# Patient Record
Sex: Male | Born: 1935 | Race: White | Hispanic: No | Marital: Married | Smoking: Former smoker
Health system: Southern US, Community
[De-identification: ages and names within clinical notes are randomized; demographics above are authoritative.]

## PROBLEM LIST (undated history)

## (undated) DIAGNOSIS — S72009A Fracture of unspecified part of neck of unspecified femur, initial encounter for closed fracture: Secondary | ICD-10-CM

## (undated) DIAGNOSIS — M1990 Unspecified osteoarthritis, unspecified site: Secondary | ICD-10-CM

## (undated) DIAGNOSIS — F329 Major depressive disorder, single episode, unspecified: Secondary | ICD-10-CM

## (undated) DIAGNOSIS — D649 Anemia, unspecified: Secondary | ICD-10-CM

## (undated) DIAGNOSIS — F32A Depression, unspecified: Secondary | ICD-10-CM

## (undated) DIAGNOSIS — I639 Cerebral infarction, unspecified: Secondary | ICD-10-CM

## (undated) DIAGNOSIS — E785 Hyperlipidemia, unspecified: Secondary | ICD-10-CM

## (undated) HISTORY — PX: HERNIA REPAIR: SHX51

---

## 2013-03-08 ENCOUNTER — Encounter (HOSPITAL_COMMUNITY): Payer: Self-pay | Admitting: Internal Medicine

## 2013-03-08 ENCOUNTER — Inpatient Hospital Stay (HOSPITAL_COMMUNITY)
Admission: AD | Admit: 2013-03-08 | Discharge: 2013-03-12 | DRG: 481 | Disposition: A | Discharge status: Skilled Nursing Facility | Payer: Medicare Other | Source: Other Acute Inpatient Hospital | Attending: Internal Medicine | Admitting: Internal Medicine

## 2013-03-08 DIAGNOSIS — S72142A Displaced intertrochanteric fracture of left femur, initial encounter for closed fracture: Secondary | ICD-10-CM

## 2013-03-08 DIAGNOSIS — S72142D Displaced intertrochanteric fracture of left femur, subsequent encounter for closed fracture with routine healing: Secondary | ICD-10-CM

## 2013-03-08 DIAGNOSIS — Z79899 Other long term (current) drug therapy: Secondary | ICD-10-CM

## 2013-03-08 DIAGNOSIS — W19XXXA Unspecified fall, initial encounter: Secondary | ICD-10-CM | POA: Diagnosis present

## 2013-03-08 DIAGNOSIS — S72142S Displaced intertrochanteric fracture of left femur, sequela: Secondary | ICD-10-CM

## 2013-03-08 DIAGNOSIS — D72829 Elevated white blood cell count, unspecified: Secondary | ICD-10-CM

## 2013-03-08 DIAGNOSIS — S72143A Displaced intertrochanteric fracture of unspecified femur, initial encounter for closed fracture: Principal | ICD-10-CM | POA: Diagnosis present

## 2013-03-08 DIAGNOSIS — D62 Acute posthemorrhagic anemia: Secondary | ICD-10-CM | POA: Diagnosis not present

## 2013-03-08 DIAGNOSIS — R911 Solitary pulmonary nodule: Secondary | ICD-10-CM

## 2013-03-08 DIAGNOSIS — N135 Crossing vessel and stricture of ureter without hydronephrosis: Secondary | ICD-10-CM | POA: Diagnosis present

## 2013-03-08 DIAGNOSIS — E785 Hyperlipidemia, unspecified: Secondary | ICD-10-CM

## 2013-03-08 HISTORY — DX: Unspecified osteoarthritis, unspecified site: M19.90

## 2013-03-08 HISTORY — DX: Hyperlipidemia, unspecified: E78.5

## 2013-03-08 LAB — TYPE AND SCREEN: ABO/RH(D): A POS

## 2013-03-08 LAB — BASIC METABOLIC PANEL
BUN: 13 mg/dL (ref 6–23)
Chloride: 101 mEq/L (ref 96–112)
GFR calc Af Amer: >90 mL/min (ref >90)
GFR calc non Af Amer: 81 mL/min — ABNORMAL LOW (ref >90)
Potassium: 4.2 mEq/L (ref 3.5–5.1)

## 2013-03-08 LAB — CBC
HCT: 43.9 % (ref 39.0–52.0)
MCHC: 35.3 g/dL (ref 30.0–36.0)
Platelets: 249 10*3/uL (ref 150–400)
RDW: 13.6 % (ref 11.5–15.5)
WBC: 20.8 10*3/uL — ABNORMAL HIGH (ref 4.0–10.5)

## 2013-03-08 LAB — PROTIME-INR: Prothrombin Time: 13.7 seconds (ref 11.6–15.2)

## 2013-03-08 MED ORDER — HYDROCODONE-ACETAMINOPHEN 5-325 MG PO TABS
1.0000 | ORAL_TABLET | Freq: Four times a day (QID) | ORAL | Status: DC | PRN
  Administered 2013-03-08: 1 via ORAL
  Administered 2013-03-11: 2 via ORAL
  Filled 2013-03-08: qty 1
  Filled 2013-03-08: qty 2

## 2013-03-08 MED ORDER — MORPHINE SULFATE 2 MG/ML IJ SOLN
1.0000 mg | INTRAMUSCULAR | Status: DC | PRN
  Administered 2013-03-08: 1 mg via INTRAVENOUS
  Filled 2013-03-08: qty 1

## 2013-03-08 MED ORDER — HEPARIN SODIUM (PORCINE) 5000 UNIT/ML IJ SOLN
5000.0000 [IU] | Freq: Three times a day (TID) | INTRAMUSCULAR | Status: DC
  Administered 2013-03-08 – 2013-03-09 (×2): 5000 [IU] via SUBCUTANEOUS
  Filled 2013-03-08 (×5): qty 1

## 2013-03-08 MED ORDER — SIMVASTATIN 40 MG PO TABS
40.0000 mg | ORAL_TABLET | Freq: Every evening | ORAL | Status: DC
  Administered 2013-03-08 – 2013-03-11 (×3): 40 mg via ORAL
  Filled 2013-03-08 (×5): qty 1

## 2013-03-08 MED ORDER — ADULT MULTIVITAMIN W/MINERALS CH
1.0000 | ORAL_TABLET | Freq: Every day | ORAL | Status: DC
  Administered 2013-03-09 – 2013-03-12 (×4): 1 via ORAL
  Filled 2013-03-08 (×4): qty 1

## 2013-03-08 MED ORDER — SENNOSIDES-DOCUSATE SODIUM 8.6-50 MG PO TABS
1.0000 | ORAL_TABLET | Freq: Every evening | ORAL | Status: DC | PRN
  Filled 2013-03-08: qty 1

## 2013-03-08 MED ORDER — SODIUM CHLORIDE 0.9 % IV SOLN
INTRAVENOUS | Status: DC
  Administered 2013-03-08: 23:00:00 via INTRAVENOUS

## 2013-03-08 NOTE — H&P (Signed)
[facility] History and Physical  Howard Rhodes ZOX:[id number] DOB: 08-14-1931 DOA: 03/08/2013  Referring physician: er PCP: No primary provider on file. - from [facility]: Charlann Boxer (ortho)  Chief Complaint: fall with hip pain  HPI: Howard Rhodes is a 79 y.o. male  Who was working in his yard when he fell down on hip butt.  He immediately has hip pain and was unable to walk.  EMS was called.  He was initially transported to [facility] where he was found to have a intertrochanteric fracture of left proximal femur.  Dr Charlann Boxer was consulted by the ER in [facility] and Hospitalist accepted in transfer.    Patient is currently c/o pain in left hip- worse with any movement.  He denies losing consciousness, or hitting head.  He states he is able to get around with a cane.  No CP or SOB with exertion.  He denies any medical hx more than HLD for which he takes simvastatin occasionally.    Chest x ray done at OSH showed a ? Nodular density in RLL.  With recommendations of 2 view when able.  Lab data is also positive for a WBC count of 23 and a Cr of 1.4   Review of Systems: all systems reviewed, negative unless stated above   Past Medical History  Diagnosis Date  . HLD (hyperlipidemia)   . Arthritis    Past Surgical History  Procedure Laterality Date  . Hernia repair     Social History:  has no tobacco, alcohol, and drug history on file. Lives at home with wife, walks with a cane  No Known Allergies  Family hx: HLD  Prior to Admission medications   Medication Sig Start Date End Date Taking? Authorizing Provider  Multiple Vitamin (MULTIVITAMIN WITH MINERALS) TABS Take 1 tablet by mouth daily.   Yes Historical Provider, MD  naproxen sodium (ANAPROX) 220 MG tablet Take 440 mg by mouth 2 (two) times daily as needed (for pain).   Yes Historical Provider, MD  simvastatin (ZOCOR) 40 MG tablet Take 40 mg by mouth every evening.   Yes Historical Provider, MD   Physical Exam: There were  no vitals filed for this visit.   General:  A+Ox3, NAD  Eyes: wnl  ENT: wnl  Neck: supple  Cardiovascular: rrr  Respiratory: clear anterior  Abdomen: +BS, soft, NT  Skin: few areas of bruising  Musculoskeletal: left leg shortened and rotated  Psychiatric: normal  Neurologic: no focal deficit  Labs on Admission:  Basic Metabolic Panel: No results found for this basename: NA, K, CL, CO2, GLUCOSE, BUN, CREATININE, CALCIUM, MG, PHOS,  in the last 168 hours Liver Function Tests: No results found for this basename: AST, ALT, ALKPHOS, BILITOT, PROT, ALBUMIN,  in the last 168 hours No results found for this basename: LIPASE, AMYLASE,  in the last 168 hours No results found for this basename: AMMONIA,  in the last 168 hours CBC: No results found for this basename: WBC, NEUTROABS, HGB, HCT, MCV, PLT,  in the last 168 hours Cardiac Enzymes: No results found for this basename: CKTOTAL, CKMB, CKMBINDEX, TROPONINI,  in the last 168 hours  BNP (last 3 results) No results found for this basename: PROBNP,  in the last 8760 hours CBG: No results found for this basename: GLUCAP,  in the last 168 hours  Radiological Exams on Admission: No results found.  EKG: Independently reviewed. Sinus tachy  Assessment/Plan Active Problems:   HLD (hyperlipidemia)   Intertrochanteric fracture of left hip  Leukocytosis, unspecified   Lung nodule seen on imaging study   1. Fall with hip fracture- Dr. Charlann Boxer; NPO after midnight- hip fracture pathway  2. leucocytosis ?etiology, possible reactive, no old labs to compare to, check here and if elevated continually will pursue further work up 3. Lung nodule on x ray- will need 2 view 4. HLD- continue home mediations 5. Increased Cr on transfer labs- check here again  Dr. Charlann Boxer- aware of transfer- added to treatment team   Code Status: full Family Communication: wife at bedside Disposition Plan: admit  Time spent: 70 min  Benjamine Mola  [facility] Pager [phone]  If 7PM-7AM, please contact night-coverage [email or web address] Password [facility] 03/08/2013, 8:43 PM

## 2013-03-09 ENCOUNTER — Encounter (HOSPITAL_COMMUNITY): Payer: Self-pay | Admitting: Anesthesiology

## 2013-03-09 ENCOUNTER — Encounter (HOSPITAL_COMMUNITY)
Admission: AD | Disposition: A | Discharge status: Skilled Nursing Facility | Payer: Self-pay | Source: Other Acute Inpatient Hospital | Attending: Internal Medicine

## 2013-03-09 ENCOUNTER — Inpatient Hospital Stay (HOSPITAL_COMMUNITY): Payer: Medicare Other

## 2013-03-09 ENCOUNTER — Encounter (HOSPITAL_COMMUNITY): Payer: Self-pay | Admitting: *Deleted

## 2013-03-09 ENCOUNTER — Inpatient Hospital Stay (HOSPITAL_COMMUNITY): Payer: Medicare Other | Admitting: Anesthesiology

## 2013-03-09 DIAGNOSIS — S72009S Fracture of unspecified part of neck of unspecified femur, sequela: Secondary | ICD-10-CM

## 2013-03-09 HISTORY — PX: FEMUR IM NAIL: SHX1597

## 2013-03-09 LAB — URINALYSIS, ROUTINE W REFLEX MICROSCOPIC
Glucose, UA: NEGATIVE mg/dL
Ketones, ur: NEGATIVE mg/dL
Leukocytes, UA: NEGATIVE
pH: 5 (ref 5.0–8.0)

## 2013-03-09 LAB — URINE MICROSCOPIC-ADD ON

## 2013-03-09 LAB — CBC
HCT: 39.6 % (ref 39.0–52.0)
Hemoglobin: 13.6 g/dL (ref 13.0–17.0)
MCH: 33.2 pg (ref 26.0–34.0)
MCHC: 34.3 g/dL (ref 30.0–36.0)
MCV: 96.6 fL (ref 78.0–100.0)
Platelets: 234 K/uL (ref 150–400)
RBC: 4.1 MIL/uL — ABNORMAL LOW (ref 4.22–5.81)
RDW: 13.9 % (ref 11.5–15.5)
WBC: 15.1 K/uL — ABNORMAL HIGH (ref 4.0–10.5)

## 2013-03-09 LAB — SURGICAL PCR SCREEN
MRSA, PCR: NEGATIVE
Staphylococcus aureus: NEGATIVE

## 2013-03-09 SURGERY — INSERTION, INTRAMEDULLARY ROD, FEMUR
Anesthesia: General | Site: Hip | Laterality: Left | Wound class: Clean

## 2013-03-09 MED ORDER — LACTATED RINGERS IV SOLN: INTRAVENOUS | Status: DC

## 2013-03-09 MED ORDER — LIDOCAINE HCL (CARDIAC) 20 MG/ML IV SOLN
INTRAVENOUS | Status: DC | PRN
  Administered 2013-03-09: 75 mg via INTRAVENOUS

## 2013-03-09 MED ORDER — PHENYLEPHRINE HCL 10 MG/ML IJ SOLN
INTRAMUSCULAR | Status: DC | PRN
  Administered 2013-03-09 (×3): 40 ug via INTRAVENOUS

## 2013-03-09 MED ORDER — CEFAZOLIN SODIUM-DEXTROSE 2-3 GM-% IV SOLR
2.0000 g | INTRAVENOUS | Status: AC
  Administered 2013-03-09: 2 g via INTRAVENOUS

## 2013-03-09 MED ORDER — 0.9 % SODIUM CHLORIDE (POUR BTL) OPTIME
TOPICAL | Status: DC | PRN
  Administered 2013-03-09: 1000 mL

## 2013-03-09 MED ORDER — NEOSTIGMINE METHYLSULFATE 1 MG/ML IJ SOLN
INTRAMUSCULAR | Status: DC | PRN
  Administered 2013-03-09: 1 mg via INTRAVENOUS

## 2013-03-09 MED ORDER — FENTANYL CITRATE 0.05 MG/ML IJ SOLN
INTRAMUSCULAR | Status: DC | PRN
  Administered 2013-03-09 (×3): 50 ug via INTRAVENOUS

## 2013-03-09 MED ORDER — GLYCOPYRROLATE 0.2 MG/ML IJ SOLN
INTRAMUSCULAR | Status: DC | PRN
  Administered 2013-03-09: .1 mg via INTRAVENOUS

## 2013-03-09 MED ORDER — LACTATED RINGERS IV SOLN
INTRAVENOUS | Status: DC | PRN
  Administered 2013-03-09 (×3): via INTRAVENOUS

## 2013-03-09 MED ORDER — LACTATED RINGERS IV SOLN: INTRAVENOUS | Status: DC | PRN

## 2013-03-09 MED ORDER — SUCCINYLCHOLINE CHLORIDE 20 MG/ML IJ SOLN
INTRAMUSCULAR | Status: DC | PRN
  Administered 2013-03-09: 100 mg via INTRAVENOUS

## 2013-03-09 MED ORDER — FENTANYL CITRATE 0.05 MG/ML IJ SOLN: 25.0000 ug | INTRAMUSCULAR | Status: DC | PRN

## 2013-03-09 MED ORDER — ACETAMINOPHEN 10 MG/ML IV SOLN
INTRAVENOUS | Status: DC | PRN
  Administered 2013-03-09: 1000 mg via INTRAVENOUS

## 2013-03-09 MED ORDER — CISATRACURIUM BESYLATE (PF) 10 MG/5ML IV SOLN
INTRAVENOUS | Status: DC | PRN
  Administered 2013-03-09 (×2): 4 mg via INTRAVENOUS

## 2013-03-09 MED ORDER — PROPOFOL 10 MG/ML IV BOLUS
INTRAVENOUS | Status: DC | PRN
  Administered 2013-03-09: 110 mg via INTRAVENOUS

## 2013-03-09 MED ORDER — ONDANSETRON HCL 4 MG/2ML IJ SOLN
INTRAMUSCULAR | Status: DC | PRN
  Administered 2013-03-09 (×2): 2 mg via INTRAVENOUS

## 2013-03-09 SURGICAL SUPPLY — 46 items
BAG ZIPLOCK 12X15 (MISCELLANEOUS) ×2 IMPLANT
BIT DRILL 4.3MMS DISTAL GRDTED (BIT) ×1 IMPLANT
BNDG COHESIVE 6X5 TAN STRL LF (GAUZE/BANDAGES/DRESSINGS) IMPLANT
CLOTH BEACON ORANGE TIMEOUT ST (SAFETY) ×2 IMPLANT
COVER MAYO STAND STRL (DRAPES) ×2 IMPLANT
DRAPE C-ARM 42X72 X-RAY (DRAPES) IMPLANT
DRAPE INCISE IOBAN 66X45 STRL (DRAPES) IMPLANT
DRAPE LG THREE QUARTER DISP (DRAPES) IMPLANT
DRAPE ORTHO SPLIT 77X108 STRL (DRAPES)
DRAPE STERI IOBAN 125X83 (DRAPES) ×2 IMPLANT
DRAPE SURG ORHT 6 SPLT 77X108 (DRAPES) IMPLANT
DRAPE U-SHAPE 47X51 STRL (DRAPES) IMPLANT
DRILL 4.3MMS DISTAL GRADUATED (BIT) ×2
DRSG ADAPTIC 3X8 NADH LF (GAUZE/BANDAGES/DRESSINGS) ×2 IMPLANT
DRSG PAD ABDOMINAL 8X10 ST (GAUZE/BANDAGES/DRESSINGS) IMPLANT
DURAPREP 26ML APPLICATOR (WOUND CARE) ×2 IMPLANT
ELECT REM PT RETURN 9FT ADLT (ELECTROSURGICAL) ×2
ELECTRODE REM PT RTRN 9FT ADLT (ELECTROSURGICAL) ×1 IMPLANT
FACESHIELD LNG OPTICON STERILE (SAFETY) ×2 IMPLANT
GLOVE ORTHO TXT STRL SZ7.5 (GLOVE) ×6 IMPLANT
GLOVE SURG ORTHO 8.5 STRL (GLOVE) ×6 IMPLANT
GOWN STRL NON-REIN LRG LVL3 (GOWN DISPOSABLE) ×4 IMPLANT
GUIDEPIN 3.2X17.5 THRD DISP (PIN) ×6 IMPLANT
GUIDEWIRE BALL NOSE 80CM (WIRE) ×2 IMPLANT
HFN LAG SCREW 10.5MM X 115MM (Orthopedic Implant) ×2 IMPLANT
HFN LH 130 DEG 11MM X 380MM (Orthopedic Implant) ×2 IMPLANT
KIT BASIN OR (CUSTOM PROCEDURE TRAY) ×2 IMPLANT
MANIFOLD NEPTUNE II (INSTRUMENTS) IMPLANT
PACK GENERAL/GYN (CUSTOM PROCEDURE TRAY) ×2 IMPLANT
PACK LOWER EXTREMITY WL (CUSTOM PROCEDURE TRAY) ×2 IMPLANT
PAD CAST 4YDX4 CTTN HI CHSV (CAST SUPPLIES) ×1 IMPLANT
PADDING CAST COTTON 4X4 STRL (CAST SUPPLIES) ×1
POSITIONER SURGICAL ARM (MISCELLANEOUS) ×2 IMPLANT
SCREW ANTI ROTATION 80MM (Screw) ×2 IMPLANT
SCREW BONE CORTICAL 5.0X38 (Screw) ×2 IMPLANT
SCREW BONE CORTICAL 5.0X42 (Screw) ×2 IMPLANT
SCREW DRILL BIT ANIT ROTATION (BIT) ×2 IMPLANT
SCREWDRIVER HEX TIP 3.5MM (MISCELLANEOUS) ×2 IMPLANT
SPONGE GAUZE 4X4 12PLY (GAUZE/BANDAGES/DRESSINGS) IMPLANT
STAPLER VISISTAT (STAPLE) ×4 IMPLANT
STOCKINETTE 8 INCH (MISCELLANEOUS) IMPLANT
SUT VIC AB 0 CT1 36 (SUTURE) ×4 IMPLANT
SUT VIC AB 2-0 CT1 27 (SUTURE) ×2
SUT VIC AB 2-0 CT1 TAPERPNT 27 (SUTURE) ×2 IMPLANT
TAPE CLOTH SURG 4X10 WHT LF (GAUZE/BANDAGES/DRESSINGS) ×2 IMPLANT
TOWEL OR 17X26 10 PK STRL BLUE (TOWEL DISPOSABLE) ×4 IMPLANT

## 2013-03-09 NOTE — Progress Notes (Signed)
[facility] PROGRESS NOTE  Howard Rhodes ZOX:[id number] DOB: 08/07/1931 DOA: 03/08/2013 PCP: No primary provider on file.  Assessment/Plan: 1.  1. Fall with hip fracture-  NPO for surgery. Pain control and IV fluids.  2. leucocytosis ?etiology, possible reactive, no old labs to compare to, rpeat labs show improvement in wbc count. UA negative for infection. CXR negative for pneumonia.  3. HLD- continue home mediations 4. DVT Prophylaxis. Heparin. Sq.     Code Status: full code Family Communication: wife at bedside Disposition Plan: pending PT EVAL after the surgery.    Consultants:  Orthopedic consult.   Procedures:  IM rod placement scheduled for today.   Antibiotics:  none  HPI/Subjective: WANTS TO know when the surgery is going to be done.   Objective: Filed Vitals:   03/08/13 2221 03/09/13 0000 03/09/13 0643 03/09/13 0836  BP: 107/72  118/75 109/70  Pulse: 101  99 95  Temp: 98.7 F (37.1 C)  98.8 F (37.1 C) 98.3 F (36.8 C)  TempSrc: Oral  Oral Oral  Resp: 16 16 16 18   Height: 5\' 5"  (1.651 m)     Weight: 99.791 kg (220 lb)     SpO2: 94% 95% 94% 94%    Intake/Output Summary (Last 24 hours) at 03/09/13 1213 Last data filed at 03/09/13 0600  Gross per 24 hour  Intake 374.17 ml  Output    320 ml  Net  54.17 ml   Filed Weights   03/08/13 2221  Weight: 99.791 kg (220 lb)    Exam:   General:  Alert afebrile comfortable  Cardiovascular: s1s2  Respiratory: CTAB no wheezing or rhonchi  Abdomen: soft NT ND BS+  Musculoskeletal: left hip painful ROM.   Data Reviewed: Basic Metabolic Panel:  Recent Labs Lab 03/08/13 2109  NA 141  K 4.2  CL 101  CO2 22  GLUCOSE 164*  BUN 13  CREATININE 0.83  CALCIUM 9.4   Liver Function Tests: No results found for this basename: AST, ALT, ALKPHOS, BILITOT, PROT, ALBUMIN,  in the last 168 hours No results found for this basename: LIPASE, AMYLASE,  in the last 168 hours No results found for this  basename: AMMONIA,  in the last 168 hours CBC:  Recent Labs Lab 03/08/13 2109 03/09/13 0925  WBC 20.8* 15.1*  HGB 15.5 13.6  HCT 43.9 39.6  MCV 94.2 96.6  PLT 249 234   Cardiac Enzymes: No results found for this basename: CKTOTAL, CKMB, CKMBINDEX, TROPONINI,  in the last 168 hours BNP (last 3 results) No results found for this basename: PROBNP,  in the last 8760 hours CBG: No results found for this basename: GLUCAP,  in the last 168 hours  No results found for this or any previous visit (from the past 240 hour(s)).   Studies: Dg Chest 2 View  03/09/2013   *RADIOLOGY REPORT*  Clinical Data: Follow up of questioned lung nodule  CHEST - 2 VIEW  Comparison: Portable chest x-ray of 03/08/2013  Findings: Although the lungs are not optimally aerated, no persistent lung lesion is seen.  No infiltrate or effusion is noted.  The heart is mildly enlarged.  The bones are osteopenic.  IMPRESSION: No persistent lung lesion is noted.  No active lung disease.   Original Report Authenticated By: [name], M.D.   Dg Hip Portable 1 View Left  03/09/2013   *RADIOLOGY REPORT*  Clinical Data: Left hip fracture, follow-up  PORTABLE LEFT HIP - 1 VIEW  Comparison: Left femur and hip films  of 03/08/2013  Findings: The displaced left intertrochanteric femoral fracture is again noted with avulsion of the lesser trochanter as well.  A pathologic component cannot be excluded with poor margins at the fracture site on the portable film obtained.  However, the previous films do not show a definite pathological involvement, with sharper cortical margins demonstrated.  IMPRESSION: Little change in displaced left femoral intertrochanteric fracture with avulsion of the lesser trochanter.   Original Report Authenticated By: [name], M.D.    Scheduled Meds: . heparin  5,000 Units Subcutaneous Q8H  . multivitamin with minerals  1 tablet Oral Daily  . simvastatin  40 mg Oral QPM   Continuous Infusions: . sodium  chloride 50 mL/hr at 03/09/13 4782    Principal Problem:   Intertrochanteric fracture of left hip Active Problems:   HLD (hyperlipidemia)   Leukocytosis, unspecified   Lung nodule seen on imaging study    Time spent: 25 minutes.     [facility]  [facility] Pager [phone]. If 7PM-7AM, please contact night-coverage at [email or web address], password [facility] 03/09/2013, 12:13 PM  LOS: 1 day

## 2013-03-09 NOTE — Consult Note (Signed)
Reason for Consult:   Left intertrochanteric hip fracture Referring Physician:   ED Physician  Howard Rhodes is an 79 y.o. male.  HPI:   Who was working in his yard yesterday when he fell and landed on his butt / left hip. He instantly had hip pain and was unable to walk or bear weight on the left leg.  EMS was called and he was transported to [facility] where he was found to have a intertrochanteric fracture of left proximal femur. Dr Charlann Boxer was consulted by the ER in [facility] and Hospitalist accepted in transfer to [facility].  Discussion was had with the patient and wife regarding the need for surgery. Risks, benefits and expectations were discussed with the patient. Patient understand the risks, benefits and expectations and wishes to proceed with surgery.    Past Medical History  Diagnosis Date  . HLD (hyperlipidemia)   . Arthritis     Past Surgical History  Procedure Laterality Date  . Hernia repair      History reviewed. No pertinent family history.  Social History:  reports that he has quit smoking. His smoking use included Cigarettes and Cigars. He smoked 0.00 packs per day for 10 years. He does not have any smokeless tobacco history on file. He reports that he does not drink alcohol or use illicit drugs.  Allergies: No Known Allergies   Results for orders placed during the hospital encounter of 03/08/13 (from the past 48 hour(s))  TYPE AND SCREEN     Status: None   Collection Time    03/08/13  9:07 PM      Result Value Range   ABO/RH(D) A POS     Antibody Screen NEG     Sample Expiration 03/11/2013    ABO/RH     Status: None   Collection Time    03/08/13  9:07 PM      Result Value Range   ABO/RH(D) A POS    CBC     Status: Abnormal   Collection Time    03/08/13  9:09 PM      Result Value Range   WBC 20.8 (*) 4.0 - 10.5 K/uL   RBC 4.66  4.22 - 5.81 MIL/uL   Hemoglobin 15.5  13.0 - 17.0 g/dL   HCT 78.2  95.6 - 21.3 %   MCV 94.2  78.0 - 100.0 fL   MCH  33.3  26.0 - 34.0 pg   MCHC 35.3  30.0 - 36.0 g/dL   RDW 08.6  57.8 - 46.9 %   Platelets 249  150 - 400 K/uL  BASIC METABOLIC PANEL     Status: Abnormal   Collection Time    03/08/13  9:09 PM      Result Value Range   Sodium 141  135 - 145 mEq/L   Potassium 4.2  3.5 - 5.1 mEq/L   Chloride 101  96 - 112 mEq/L   CO2 22  19 - 32 mEq/L   Glucose, Bld 164 (*) 70 - 99 mg/dL   BUN 13  6 - 23 mg/dL   Creatinine, Ser 6.29  0.50 - 1.35 mg/dL   Calcium 9.4  8.4 - 52.8 mg/dL   GFR calc non Af Amer 81 (*) >90 mL/min   GFR calc Af Amer >90  >90 mL/min   Comment:            The eGFR has been calculated     using the CKD EPI equation.  This calculation has not been     validated in all clinical     situations.     eGFR's persistently     <90 mL/min signify     possible Chronic Kidney Disease.  PROTIME-INR     Status: None   Collection Time    03/08/13  9:09 PM      Result Value Range   Prothrombin Time 13.7  11.6 - 15.2 seconds   INR 1.06  0.00 - 1.49    Dg Hip Portable 1 View Left  03/09/2013   *RADIOLOGY REPORT*  Clinical Data: Left hip fracture, follow-up  PORTABLE LEFT HIP - 1 VIEW  Comparison: Left femur and hip films of 03/08/2013  Findings: The displaced left intertrochanteric femoral fracture is again noted with avulsion of the lesser trochanter as well.  A pathologic component cannot be excluded with poor margins at the fracture site on the portable film obtained.  However, the previous films do not show a definite pathological involvement, with sharper cortical margins demonstrated.  IMPRESSION: Little change in displaced left femoral intertrochanteric fracture with avulsion of the lesser trochanter.   Original Report Authenticated By: [name], M.D.    Review of Systems  Constitutional: Negative.   HENT: Negative.   Eyes: Negative.   Respiratory: Negative.   Cardiovascular: Negative.   Gastrointestinal: Negative.   Genitourinary: Negative.   Musculoskeletal: Positive  for joint pain.  Skin: Negative.   Neurological: Negative.   Endo/Heme/Allergies: Negative.   Psychiatric/Behavioral: Negative.     Blood pressure 109/70, pulse 95, temperature 98.3 F (36.8 C), temperature source Oral, resp. rate 18, height 5\' 5"  (1.651 m), weight 99.791 kg (220 lb), SpO2 94.00%. Physical Exam  Constitutional: He appears well-developed and well-nourished.  HENT:  Head: Normocephalic and atraumatic.  Mouth/Throat: Oropharynx is clear and moist.  Eyes: Pupils are equal, round, and reactive to light.  Neck: Neck supple. No JVD present. No tracheal deviation present. No thyromegaly present.  Cardiovascular: Normal rate, regular rhythm and intact distal pulses.   Respiratory: Breath sounds normal. No respiratory distress. He has no wheezes.  GI: Soft. There is no tenderness. There is no guarding.  Musculoskeletal:       Left hip: He exhibits decreased range of motion, decreased strength, tenderness and bony tenderness. He exhibits no laceration.  Lymphadenopathy:    He has no cervical adenopathy.  Neurological: He is alert.  Skin: Skin is warm and dry.  Psychiatric: He has a normal mood and affect.    Assessment: Left intertrochanteric femur fracture   Plan: NPO now for surgery later today Plan is for IM rodding of the left intertrochanteric femur fracture If plans to do the surgery today change, we will then allow the patient to eat Appreciate medicine seeing and caring for patient     [name] 03/09/2013, 9:07 AM

## 2013-03-09 NOTE — Brief Op Note (Signed)
03/08/2013 - 03/09/2013  11:29 PM  PATIENT:  Howard Rhodes  79 y.o. male  PRE-OPERATIVE DIAGNOSIS:  left femur fracture, displaced peritroch femur fracture  POST-OPERATIVE DIAGNOSIS:  left femur fracture, displaced peritroch femur fracture  PROCEDURE:  Procedure(s): INTRAMEDULLARY (IM) NAIL FEMORAL (Left), Biomet Affixis Long Nail  SURGEON:  Surgeon(s) and Role:    * Verlee Rossetti, MD - Primary  PHYSICIAN ASSISTANT:   ASSISTANTS: Lanney Gins, PA-C   ANESTHESIA:   general  EBL:  Total I/O In: 2250 [I.V.:2250] Out: 600 [Urine:275; Blood:325]  BLOOD ADMINISTERED:none  DRAINS: none and Gastrostomy Tube   LOCAL MEDICATIONS USED:  NONE  SPECIMEN:  No Specimen  DISPOSITION OF SPECIMEN:  N/A  COUNTS:  YES  TOURNIQUET:  * No tourniquets in log *  DICTATION: .Other Dictation: Dictation Number [id number]  PLAN OF CARE: Admit to inpatient   PATIENT DISPOSITION:  PACU - hemodynamically stable.   Delay start of Pharmacological VTE agent (>24hrs) due to surgical blood loss or risk of bleeding: no

## 2013-03-09 NOTE — Progress Notes (Addendum)
Nutrition Brief Note  RD Consulted due to Hip Fracture Protocol  Body mass index is 36.61 kg/(m^2). Patient meets criteria for Obese based on current BMI.   Current diet order is NPO. Pt reports having a good appetite and eating well PTA. Pt usually weighs 222 lbs and he usually eats 2-3 good meals daily per pt report. Weight on 5/15 was 220 lbs- no significant wt loss. Pt's wife in the room states pt eats everything and anything she cooks. Labs and medications reviewed.   No nutrition interventions warranted at this time. If nutrition issues arise, please re-consult RD.   Ian Malkin RD, LDN Inpatient Clinical Dietitian Pager: [phone] After Hours Pager: [phone]

## 2013-03-09 NOTE — Anesthesia Preprocedure Evaluation (Addendum)
Anesthesia Evaluation  Patient identified by MRN, date of birth, ID band Patient awake    Reviewed: Allergy & Precautions, H&P , NPO status , Patient's Chart, lab work & pertinent test results  Airway Mallampati: II TM Distance: >3 FB Neck ROM: Full    Dental  (+) Edentulous Upper and Edentulous Lower   Pulmonary neg pulmonary ROS, former smoker,  breath sounds clear to auscultation  Pulmonary exam normal       Cardiovascular negative cardio ROS  Rhythm:Regular Rate:Normal     Neuro/Psych negative neurological ROS  negative psych ROS   GI/Hepatic negative GI ROS, Neg liver ROS,   Endo/Other  negative endocrine ROSMorbid obesity  Renal/GU negative Renal ROS  negative genitourinary   Musculoskeletal negative musculoskeletal ROS (+)   Abdominal   Peds  Hematology negative hematology ROS (+)   Anesthesia Other Findings   Reproductive/Obstetrics                          Anesthesia Physical Anesthesia Plan  ASA: II and emergent  Anesthesia Plan: General   Post-op Pain Management:    Induction: Intravenous  Airway Management Planned: Oral ETT  Additional Equipment:   Intra-op Plan:   Post-operative Plan: Extubation in OR  Informed Consent: I have reviewed the patients History and Physical, chart, labs and discussed the procedure including the risks, benefits and alternatives for the proposed anesthesia with the patient or authorized representative who has indicated his/her understanding and acceptance.   Dental advisory given  Plan Discussed with: CRNA  Anesthesia Plan Comments:         Anesthesia Quick Evaluation

## 2013-03-09 NOTE — Progress Notes (Signed)
Clinical Social Work Department BRIEF PSYCHOSOCIAL ASSESSMENT 03/09/2013  Patient:  Howard Rhodes, Howard Rhodes     Account Number:  [id number]     Admit date:  03/08/2013  Clinical Social Worker:  Candie Chroman  Date/Time:  03/09/2013 11:40 AM  Referred by:  Physician  Date Referred:  03/09/2013 Referred for  SNF Placement   Other Referral:   Interview type:  Patient Other interview type:    PSYCHOSOCIAL DATA Living Status:  WIFE Admitted from facility:   Level of care:   Primary support name:  Howard Rhodes Primary support relationship to patient:  SPOUSE Degree of support available:   supportive    CURRENT CONCERNS Current Concerns  Post-Acute Placement   Other Concerns:    SOCIAL WORK ASSESSMENT / PLAN Pt is an 79 yr old gentleman living at home prior to hospitalization. CSW met with pt / spouse to assist with d/c planning. Pt has hip surgery pending. Pt is not sure what his needs will be following surgery. CSW explained options available [facility] Services vs SNF ) pending PT recommendations. SNF list provided . CSW will continue to follow this pt to assist with d/c planning.   Assessment/plan status:  Psychosocial Support/Ongoing Assessment of Needs Other assessment/ plan:   Home vs SNF   Information/referral to community resources:   SNF list provided.    PATIENT'S/FAMILY'S RESPONSE TO PLAN OF CARE: Pt is anxious to have his surgery completed. He will consider all d/c options that are recommended.   Cori Razor  LCSW [phone]

## 2013-03-09 NOTE — Interval H&P Note (Signed)
History and Physical Interval Note:  03/09/2013 8:27 PM  Howard Rhodes  has presented today for surgery, with the diagnosis of right ureteral obstruction  The various methods of treatment have been discussed with the patient and family. After consideration of risks, benefits and other options for treatment, the patient has consented to  Procedure(s): INTRAMEDULLARY (IM) NAIL FEMORAL (Left) as a surgical intervention .  The patient's history has been reviewed, patient examined, no change in status, stable for surgery.  I have reviewed the patient's chart and labs.  Questions were answered to the patient's satisfaction.     Alesana Magistro,STEVEN R

## 2013-03-09 NOTE — Preoperative (Signed)
Beta Blockers   Reason not to administer Beta Blockers:Not Applicable 

## 2013-03-09 NOTE — Transfer of Care (Signed)
Immediate Anesthesia Transfer of Care Note  Patient: Howard Rhodes  Procedure(s) Performed: Procedure(s): INTRAMEDULLARY (IM) NAIL FEMORAL (Left)  Patient Location: PACU  Anesthesia Type:General  Level of Consciousness: awake, alert , patient cooperative, confused and responds to stimulation  Airway & Oxygen Therapy: Patient Spontanous Breathing and Patient connected to face mask oxygen  Post-op Assessment: Report given to PACU RN, Post -op Vital signs reviewed and stable and Patient moving all extremities  Post vital signs: Reviewed and stable  Complications: No apparent anesthesia complications

## 2013-03-09 NOTE — Progress Notes (Signed)
Utilization review completed.  

## 2013-03-10 LAB — CBC
Hemoglobin: 10.9 g/dL — ABNORMAL LOW (ref 13.0–17.0)
MCHC: 32.8 g/dL (ref 30.0–36.0)
MCHC: 34.9 g/dL (ref 30.0–36.0)
Platelets: 206 10*3/uL (ref 150–400)
RBC: 3.28 MIL/uL — ABNORMAL LOW (ref 4.22–5.81)
RDW: 13.5 % (ref 11.5–15.5)

## 2013-03-10 LAB — BASIC METABOLIC PANEL
BUN: 12 mg/dL (ref 6–23)
GFR calc Af Amer: >90 mL/min (ref >90)
GFR calc non Af Amer: 80 mL/min — ABNORMAL LOW (ref >90)
Potassium: 3.9 mEq/L (ref 3.5–5.1)
Sodium: 136 mEq/L (ref 135–145)

## 2013-03-10 LAB — URINE CULTURE: Colony Count: NO GROWTH

## 2013-03-10 MED ORDER — ONDANSETRON HCL 4 MG PO TABS: 4.0000 mg | ORAL_TABLET | Freq: Four times a day (QID) | ORAL | Status: DC | PRN

## 2013-03-10 MED ORDER — SODIUM CHLORIDE 0.9 % IV SOLN
INTRAVENOUS | Status: DC
  Administered 2013-03-10 – 2013-03-11 (×3): via INTRAVENOUS

## 2013-03-10 MED ORDER — METOCLOPRAMIDE HCL 10 MG PO TABS: 5.0000 mg | ORAL_TABLET | Freq: Three times a day (TID) | ORAL | Status: DC | PRN

## 2013-03-10 MED ORDER — ACETAMINOPHEN 325 MG PO TABS: 650.0000 mg | ORAL_TABLET | Freq: Four times a day (QID) | ORAL | Status: DC | PRN

## 2013-03-10 MED ORDER — CEFAZOLIN SODIUM-DEXTROSE 2-3 GM-% IV SOLR
2.0000 g | Freq: Four times a day (QID) | INTRAVENOUS | Status: AC
  Administered 2013-03-10 (×2): 2 g via INTRAVENOUS
  Filled 2013-03-10 (×2): qty 50

## 2013-03-10 MED ORDER — MENTHOL 3 MG MT LOZG
1.0000 | LOZENGE | OROMUCOSAL | Status: DC | PRN
  Filled 2013-03-10: qty 9

## 2013-03-10 MED ORDER — METHOCARBAMOL 500 MG PO TABS: 500.0000 mg | ORAL_TABLET | Freq: Four times a day (QID) | ORAL | Status: DC | PRN

## 2013-03-10 MED ORDER — METOCLOPRAMIDE HCL 5 MG/ML IJ SOLN: 5.0000 mg | Freq: Three times a day (TID) | INTRAMUSCULAR | Status: DC | PRN

## 2013-03-10 MED ORDER — OXYCODONE HCL 5 MG PO TABS: 5.0000 mg | ORAL_TABLET | ORAL | Status: DC | PRN

## 2013-03-10 MED ORDER — ENOXAPARIN SODIUM 40 MG/0.4ML ~~LOC~~ SOLN
40.0000 mg | Freq: Every day | SUBCUTANEOUS | Status: DC
  Administered 2013-03-10 – 2013-03-12 (×3): 40 mg via SUBCUTANEOUS
  Filled 2013-03-10 (×3): qty 0.4

## 2013-03-10 MED ORDER — ACETAMINOPHEN 650 MG RE SUPP: 650.0000 mg | Freq: Four times a day (QID) | RECTAL | Status: DC | PRN

## 2013-03-10 MED ORDER — METHOCARBAMOL 100 MG/ML IJ SOLN
500.0000 mg | Freq: Four times a day (QID) | INTRAMUSCULAR | Status: DC | PRN
  Filled 2013-03-10: qty 5

## 2013-03-10 MED ORDER — ONDANSETRON HCL 4 MG/2ML IJ SOLN: 4.0000 mg | Freq: Four times a day (QID) | INTRAMUSCULAR | Status: DC | PRN

## 2013-03-10 MED ORDER — PHENOL 1.4 % MT LIQD
1.0000 | OROMUCOSAL | Status: DC | PRN
  Filled 2013-03-10: qty 177

## 2013-03-10 MED ORDER — MORPHINE SULFATE 2 MG/ML IJ SOLN: 2.0000 mg | INTRAMUSCULAR | Status: DC | PRN

## 2013-03-10 NOTE — Progress Notes (Signed)
Met with Pt and wife.  Pt and wife stated that Pt will d/c home.  Wife stated that Pt is a "big boy" and that he can decide what he wants to do upon d/c.  CSW thanked Pt and wife for their time.  Providence Crosby, LCSWA Clinical Social Work [phone]

## 2013-03-10 NOTE — Progress Notes (Signed)
Subjective: 1 Day Post-Op Procedure(s) (LRB): INTRAMEDULLARY (IM) NAIL FEMORAL (Left) Patient reports pain as well controlled. Resting well in bed. Denies SOB, CP, or calf pain.  Objective: Vital signs in last 24 hours: Temp:  [98.5 F (36.9 C)-99.9 F (37.7 C)] 99.3 F (37.4 C) (05/17 0350) Pulse Rate:  [98-113] 103 (05/17 0350) Resp:  [16-18] 16 (05/17 0800) BP: (102-131)/(65-93) 120/73 mmHg (05/17 0350) SpO2:  [92 %-100 %] 95 % (05/17 0800)  Intake/Output from previous day: 05/16 0701 - 05/17 0700 In: 2684.2 [I.V.:2634.2; IV Piggyback:50] Out: 1825 [Urine:1500; Blood:325] Intake/Output this shift: Total I/O In: 240 [P.O.:240] Out: -    Recent Labs  03/08/13 2109 03/09/13 0925 03/10/13 0516  HGB 15.5 13.6 11.1*    Recent Labs  03/09/13 0925 03/10/13 0516  WBC 15.1* 15.4*  RBC 4.10* 3.52*  HCT 39.6 33.8*  PLT 234 200    Recent Labs  03/08/13 2109 03/10/13 0516  NA 141 136  K 4.2 3.9  CL 101 99  CO2 22 28  BUN 13 12  CREATININE 0.83 0.85  GLUCOSE 164* 148*  CALCIUM 9.4 8.5    Recent Labs  03/08/13 2109  INR 1.06    Well nourished. Alert and oriented. Left upper leg dressing C/D/I. Left calf soft and non-tender. Left LE neurovascularly intact.   Assessment/Plan: 1 Day Post-Op Procedure(s) (LRB): INTRAMEDULLARY (IM) NAIL FEMORAL (Left) Continue current post-op care.  Check CBC again today.  Jaydalynn Olivero L 03/10/2013, 9:51 AM

## 2013-03-10 NOTE — Care Management Note (Signed)
Cm spoke with patient and spouse at bedside concerning discharge planning. PT eval recommendation for SNF. Per CSW note pt refusing SNf at this time, plans to dc home. Pt's spouse states will agree with decision made by pt. Pt and spouse offered choice of HH agencies for [facility] no choice made at this time. Pt states having RW, cane, & BSC for home DME use. Pt may need more time to decide best appropriate discharge disposition.   Roxy Manns Brooklynne Pereida,RN,BSN [phone]

## 2013-03-10 NOTE — Evaluation (Signed)
Physical Therapy Evaluation Patient Details Name: Howard Rhodes MRN: [id number] DOB: 25-Mar-1931 Today's Date: 03/10/2013 Time: 4098-1191 PT Time Calculation (min): 34 min  PT Assessment / Plan / Recommendation Clinical Impression  Pt s/p fall, hip fx and IM nailing presents with decreased L LE strength/ROM, NWB on L LE, post op pain and fluctuating cognition limiting functional mobility.  Pt currently requires +2 assist to attain standing at bedside and unable to ambulate and comply with NWB on L LE.  Pt would benefit from follow up rehab at SNF level.    PT Assessment  Patient needs continued PT services    Follow Up Recommendations  SNF    Does the patient have the potential to tolerate intense rehabilitation      Barriers to Discharge Inaccessible home environment 5 stairs and NWB on L LE    Equipment Recommendations  None recommended by PT    Recommendations for Other Services OT consult   Frequency Min 3X/week    Precautions / Restrictions Precautions Precautions: Fall Restrictions Weight Bearing Restrictions: Yes LLE Weight Bearing: Non weight bearing   Pertinent Vitals/Pain 3/10 at end of session; RN aware      Mobility  Bed Mobility Bed Mobility: Supine to Sit;Sit to Supine Supine to Sit: 1: +2 Total assist Supine to Sit: Patient Percentage: 30% Sit to Supine: 1: +2 Total assist Sit to Supine: Patient Percentage: 20% Details for Bed Mobility Assistance: Utilized sheet under pt to move to/from EOB with pt having apparent difficulty processing use of R LE to self assist Transfers Transfers: Sit to Stand;Stand to Sit Sit to Stand: 1: +2 Total assist;From bed;With upper extremity assist;From elevated surface Sit to Stand: Patient Percentage: 50% Stand to Sit: 1: +2 Total assist;To bed;With upper extremity assist Stand to Sit: Patient Percentage: 50% Details for Transfer Assistance: cues for LE managemen; NWB on L and use of UEs to self assist.  Pt stood twice  with RW and assist of 2 to attain standing and shift wt to R LE Ambulation/Gait Ambulation/Gait Assistance: Not tested (comment) (Pt unable to comply with NWB in standing - ambulation not at)    Exercises General Exercises - Lower Extremity Ankle Circles/Pumps: AROM;Both;15 reps;Supine Heel Slides: AAROM;10 reps;Supine;Left Hip ABduction/ADduction: AAROM;Left;10 reps;Supine   PT Diagnosis: Difficulty walking  PT Problem List: Decreased strength;Decreased range of motion;Decreased activity tolerance;Decreased balance;Decreased mobility;Obesity;Decreased safety awareness;Decreased knowledge of use of DME;Pain;Decreased cognition PT Treatment Interventions: DME instruction;Gait training;Functional mobility training;Therapeutic activities;Therapeutic exercise;Patient/family education   PT Goals Acute Rehab PT Goals PT Goal Formulation: With patient Time For Goal Achievement: 03/22/13 Potential to Achieve Goals: Fair Pt will go Supine/Side to Sit: with min assist PT Goal: Supine/Side to Sit - Progress: Goal set today Pt will Sit at Edge of Bed: with supervision;3-5 min;with no upper extremity support PT Goal: Sit at Edge Of Bed - Progress: Goal set today Pt will go Sit to Supine/Side: with min assist PT Goal: Sit to Supine/Side - Progress: Goal set today Pt will go Sit to Stand: with min assist PT Goal: Sit to Stand - Progress: Goal set today Pt will go Stand to Sit: with min assist PT Goal: Stand to Sit - Progress: Goal set today Pt will Stand: 1 - 2 min;with min assist PT Goal: Stand - Progress: Goal set today Pt will Ambulate: 1 - 15 feet;with +2 total assist;with rolling walker PT Goal: Ambulate - Progress: Goal set today  Visit Information  Last PT Received On: 03/10/13 Assistance Needed: +2  Subjective Data  Subjective: I need to sit, my L leg hurts Patient Stated Goal: Walk   Prior Functioning  Home Living Lives With: Spouse Available Help at Discharge: Family Type of  Home: House Home Access: Stairs to enter Secretary/administrator of Steps: 5 Entrance Stairs-Rails: Right;Left Home Layout: One level Home Adaptive Equipment: Straight cane;Walker - rolling Prior Function Level of Independence: Independent with assistive device(s) Able to Take Stairs?: Yes Communication Communication: No difficulties    Cognition  Cognition Arousal/Alertness: Awake/alert Behavior During Therapy: Flat affect Overall Cognitive Status: Difficult to assess (Ability to follow cues and answer questions fluctuating) Difficult to assess due to:  (Pt's wife insists pt just "picking with you"  when unable to)    Extremity/Trunk Assessment Right Upper Extremity Assessment RUE ROM/Strength/Tone: [facility] for tasks assessed Left Upper Extremity Assessment LUE ROM/Strength/Tone: [facility] for tasks assessed Right Lower Extremity Assessment RLE ROM/Strength/Tone: [facility] for tasks assessed Left Lower Extremity Assessment LLE ROM/Strength/Tone: Deficits LLE ROM/Strength/Tone Deficits: L Le strength at hip 2-/5 with AAROm at hip to 70 flex and 10 abd - pain ltd Trunk Assessment Trunk Assessment: Normal   Balance Balance Balance Assessed: Yes Static Sitting Balance Static Sitting - Balance Support: Bilateral upper extremity supported Static Sitting - Level of Assistance: 3: Mod assist;4: Min assist Static Sitting - Comment/# of Minutes: 5  End of Session PT - End of Session Equipment Utilized During Treatment: Gait belt Activity Tolerance: Patient limited by pain;Patient limited by fatigue Patient left: in bed;with call bell/phone within reach;with family/visitor present Nurse Communication: Mobility status  GP     Howard Rhodes 03/10/2013, 1:46 PM

## 2013-03-10 NOTE — Op Note (Signed)
Howard Rhodes, Howard Rhodes                 ACCOUNT NO.:  [id number]  MEDICAL RECORD NO.:  [id number]  LOCATION:  1613                         FACILITY:  [facility]  PHYSICIAN:  Almedia Balls. Ranell Patrick, M.D. DATE OF BIRTH:  June 24, 1931  DATE OF PROCEDURE:  03/09/2013 DATE OF DISCHARGE:                              OPERATIVE REPORT   PREOPERATIVE DIAGNOSIS:  Left displaced intertrochanteric fracture.  POSTOPERATIVE DIAGNOSIS:  Left displaced peritrochanteric fracture.  PROCEDURE PERFORMED:  Left hip open reduction and internal fixation including IM nailing using AFFIXUS long nail by [employer].  ATTENDING SURGEON:  Almedia Balls. Ranell Patrick, M.D.  ASSISTANT:  Lanney Gins, PA-C.  ANESTHESIA:  General anesthesia was used.  ESTIMATED BLOOD LOSS:  About 200 mL.  FLUID REPLACEMENT:  1500 mL of crystalloid.  INSTRUMENT COUNTS:  Correct.  COMPLICATIONS:  There were no complications.  ANTIBIOTICS:  Perioperative antibiotics were given.  INDICATIONS:  The patient is an 79 year old male who presents after he suffered a fall yesterday.  The patient presented to the emergency room with a displaced intertrochanteric fracture.  The patient unable to ambulate.  The patient was initially evaluated by Dr. Durene Romans.  I was asked to assume the patient's care as Dr. Charlann Boxer was leaving town. The patient's x-rays indicated a displaced intertrochanteric femur fracture.  I counseled the patient's family regarding the need for surgery to restore alignment of the fracture and stabilize it.  Informed consent was obtained.  DESCRIPTION OF PROCEDURE:  After adequate level of anesthesia was achieved, the patient was positioned in a supine position on a fracture table.  Perineal post utilized.  Right leg placed in modified lithotomy position.  Left leg placed in traction boot.  Longitudinal traction applied internal rotation, and x-rays were obtained under fluoroscopic views indicating severe displacement with flexion of the  proximal fragment and basically 100% displacement.  At this point, this was behaving more like a subtroch or peritrochanteric femur fracture.  After we did a sterile prep and drape and a time-out, we made a lateral incision overlying the proximal femur.  This was to provide access for reduction of the fracture site.  I was able to dissect down over the dorsal aspect of the femur and feel the fracture site.  The proximal fragment was rotated and flexed and we had to use a Cobb elevator to basically lever that down back into position while the remainder of the nailing took place.  We made a longitudinal incision proximal to the proximal femur.  We divided the tensor fascia lata.  We placed a guide pin in the proximal femur through the greater trochanter into the distal femur, placed a ball-tip guidewire and then over reamed with step-cut reamer and then reamed up to 12.5 diameter for an 11 nail, we measured it 38 cm in length.  We introduced the nail again holding the reduction with a reduction tool, introduced the nail across the fracture site.  We then drilled the guide to the appropriate depth.  This was 130-degree fixed angle device.  We went ahead and placed our guide pin up for the lag screw as low as we could on the femoral neck where we could get the  best possible position on the head and centered on the lateral as well. We then placed an anti-rotation pin as well.  We then drilled and then tapped and then placed an 115 mm lag screw into the subchondral bone. As I pulled the anti-rotation screw, I noticed that the femoral neck again flexed indicating instability of the femoral neck.  I went ahead then and pulled our lag screw back a couple of turns and I was able to reduce that femoral neck again back down under flexion into adequate alignment, and then we went ahead and placed an anti-rotation screw in place for the guide pin, and that gave [place] excellent stability of that femoral  neck and then we went ahead and advanced the lag screw, the larger screw back into its position.  We were pleased with that reduction, we went ahead then and removed our insertion handle life we obtained orthogonal views and then we abducted the leg and found the distal interlocking screw holes.  We using freehand technique placed a single distal interlock, which was a 4.5 screw and bicortical.  We then thoroughly irrigated all wounds.  We obtained our final x-rays and then closed with 0-Vicryl in the fascia.  Once we had closed the fascia, we did 0 and 2-0 subcutaneous closure and then staples for skin.  Sterile compressive bandage was applied and the patient was transported safely to the recovery room stretcher in stable condition.     Almedia Balls. Ranell Patrick, M.D.     SRN/MEDQ  D:  03/09/2013  T:  03/10/2013  Job:  [id number]

## 2013-03-10 NOTE — Progress Notes (Signed)
[facility] PROGRESS NOTE  Howard Rhodes ZOX:[id number] DOB: 12/31/1930 DOA: 03/08/2013 PCP: No primary provider on file.  Assessment/Plan: 1.  1. Fall with hip fracture-s/p ORIF of the left hip. PT EVAL, Pain control and IV fluids.  2. leucocytosis ?etiology, possible reactive, no old labs to compare to, rpeat labs show persistent leukocytosis. UA negative for infection. CXR negative for pneumonia. procalcitonin is 0.14. Will observe off antibiotics .  3. HLD- continue home mediations 4. Anemia; normocytic, probably anemia from surgery. Continue to monitor.  5. DVT Prophylaxis. Heparin. Sq.     Code Status: full code Family Communication: wife at bedside Disposition Plan: pending PT EVAL   Consultants:  Orthopedic consult.   Procedures:  IM rod placement / ORIF.   Antibiotics:  none  HPI/Subjective: PAIN is tolerable.    Objective: Filed Vitals:   03/10/13 0150 03/10/13 0256 03/10/13 0350 03/10/13 0800  BP: 123/79 102/70 120/73   Pulse: 102 103 103   Temp:  99 F (37.2 C) 99.3 F (37.4 C)   TempSrc:  Oral Oral   Resp: 18 16 16 16   Height:      Weight:      SpO2: 97% 98% 95% 95%    Intake/Output Summary (Last 24 hours) at 03/10/13 0945 Last data filed at 03/10/13 0900  Gross per 24 hour  Intake 2924.17 ml  Output   1825 ml  Net 1099.17 ml   Filed Weights   03/08/13 2221  Weight: 99.791 kg (220 lb)    Exam:   General:  Alert afebrile comfortable  Cardiovascular: s1s2  Respiratory: CTAB no wheezing or rhonchi  Abdomen: soft NT ND BS+  Musculoskeletal: left hip painful ROM.   Data Reviewed: Basic Metabolic Panel:  Recent Labs Lab 03/08/13 2109 03/10/13 0516  NA 141 136  K 4.2 3.9  CL 101 99  CO2 22 28  GLUCOSE 164* 148*  BUN 13 12  CREATININE 0.83 0.85  CALCIUM 9.4 8.5   Liver Function Tests: No results found for this basename: AST, ALT, ALKPHOS, BILITOT, PROT, ALBUMIN,  in the last 168 hours No results found for this  basename: LIPASE, AMYLASE,  in the last 168 hours No results found for this basename: AMMONIA,  in the last 168 hours CBC:  Recent Labs Lab 03/08/13 2109 03/09/13 0925 03/10/13 0516  WBC 20.8* 15.1* 15.4*  HGB 15.5 13.6 11.1*  HCT 43.9 39.6 33.8*  MCV 94.2 96.6 96.0  PLT 249 234 200   Cardiac Enzymes: No results found for this basename: CKTOTAL, CKMB, CKMBINDEX, TROPONINI,  in the last 168 hours BNP (last 3 results) No results found for this basename: PROBNP,  in the last 8760 hours CBG: No results found for this basename: GLUCAP,  in the last 168 hours  Recent Results (from the past 240 hour(s))  SURGICAL PCR SCREEN     Status: None   Collection Time    03/09/13  2:23 PM      Result Value Range Status   MRSA, PCR NEGATIVE  NEGATIVE Final   Staphylococcus aureus NEGATIVE  NEGATIVE Final   Comment:            The Xpert SA Assay (FDA     approved for NASAL specimens     in patients over 32 years of age),     is one component of     a comprehensive surveillance     program.  Test performance has     been validated by [employer]  [facility] for patients greater     than or equal to 74 year old.     It is not intended     to diagnose infection nor to     guide or monitor treatment.     Studies: Dg Chest 2 View  03/09/2013   *RADIOLOGY REPORT*  Clinical Data: Follow up of questioned lung nodule  CHEST - 2 VIEW  Comparison: Portable chest x-ray of 03/08/2013  Findings: Although the lungs are not optimally aerated, no persistent lung lesion is seen.  No infiltrate or effusion is noted.  The heart is mildly enlarged.  The bones are osteopenic.  IMPRESSION: No persistent lung lesion is noted.  No active lung disease.   Original Report Authenticated By: [name], M.D.   Dg Femur Left  03/10/2013   *RADIOLOGY REPORT*  Clinical Data: Intertrochanteric fracture repair.  LEFT FEMUR - 2 VIEW  Comparison: 03/09/2013 at to 5:44 a.m.  Findings: A series of four fluoroscopic spot images  demonstrate placement intramedullary nail and using AFFIXUS long nail.  Single distal interlocking screw.  Lesser trochanteric fragment noted. Significantly improved alignment after pinning.  IMPRESSION:  1.  Spot images demonstrate successful intramedullary nail fixation of the intertrochanteric fracture.   Original Report Authenticated By: Gaylyn Rong, M.D.   Dg Pelvis Portable  03/10/2013   *RADIOLOGY REPORT*  Clinical Data: Postoperative for intramedullary nail  PORTABLE PELVIS  Comparison: 03/09/2013  Findings: Intramedullary nail and anti-rotation screw span the intertrochanteric fracture.  No complicating feature observed.  Lesser trochanteric separate fragment noted.  IMPRESSION:  1.  Intramedullary nail and anti-rotation screw span the intertrochanteric fracture without complicating feature.   Original Report Authenticated By: Gaylyn Rong, M.D.   Dg Hip Portable 1 View Left  03/09/2013   *RADIOLOGY REPORT*  Clinical Data: Left hip fracture, follow-up  PORTABLE LEFT HIP - 1 VIEW  Comparison: Left femur and hip films of 03/08/2013  Findings: The displaced left intertrochanteric femoral fracture is again noted with avulsion of the lesser trochanter as well.  A pathologic component cannot be excluded with poor margins at the fracture site on the portable film obtained.  However, the previous films do not show a definite pathological involvement, with sharper cortical margins demonstrated.  IMPRESSION: Little change in displaced left femoral intertrochanteric fracture with avulsion of the lesser trochanter.   Original Report Authenticated By: [name], M.D.   Dg C-arm 61-120 Min-no Report  03/09/2013   CLINICAL DATA: surgery   C-ARM 61-120 MINUTES  Fluoroscopy was utilized by the requesting physician.  No radiographic  interpretation.     Scheduled Meds: .  ceFAZolin (ANCEF) IV  2 g Intravenous Q6H  . enoxaparin (LOVENOX) injection  40 mg Subcutaneous Daily  . multivitamin with  minerals  1 tablet Oral Daily  . simvastatin  40 mg Oral QPM   Continuous Infusions: . sodium chloride 50 mL/hr at 03/09/13 0852  . sodium chloride 50 mL/hr at 03/10/13 0119    Principal Problem:   Intertrochanteric fracture of left hip Active Problems:   HLD (hyperlipidemia)   Leukocytosis, unspecified   Lung nodule seen on imaging study    Time spent: 25 minutes.     [facility]  [facility] Pager [phone]. If 7PM-7AM, please contact night-coverage at [email or web address], password [facility] 03/10/2013, 9:45 AM  LOS: 2 days

## 2013-03-11 LAB — PROCALCITONIN: Procalcitonin: 0.2 ng/mL

## 2013-03-11 LAB — BASIC METABOLIC PANEL
BUN: 14 mg/dL (ref 6–23)
Chloride: 100 mEq/L (ref 96–112)
GFR calc Af Amer: >90 mL/min (ref >90)
Potassium: 3.5 mEq/L (ref 3.5–5.1)
Sodium: 135 mEq/L (ref 135–145)

## 2013-03-11 LAB — CBC
HCT: 29.2 % — ABNORMAL LOW (ref 39.0–52.0)
RDW: 13.4 % (ref 11.5–15.5)
WBC: 13.5 10*3/uL — ABNORMAL HIGH (ref 4.0–10.5)

## 2013-03-11 NOTE — Anesthesia Postprocedure Evaluation (Signed)
Anesthesia Post Note  Patient: Howard Rhodes  Procedure(s) Performed: Procedure(s) (LRB): INTRAMEDULLARY (IM) NAIL FEMORAL (Left)  Anesthesia type: General  Patient location: PACU  Post pain: Pain level controlled  Post assessment: Post-op Vital signs reviewed  Last Vitals:  Filed Vitals:   03/11/13 0800  BP:   Pulse:   Temp:   Resp: 18    Post vital signs: Reviewed  Level of consciousness: sedated  Complications: No apparent anesthesia complications

## 2013-03-11 NOTE — Evaluation (Signed)
Occupational Therapy Evaluation Patient Details Name: Howard Rhodes MRN: [id number] DOB: Feb 07, 1931 Today's Date: 03/11/2013 Time: 8295-6213 OT Time Calculation (min): 47 min  OT Assessment / Plan / Recommendation Clinical Impression   79 yo male s/p fall with IM Nail LT femur which is now NWB. Ot to follow acutely. Recommend strongly SNF placement. Pt is unable to sit EOB without LOB. Pt was unable to static stand x3 attempts with bed elevated. Pt will require more assistance than wife can provided safely. Pt and wife requesting home but patient progressing very slowly toward goals. Pt will need extended time to achieve basic mobility.    OT Assessment  Patient needs continued OT Services    Follow Up Recommendations  SNF;Supervision/Assistance - 24 hour (pt is not safe for home d/c )    Barriers to Discharge   wife is 75 yo and is very able to perform ADLS for patient. Pts wife gains satification from being his provider and as she states "spoiling him" Pt however needs plue two assistance in which wife can not provided  Equipment Recommendations  Wheelchair (measurements OT);Wheelchair cushion (measurements OT);3 in 1 bedside comode;Other (comment) (hoyer)    Recommendations for Other Services    Frequency  Min 2X/week    Precautions / Restrictions Precautions Precautions: Fall Restrictions Weight Bearing Restrictions: Yes LLE Weight Bearing: Non weight bearing   Pertinent Vitals/Pain Reports no pain at EOB    ADL  Eating/Feeding: Minimal assistance Where Assessed - Eating/Feeding: Bed level (wife (A)ing - observed breakfast- ) Grooming: Wash/dry face;Teeth care;Moderate assistance Where Assessed - Grooming: Supported sitting (EOB incr time leaning to right side) Toilet Transfer:  (unable to attempt) Equipment Used: Gait belt Transfers/Ambulation Related to ADLs: not appropriate at this time. Pt attempted sit<>Stand x3 with unsuccessful attempts with bed elevated. Pt will need  equipment (hoyer) for all transfers ADL Comments: Pt with wife assistance for everything. Wife needed constant cues to allow patient to attempt task with no assistance. wife providing cues in a directive manner "[name] do what they told you" "Howard Rhodes sit up" Pt with delayed reaction to wifes cues and to all questions. pt unaware of location with delay reports Howard Rhodes. Pt unaware of course of treatment . Pt able to state he fell at home. Pt educated on weight bearing precautions and surgery. Pt 's wife attempting to answer orientation questions for patient and needs cues to allow patient to attempt. Pt demonstrates cognitive deficits , delayed response, delayed motor planning and tremor to bil hands. Pt's motor planning present much like Parkinsonism to describe it more clearly. Pt total (A) for all bed mobility. Pt sitting EOB with BIL UE for support with strong right lean. Pt is unable to release BIL UE without leaning on bed side for support. Pt reports no pain sitting EOB.  pt attempting to place tooth paste in mouth. Wife states "Howard Rhodes use that brush and take your teeth out" Pt attemptiing to brush dentures in mouth. Wife reports at baseline he takes them out and brushes them in the sink. Pt unable to problem solve oral care and PTA was performing independently. (needed Mod v/c to use tooth paste and tooth brush correctly)    OT Diagnosis: Generalized weakness;Cognitive deficits;Acute pain  OT Problem List: Decreased strength;Decreased activity tolerance;Impaired balance (sitting and/or standing);Decreased safety awareness;Decreased knowledge of use of DME or AE;Decreased knowledge of precautions;Decreased coordination;Decreased cognition;Obesity;Pain OT Treatment Interventions: Self-care/ADL training;DME and/or AE instruction;Therapeutic exercise;Therapeutic activities;Cognitive remediation/compensation;Balance training;Patient/family education   OT Goals Acute  Rehab OT Goals OT Goal  Formulation: Patient unable to participate in goal setting Time For Goal Achievement: 03/25/13 Potential to Achieve Goals: Good ADL Goals Pt Will Perform Grooming: with min assist;Sitting, edge of bed;Supported ADL Goal: Grooming - Progress: Goal set today Pt Will Perform Upper Body Bathing: with min assist;Sitting, edge of bed;Supported ADL Goal: Upper Body Bathing - Progress: Goal set today Miscellaneous OT Goals Miscellaneous OT Goal #1: Pt will sit EOB min (A) during adl task OT Goal: Miscellaneous Goal #1 - Progress: Goal set today Miscellaneous OT Goal #2: Pt will static stand total +2 50% at eob for ~2 minutes maintaining NWB LE OT Goal: Miscellaneous Goal #2 - Progress: Goal set today  Visit Information  Last OT Received On: 03/11/13 Assistance Needed: +2 (heavy +2 will need equipment to (A)) PT/OT Co-Evaluation/Treatment: Yes    Subjective Data  Subjective: "My wife is standing right there"- pt's remark to pt asked to take therapist hand for bed mobility Patient Stated Goal: pt and wife want to return home   Prior Functioning     Home Living Lives With: Spouse Available Help at Discharge: Family Type of Home: House Home Access: Stairs to enter Secretary/administrator of Steps: 5 Entrance Stairs-Rails: Right;Left Home Layout: One level Home Adaptive Equipment: Straight cane;Walker - rolling Communication Communication: No difficulties (delayed response) Dominant Hand: Right         Vision/Perception Vision - History Baseline Vision: Wears glasses all the time Vision - Assessment Vision Assessment: Vision not tested   Cognition  Cognition Arousal/Alertness: Awake/alert Behavior During Therapy: Flat affect Overall Cognitive Status: Impaired/Different from baseline Area of Impairment: Orientation;Attention;Memory;Following commands;Safety/judgement;Awareness;Problem solving Orientation Level: Disoriented to;Place;Situation Current Attention Level:  Focused Memory: Decreased short-term memory Following Commands: Follows one step commands inconsistently Safety/Judgement: Decreased awareness of safety;Decreased awareness of deficits Awareness: Emergent;Anticipatory;Intellectual Problem Solving: Slow processing;Difficulty sequencing;Requires verbal cues;Requires tactile cues General Comments: Pt needs repetition to all questioning. Pt looking at therapist with no response. Pt asked can you hear me okay and hearing assessed. Pt with no hearing loss note to ability to respond to questions. Pt with blank stare to questions. Difficult to assess due to:  (no response and wife insisting on cueing and answering)    Extremity/Trunk Assessment Right Upper Extremity Assessment RUE ROM/Strength/Tone: Unable to fully assess;Due to impaired cognition (observed to 90 degrees) RUE Sensation: WFL - Light Touch RUE Coordination: WFL - gross motor Left Upper Extremity Assessment LUE ROM/Strength/Tone: Due to impaired cognition;Unable to fully assess LUE ROM/Strength/Tone Deficits: observed to 90 degrees LUE Sensation: WFL - Light Touch LUE Coordination: WFL - gross motor (wife opening all containers) Trunk Assessment Trunk Assessment: Normal     Mobility Bed Mobility Bed Mobility: Supine to Sit;Sitting - Scoot to Edge of Bed;Sit to Supine Supine to Sit: 1: +1 Total assist;HOB elevated;With rails (wife pushing patient and needed cues not to assist) Sitting - Scoot to Edge of Bed: 1: +1 Total assist (with pad) Sit to Supine: 1: +2 Total assist;HOB flat Sit to Supine: Patient Percentage: 10% Details for Bed Mobility Assistance: Pt not initiating any movement for bed mobility. Pt with tactile cues and verbal cues. PT needed therapist to (A) for sliding BIL LE toward EOB. Pt with pad used to help turn hips for EOB sitting. Pt reaching only with RT UE . Pt with poor sitting balance.  Transfers Transfers: Sit to Stand;Stand to Sit Sit to Stand: 1: +2 Total  assist;From elevated surface;From bed (unable to clear bed surface) Sit  to Stand: Patient Percentage: 10% Details for Transfer Assistance: unsuccessful attempting unable to clear buttock from bed surface. Pt not attempting to use Lt LE during transfer at all     Exercise     Balance Static Sitting Balance Static Sitting - Balance Support: Bilateral upper extremity supported;Feet supported Static Sitting - Level of Assistance: 2: Max assist;3: Mod assist Static Sitting - Comment/# of Minutes: prolonged sitting for ~20 minutes with strong right lean. Pt unsafe to sit EOB without suppport   End of Session OT - End of Session Activity Tolerance: Patient limited by pain;Other (comment) (cognition) Patient left: in bed;with call bell/phone within reach;with family/visitor present Nurse Communication: Mobility status;Need for lift equipment;Precautions;Weight bearing status  GO     Harrel Carina [facility] 03/11/2013, 9:23 AM Pager: [phone]

## 2013-03-11 NOTE — Progress Notes (Signed)
   Subjective: 2 Days Post-Op Procedure(s) (LRB): INTRAMEDULLARY (IM) NAIL FEMORAL (Left) Patient reports pain as mild.   Patient seen in rounds with Dr. Darrelyn Hillock. Patient is well, and has had no acute complaints or problems. No issues overnight. No complaints of shortness of breath or chest pain. Wife speaking for husband during rounds. Reports that he is going home with her and not to SNF as recommended.    Objective: Vital signs in last 24 hours: Temp:  [98.7 F (37.1 C)-100 F (37.8 C)] 98.9 F (37.2 C) (05/18 0509) Pulse Rate:  [98-115] 98 (05/18 0509) Resp:  [16-24] 22 (05/18 0509) BP: (117-135)/(66-76) 135/75 mmHg (05/18 0509) SpO2:  [95 %-98 %] 97 % (05/18 0509)  Intake/Output from previous day:  Intake/Output Summary (Last 24 hours) at 03/11/13 0756 Last data filed at 03/11/13 0520  Gross per 24 hour  Intake   1680 ml  Output   1000 ml  Net    680 ml     Labs:  Recent Labs  03/08/13 2109 03/09/13 0925 03/10/13 0516 03/10/13 1210 03/11/13 0500  HGB 15.5 13.6 11.1* 10.9* 10.1*    Recent Labs  03/10/13 1210 03/11/13 0500  WBC 13.9* 13.5*  RBC 3.28* 3.06*  HCT 31.2* 29.2*  PLT 206 199    Recent Labs  03/10/13 0516 03/11/13 0500  NA 136 135  K 3.9 3.5  CL 99 100  CO2 28 26  BUN 12 14  CREATININE 0.85 0.76  GLUCOSE 148* 135*  CALCIUM 8.5 8.2*    Recent Labs  03/08/13 2109  INR 1.06    EXAM General - Patient is Alert and Oriented Extremity - Dorsiflexion/Plantar flexion intact Incision: dressing C/D/I Dressing/Incision - clean, dry, no drainage Motor Function - intact, moving foot and toes well on exam.   Past Medical History  Diagnosis Date  . HLD (hyperlipidemia)   . Arthritis     Assessment/Plan: 2 Days Post-Op Procedure(s) (LRB): INTRAMEDULLARY (IM) NAIL FEMORAL (Left) Principal Problem:   Intertrochanteric fracture of left hip Active Problems:   HLD (hyperlipidemia)   Leukocytosis, unspecified   Lung nodule seen on  imaging study  Estimated body mass index is 36.61 kg/(m^2) as calculated from the following:   Height as of this encounter: 5\' 5"  (1.651 m).   Weight as of this encounter: 99.791 kg (220 lb). Advance diet Up with therapy Discharge home with home health tomorrow  DVT Prophylaxis - Lovenox  Continue therapy today. Plan for discharge home tomorrow with home health PT.   Monnie Anspach LAUREN 03/11/2013, 7:56 AM

## 2013-03-11 NOTE — Progress Notes (Signed)
[facility] PROGRESS NOTE  Howard Rhodes ZOX:[id number] DOB: [birth year] DOA: 03/08/2013 PCP: No primary provider on file.  Assessment/Plan:  1. Fall with hip fracture-s/p ORIF of the left hip. PT EVAL recommending SNF , plan to discharge tomorrow., Pain control  2. leucocytosis ?etiology, possible reactive, no old labs to compare to, rpeat labs show persistent leukocytosis. UA negative for infection. CXR negative for pneumonia. procalcitonin is 0.14. Will observe off antibiotics .  3. HLD- continue home mediations 4. Anemia; normocytic, probably anemia from surgery. Continue to monitor.  5. DVT Prophylaxis. Heparin. Sq.     Code Status: full code Family Communication: wife at bedside Disposition Plan: SNF in am.   Consultants:  Orthopedic consult.   Procedures:  IM rod placement / ORIF.   Antibiotics:  none  HPI/Subjective: PAIN is tolerable.    Objective: Filed Vitals:   03/11/13 0800 03/11/13 1141 03/11/13 1343 03/11/13 1600  BP:   109/68   Pulse:   86   Temp:   98.5 F (36.9 C)   TempSrc:      Resp: 18 18 16 16   Height:      Weight:      SpO2: 98% 98% 98% 98%    Intake/Output Summary (Last 24 hours) at 03/11/13 1704 Last data filed at 03/11/13 1200  Gross per 24 hour  Intake   1920 ml  Output   1300 ml  Net    620 ml   Filed Weights   03/08/13 2221  Weight: 99.791 kg (220 lb)    Exam:   General:  Alert afebrile comfortable  Cardiovascular: s1s2  Respiratory: CTAB no wheezing or rhonchi  Abdomen: soft NT ND BS+  Musculoskeletal: left hip painful ROM.   Data Reviewed: Basic Metabolic Panel:  Recent Labs Lab 03/08/13 2109 03/10/13 0516 03/11/13 0500  NA 141 136 135  K 4.2 3.9 3.5  CL 101 99 100  CO2 22 28 26   GLUCOSE 164* 148* 135*  BUN 13 12 14   CREATININE 0.83 0.85 0.76  CALCIUM 9.4 8.5 8.2*   Liver Function Tests: No results found for this basename: AST, ALT, ALKPHOS, BILITOT, PROT, ALBUMIN,  in the last 168 hours No  results found for this basename: LIPASE, AMYLASE,  in the last 168 hours No results found for this basename: AMMONIA,  in the last 168 hours CBC:  Recent Labs Lab 03/08/13 2109 03/09/13 0925 03/10/13 0516 03/10/13 1210 03/11/13 0500  WBC 20.8* 15.1* 15.4* 13.9* 13.5*  HGB 15.5 13.6 11.1* 10.9* 10.1*  HCT 43.9 39.6 33.8* 31.2* 29.2*  MCV 94.2 96.6 96.0 95.1 95.4  PLT 249 234 200 206 199   Cardiac Enzymes: No results found for this basename: CKTOTAL, CKMB, CKMBINDEX, TROPONINI,  in the last 168 hours BNP (last 3 results) No results found for this basename: PROBNP,  in the last 8760 hours CBG: No results found for this basename: GLUCAP,  in the last 168 hours  Recent Results (from the past 240 hour(s))  URINE CULTURE     Status: None   Collection Time    03/09/13 10:24 AM      Result Value Range Status   Specimen Description URINE, CATHETERIZED   Final   Special Requests NONE   Final   Culture  Setup Time 03/09/2013 17:56   Final   Colony Count NO GROWTH   Final   Culture NO GROWTH   Final   Report Status 03/10/2013 FINAL   Final  SURGICAL PCR SCREEN  Status: None   Collection Time    03/09/13  2:23 PM      Result Value Range Status   MRSA, PCR NEGATIVE  NEGATIVE Final   Staphylococcus aureus NEGATIVE  NEGATIVE Final   Comment:            The Xpert SA Assay (FDA     approved for NASAL specimens     in patients over 68 years of age),     is one component of     a comprehensive surveillance     program.  Test performance has     been validated by [employer] for patients greater     than or equal to 40 year old.     It is not intended     to diagnose infection nor to     guide or monitor treatment.     Studies: Dg Femur Left  03/10/2013   *RADIOLOGY REPORT*  Clinical Data: Intertrochanteric fracture repair.  LEFT FEMUR - 2 VIEW  Comparison: 03/09/2013 at to 5:44 a.m.  Findings: A series of four fluoroscopic spot images demonstrate placement intramedullary  nail and using AFFIXUS long nail.  Single distal interlocking screw.  Lesser trochanteric fragment noted. Significantly improved alignment after pinning.  IMPRESSION:  1.  Spot images demonstrate successful intramedullary nail fixation of the intertrochanteric fracture.   Original Report Authenticated By: Gaylyn Rong, M.D.   Dg Pelvis Portable  03/10/2013   *RADIOLOGY REPORT*  Clinical Data: Postoperative for intramedullary nail  PORTABLE PELVIS  Comparison: 03/09/2013  Findings: Intramedullary nail and anti-rotation screw span the intertrochanteric fracture.  No complicating feature observed.  Lesser trochanteric separate fragment noted.  IMPRESSION:  1.  Intramedullary nail and anti-rotation screw span the intertrochanteric fracture without complicating feature.   Original Report Authenticated By: Gaylyn Rong, M.D.   Dg C-arm 61-120 Min-no Report  03/09/2013   CLINICAL DATA: surgery   C-ARM 61-120 MINUTES  Fluoroscopy was utilized by the requesting physician.  No radiographic  interpretation.     Scheduled Meds: . enoxaparin (LOVENOX) injection  40 mg Subcutaneous Daily  . multivitamin with minerals  1 tablet Oral Daily  . simvastatin  40 mg Oral QPM   Continuous Infusions: . sodium chloride 50 mL/hr at 03/10/13 2136    Principal Problem:   Intertrochanteric fracture of left hip Active Problems:   HLD (hyperlipidemia)   Leukocytosis, unspecified   Lung nodule seen on imaging study    Time spent: 25 minutes.     [facility]  [facility] Pager [phone]. If 7PM-7AM, please contact night-coverage at [email or web address], password [facility] 03/11/2013, 5:04 PM  LOS: 3 days

## 2013-03-11 NOTE — Care Management Note (Signed)
    Page 1 of 2   03/11/2013     5:27:10 PM   CARE MANAGEMENT NOTE 03/11/2013  Patient:  Howard Rhodes, Howard Rhodes   Account Number:  [id number]  Date Initiated:  03/10/2013  Documentation initiated by:  DAVIS,TYMEEKA  Subjective/Objective Assessment:   79 yo male admitted s/p INTRAMEDULLARY (IM) NAIL FEMORAL (Left). PTA pt from home with spouse     Action/Plan:   SNF vs Home with [facility]   Anticipated DC Date:  03/12/2013   Anticipated DC Plan:  SKILLED NURSING FACILITY  In-house referral  Clinical Social Worker      DC Planning Services  CM consult      Choice offered to / List presented to:  C-1 Patient   DME arranged  NA      DME agency  NA     HH arranged  NA      HH agency  NA   Status of service:  In process, will continue to follow Medicare Important Message given?   (If response is "NO", the following Medicare IM given date fields will be blank) Date Medicare IM given:   Date Additional Medicare IM given:    Discharge Disposition:    Per UR Regulation:    If discussed at Long Length of Stay Meetings, dates discussed:    Comments:  03/11/13 [name] RN,BSN NCM WEEKEND [phone] SPOKE TO PATIENT & SPOUSE ABOUT D/C PLANS.SPOUSE ADAMANTLY SAID "I AM TAKING HIM TO A SNF FACILITY IN THE MORNING",& THOSE ARE HER PLANS.CSW ALREADY NOTIFIED BY STAFF NURSE.  03/10/13 1353 Tymeeka Davis,RN,BSN [phone] Cm spoke with patient and spouse at bedside concerning discharge planning. PT eval recommendation for SNF. Per CSW note pt refusing SNf at this time, plans to dc home. Pt's spouse states will agree with decision made by pt. Pt and spouse offered choice of HH agencies for [facility] no choice made at this time. Pt states having RW, cane, & BSC for home DME use. Pt may need more time to decide best appropriate discharge disposition.

## 2013-03-11 NOTE — Progress Notes (Signed)
Revisited Pt and his wife to discuss SNF again.  Pt and wife agreeable to  SNF and only want [facility].  Noted that [facility] has offered Pt a bed.  Pt and wife happy of this offer.  Wife very involved and asking that CSW please contact her with any changes, questions, concerns.  CSW thanked Pt and wife for their time.  Weekday CSW to follow.  Providence Crosby, LCSWA Clinical Social Work [phone]

## 2013-03-11 NOTE — Progress Notes (Signed)
Orthopedics Progress Note  Subjective: My hip feels better  Objective:  Filed Vitals:   03/11/13 2145  BP: 118/71  Pulse: 92  Temp: 98.4 F (36.9 C)  Resp: 20    General: Awake and alert  Musculoskeletal: left hip dressing clean and dry and intact, mod swelling Neurovascularly intact  Lab Results  Component Value Date   WBC 13.5* 03/11/2013   HGB 10.1* 03/11/2013   HCT 29.2* 03/11/2013   MCV 95.4 03/11/2013   PLT 199 03/11/2013       Component Value Date/Time   NA 135 03/11/2013 0500   K 3.5 03/11/2013 0500   CL 100 03/11/2013 0500   CO2 26 03/11/2013 0500   GLUCOSE 135* 03/11/2013 0500   BUN 14 03/11/2013 0500   CREATININE 0.76 03/11/2013 0500   CALCIUM 8.2* 03/11/2013 0500   GFRNONAA 84* 03/11/2013 0500   GFRAA >90 03/11/2013 0500    Lab Results  Component Value Date   INR 1.06 03/08/2013    Assessment/Plan: POD #2 s/p Procedure(s): INTRAMEDULLARY (IM) NAIL FEMORAL Stable so far. Continue OOB/mobilization Strict NWB as this is more of a peritroch than intertroch fracture Dvt Prophylaxis Will need SNF  Howard Spare R. Ranell Patrick, MD 03/11/2013 10:18 PM

## 2013-03-12 ENCOUNTER — Encounter (HOSPITAL_COMMUNITY): Payer: Self-pay | Admitting: Orthopedic Surgery

## 2013-03-12 DIAGNOSIS — S72009D Fracture of unspecified part of neck of unspecified femur, subsequent encounter for closed fracture with routine healing: Secondary | ICD-10-CM

## 2013-03-12 LAB — CBC
HCT: 28 % — ABNORMAL LOW (ref 39.0–52.0)
Hemoglobin: 9.2 g/dL — ABNORMAL LOW (ref 13.0–17.0)
MCHC: 32.9 g/dL (ref 30.0–36.0)
RBC: 2.91 MIL/uL — ABNORMAL LOW (ref 4.22–5.81)
WBC: 12.4 10*3/uL — ABNORMAL HIGH (ref 4.0–10.5)

## 2013-03-12 MED ORDER — SENNOSIDES-DOCUSATE SODIUM 8.6-50 MG PO TABS: 1.0000 | ORAL_TABLET | Freq: Every evening | ORAL | Status: DC | PRN

## 2013-03-12 MED ORDER — ENOXAPARIN SODIUM 40 MG/0.4ML ~~LOC~~ SOLN: 40.0000 mg | Freq: Every day | SUBCUTANEOUS | Status: DC

## 2013-03-12 MED ORDER — HYDROCODONE-ACETAMINOPHEN 5-325 MG PO TABS: 1.0000 | ORAL_TABLET | Freq: Four times a day (QID) | ORAL | Status: DC | PRN

## 2013-03-12 NOTE — Progress Notes (Signed)
   Subjective: 3 Days Post-Op Procedure(s) (LRB): INTRAMEDULLARY (IM) NAIL FEMORAL (Left)  Pt c/o mild soreness but otherwise doing okay  Patient reports pain as mild.  Objective:   VITALS:   Filed Vitals:   03/12/13 0509  BP: 111/70  Pulse: 108  Temp: 99.4 F (37.4 C)  Resp: 24   Incision healing well Neurologically intact  LABS  Recent Labs  03/10/13 1210 03/11/13 0500 03/12/13 0432  HGB 10.9* 10.1* 9.2*  HCT 31.2* 29.2* 28.0*  WBC 13.9* 13.5* 12.4*  PLT 206 199 229     Recent Labs  03/10/13 0516 03/11/13 0500  NA 136 135  K 3.9 3.5  BUN 12 14  CREATININE 0.85 0.76  GLUCOSE 148* 135*     Assessment/Plan: 3 Days Post-Op Procedure(s) (LRB): INTRAMEDULLARY (IM) NAIL FEMORAL (Left) Stable from orthopedic standpoint for d/c to snf Continue partial weight bearing   Discharge to SNF   [facility], MPAS, PA-C  03/12/2013, 11:52 AM
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
## 2013-03-12 NOTE — Clinical Documentation Improvement (Signed)
Anemia Blood Loss Clarification  THIS DOCUMENT IS NOT A PERMANENT PART OF THE MEDICAL RECORD  RESPOND TO THE THIS QUERY, FOLLOW THE INSTRUCTIONS BELOW:  1. If needed, update documentation for the patient's encounter via the notes activity.  2. Access this query again and click edit on the In [employer].  3. After updating, or not, click F2 to complete all highlighted (required) fields concerning your review. Select "additional documentation in the medical record" OR "no additional documentation provided".  4. Click Sign note button.  5. The deficiency will fall out of your In Basket *Please let us know if you are not able to complete this workflow by phone or e-mail (listed below).        03/12/13  Dear Lavella Hammock Marton Redwood  In an effort to better capture your patient's severity of illness, reflect appropriate length of stay and utilization of resources, a review of the patient medical record has revealed the following indicators.    Based on your clinical judgment, please clarify and document in a progress note and/or discharge summary the clinical condition associated with the following supporting information:  In responding to this query please exercise your independent judgment.  The fact that a query is asked, does not imply that any particular answer is desired or expected.  Pt Post Op H/H=9.2/28.0   Clarification Needed   Please clarify if Post Op H/H can be further specified as one of the diagnoses listed below and document in pn or d/c summary.      Possible Clinical Conditions?   " Expected Acute Blood Loss Anemia  " Acute Blood Loss Anemia " Acute on chronic blood loss anemia  " Other Condition________________  " Cannot Clinically Determine  Risk Factors: (recent surgery, pre op anemia, EBL in OR)  Supporting Information:  hip fracture-  HLD  ORIF/Nailing  Ureter Obstruction  Leukocytosis  Signs and Symptoms   Diagnostics: Component   Hemoglobin HCT  Latest Ref Rng      13.0 - 17.0 g/dL 78.2 - 95.6 %  12/08/863     12:10 PM 10.9 (L) 31.2 (L)  03/11/2013      10.1 (L) 29.2 (L)  03/12/2013      9.2 (L) 28.0 (L)   Treatments: : Serial H&H monitoring Medications ( multivitamin with minerals tablet 1 tablet    Reviewed:  Thank You,  Enis Slipper  RN, BSN, MSN/Inf, CCDS Clinical Documentation Specialist [facility] Dept Pager: [phone] / E-mail: [email or web address]  Health Information Management [place]

## 2013-03-12 NOTE — Discharge Summary (Signed)
Physician Discharge Summary  Howard Rhodes ION:[id number] DOB: [birth year] DOA: 03/08/2013  PCP: No primary provider on file.  Admit date: 03/08/2013 Discharge date: 03/12/2013  Time spent: 40 minutes  Recommendations for Outpatient Follow-up:  1. Follow up with PCP i none week 2. Follow up with orthopedics as recommended 3. Please obtain CBC in one week to check WBC and hemoglobin.   Discharge Diagnoses:  Principal Problem:   Intertrochanteric fracture of left hip Active Problems:   HLD (hyperlipidemia)   Leukocytosis, unspecified   Lung nodule seen on imaging study   Discharge Condition: fair.   Diet recommendation: regular  Filed Weights   03/08/13 2221  Weight: 99.791 kg (220 lb)    History of present illness:  Howard Rhodes is a 79 y.o. male  was working in his yard when he fell down on hip butt. He immediately has hip pain and was unable to walk. EMS was called. He was initially transported to [facility] where he was found to have a intertrochanteric fracture of left proximal femur. Dr Charlann Boxer was consulted by the ER in [facility] and Hospitalist accepted in transfer. He underwent ORIF at [facility] and PT is recommending SNF placement.    Hospital Course:  1. Mechanical Fall with hip fracture-s/p ORIF of the left hip. PT EVAL recommending SNF , pain control and rehabilitation.  Weight bearing recommendations, DVT prophylaxis and follow up  as per ortho.  2.  leucocytosis ?etiology, possible reactive, no old labs to compare to, rpeat labs show persistent leukocytosis. UA negative for infection. CXR negative for pneumonia. procalcitonin is 0.14. He ws not started on antibiotics. Recommend checking CBC in one week to evaluate the slight elevated WBC count.  3. Hyperlipidemia: - continue home mediations   4. Anemia; normocytic, probably anemia from surgery/ expected blood loss anemia. His hemoglobin on admission was around 11. His hemoglobin is around 9 today. Recommend checking  hemoglobin in one week.    Procedures:  Left hip ORIF including IM Nailing using AFFIXUS long nail by [employer].   Consultations: Orthopedics  Discharge Exam: Filed Vitals:   03/11/13 1343 03/11/13 1600 03/11/13 2145 03/12/13 0509  BP: 109/68  118/71 111/70  Pulse: 86  92 108  Temp: 98.5 F (36.9 C)  98.4 F (36.9 C) 99.4 F (37.4 C)  TempSrc:   Oral Oral  Resp: 16 16 20 24   Height:      Weight:      SpO2: 98% 98% 100% 95%   General: Alert afebrile comfortable  Cardiovascular: s1s2  Respiratory: CTAB no wheezing or rhonchi  Abdomen: soft NT ND BS+  Musculoskeletal: left hip painful ROM.     Discharge Instructions      Discharge Orders   Future Orders Complete By Expires     Call MD / Call 911  As directed     Comments:      If you experience chest pain or shortness of breath, CALL 911 and be transported to the hospital emergency room.  If you develope a fever above 101 F, pus (white drainage) or increased drainage or redness at the wound, or calf pain, call your surgeon's office.    Constipation Prevention  As directed     Comments:      Drink plenty of fluids.  Prune juice may be helpful.  You may use a stool softener, such as Colace (over the counter) 100 mg twice a day.  Use MiraLax (over the counter) for constipation as needed.    Diet -  low sodium heart healthy  As directed     Discharge instructions  As directed     Scheduling Instructions:      Continue lovenox injections for 3 weeks    Comments:      Continue partial weight bearing for the next 4 weeks Follow up with Dr. Ranell Patrick is 2 weeks for a recheck and x-rays    Discharge instructions  As directed     Comments:      Follow up with CBC in one week to check hemoglobin and wbc counts.  Follow up with orthopedics as recommended.    Increase activity slowly as tolerated  As directed     Scheduling Instructions:      Activity as tolerated  Partial weight bearing        Medication List    STOP taking  these medications       naproxen sodium 220 MG tablet  Commonly known as:  ANAPROX      TAKE these medications       enoxaparin 40 MG/0.4ML injection  Commonly known as:  LOVENOX  Inject 0.4 mLs (40 mg total) into the skin daily.     HYDROcodone-acetaminophen 5-325 MG per tablet  Commonly known as:  NORCO/VICODIN  Take 1-2 tablets by mouth every 6 (six) hours as needed.     multivitamin with minerals Tabs  Take 1 tablet by mouth daily.     senna-docusate 8.6-50 MG per tablet  Commonly known as:  Senokot-S  Take 1 tablet by mouth at bedtime as needed.     simvastatin 40 MG tablet  Commonly known as:  ZOCOR  Take 40 mg by mouth every evening.       No Known Allergies Follow-up Information   Follow up with NORRIS,STEVEN R, MD. Schedule an appointment as soon as possible for a visit in 2 weeks. [phone])    Contact information:   [address] [place] [address] [phone]        The results of significant diagnostics from this hospitalization (including imaging, microbiology, ancillary and laboratory) are listed below for reference.    Significant Diagnostic Studies: Dg Chest 2 View  03/09/2013   *RADIOLOGY REPORT*  Clinical Data: Follow up of questioned lung nodule  CHEST - 2 VIEW  Comparison: Portable chest x-ray of 03/08/2013  Findings: Although the lungs are not optimally aerated, no persistent lung lesion is seen.  No infiltrate or effusion is noted.  The heart is mildly enlarged.  The bones are osteopenic.  IMPRESSION: No persistent lung lesion is noted.  No active lung disease.   Original Report Authenticated By: [name], M.D.   Dg Femur Left  03/10/2013   *RADIOLOGY REPORT*  Clinical Data: Intertrochanteric fracture repair.  LEFT FEMUR - 2 VIEW  Comparison: 03/09/2013 at to 5:44 a.m.  Findings: A series of four fluoroscopic spot images demonstrate placement intramedullary nail and using AFFIXUS long nail.  Single  distal interlocking screw.  Lesser trochanteric fragment noted. Significantly improved alignment after pinning.  IMPRESSION:  1.  Spot images demonstrate successful intramedullary nail fixation of the intertrochanteric fracture.   Original Report Authenticated By: Gaylyn Rong, M.D.   Dg Pelvis Portable  03/10/2013   *RADIOLOGY REPORT*  Clinical Data: Postoperative for intramedullary nail  PORTABLE PELVIS  Comparison: 03/09/2013  Findings: Intramedullary nail and anti-rotation screw span the intertrochanteric fracture.  No complicating feature observed.  Lesser trochanteric separate fragment noted.  IMPRESSION:  1.  Intramedullary nail and anti-rotation screw span the intertrochanteric fracture without complicating feature.   Original Report Authenticated By: Gaylyn Rong, M.D.   Dg Hip Portable 1 View Left  03/09/2013   *RADIOLOGY REPORT*  Clinical Data: Left hip fracture, follow-up  PORTABLE LEFT HIP - 1 VIEW  Comparison: Left femur and hip films of 03/08/2013  Findings: The displaced left intertrochanteric femoral fracture is again noted with avulsion of the lesser trochanter as well.  A pathologic component cannot be excluded with poor margins at the fracture site on the portable film obtained.  However, the previous films do not show a definite pathological involvement, with sharper cortical margins demonstrated.  IMPRESSION: Little change in displaced left femoral intertrochanteric fracture with avulsion of the lesser trochanter.   Original Report Authenticated By: [name], M.D.   Dg C-arm 61-120 Min-no Report  03/09/2013   CLINICAL DATA: surgery   C-ARM 61-120 MINUTES  Fluoroscopy was utilized by the requesting physician.  No radiographic  interpretation.     Microbiology: Recent Results (from the past 240 hour(s))  URINE CULTURE     Status: None   Collection Time    03/09/13 10:24 AM      Result Value Range Status   Specimen Description URINE, CATHETERIZED   Final   Special  Requests NONE   Final   Culture  Setup Time 03/09/2013 17:56   Final   Colony Count NO GROWTH   Final   Culture NO GROWTH   Final   Report Status 03/10/2013 FINAL   Final  SURGICAL PCR SCREEN     Status: None   Collection Time    03/09/13  2:23 PM      Result Value Range Status   MRSA, PCR NEGATIVE  NEGATIVE Final   Staphylococcus aureus NEGATIVE  NEGATIVE Final   Comment:            The Xpert SA Assay (FDA     approved for NASAL specimens     in patients over 23 years of age),     is one component of     a comprehensive surveillance     program.  Test performance has     been validated by [employer] for patients greater     than or equal to 75 year old.     It is not intended     to diagnose infection nor to     guide or monitor treatment.     Labs: Basic Metabolic Panel:  Recent Labs Lab 03/08/13 2109 03/10/13 0516 03/11/13 0500  NA 141 136 135  K 4.2 3.9 3.5  CL 101 99 100  CO2 22 28 26   GLUCOSE 164* 148* 135*  BUN 13 12 14   CREATININE 0.83 0.85 0.76  CALCIUM 9.4 8.5 8.2*   Liver Function Tests: No results found for this basename: AST, ALT, ALKPHOS, BILITOT, PROT, ALBUMIN,  in the last 168 hours No results found for this basename: LIPASE, AMYLASE,  in the last 168 hours No results found for this basename: AMMONIA,  in the last 168 hours CBC:  Recent Labs Lab 03/09/13 0925 03/10/13 0516 03/10/13 1210 03/11/13 0500 03/12/13 0432  WBC 15.1* 15.4* 13.9* 13.5* 12.4*  HGB 13.6 11.1* 10.9* 10.1* 9.2*  HCT 39.6 33.8* 31.2* 29.2* 28.0*  MCV 96.6 96.0 95.1 95.4 96.2  PLT 234 200 206 199 229   Cardiac Enzymes: No results found for this basename: CKTOTAL, CKMB, CKMBINDEX, TROPONINI,  in the  last 168 hours BNP: BNP (last 3 results) No results found for this basename: PROBNP,  in the last 8760 hours CBG: No results found for this basename: GLUCAP,  in the last 168 hours     Signed:  Woodrow Dulski  [facility] 03/12/2013, 12:15 PM

## 2013-03-13 NOTE — Progress Notes (Signed)
Clinical Social Work Department CLINICAL SOCIAL WORK PLACEMENT NOTE 03/13/2013  Patient:  Howard Rhodes, Howard Rhodes  Account Number:  [id number] Admit date:  03/08/2013  Clinical Social Worker:  Cori Razor, LCSW  Date/time:     Clinical Social Work is seeking post-discharge placement for this patient at the following level of care:   SKILLED NURSING   (*CSW will update this form in Epic as items are completed)   03/09/2013  Patient/family provided with [facility] Department of Clinical Social Work's list of facilities offering this level of care within the geographic area requested by the patient (or if unable, by the patient's family).  03/09/2013  Patient/family informed of their freedom to choose among providers that offer the needed level of care, that participate in Medicare, Medicaid or managed care program needed by the patient, have an available bed and are willing to accept the patient.    Patient/family informed of [facility]' ownership interest in [facility], as well as of the fact that they are under no obligation to receive care at this facility.  PASARR submitted to EDS on 03/09/2013 PASARR number received from EDS on   FL2 transmitted to all facilities in geographic area requested by pt/family on  03/09/2013 FL2 transmitted to all facilities within larger geographic area on   Patient informed that his/her managed care company has contracts with or will negotiate with  certain facilities, including the following:     Patient/family informed of bed offers received:  03/10/2013 Patient chooses bed at [facility] [place] Physician recommends and patient chooses bed at    Patient to be transferred to [facility] [place] on  03/12/2013 Patient to be transferred to facility by [facility]  The following physician request were entered in Epic:   Additional Comments: Cori Razor LCSW [phone]

## 2013-04-13 ENCOUNTER — Emergency Department (HOSPITAL_COMMUNITY): Payer: Medicare Other

## 2013-04-13 ENCOUNTER — Inpatient Hospital Stay (HOSPITAL_COMMUNITY)
Admission: EM | Admit: 2013-04-13 | Discharge: 2013-04-16 | DRG: 194 | Disposition: A | Discharge status: Skilled Nursing Facility | Payer: Medicare Other | Attending: Internal Medicine | Admitting: Internal Medicine

## 2013-04-13 ENCOUNTER — Encounter (HOSPITAL_COMMUNITY): Payer: Self-pay

## 2013-04-13 ENCOUNTER — Inpatient Hospital Stay (HOSPITAL_COMMUNITY): Payer: Medicare Other

## 2013-04-13 DIAGNOSIS — F3289 Other specified depressive episodes: Secondary | ICD-10-CM | POA: Diagnosis present

## 2013-04-13 DIAGNOSIS — E785 Hyperlipidemia, unspecified: Secondary | ICD-10-CM

## 2013-04-13 DIAGNOSIS — F411 Generalized anxiety disorder: Secondary | ICD-10-CM | POA: Diagnosis present

## 2013-04-13 DIAGNOSIS — D72829 Elevated white blood cell count, unspecified: Secondary | ICD-10-CM

## 2013-04-13 DIAGNOSIS — J189 Pneumonia, unspecified organism: Principal | ICD-10-CM

## 2013-04-13 DIAGNOSIS — M129 Arthropathy, unspecified: Secondary | ICD-10-CM | POA: Diagnosis present

## 2013-04-13 DIAGNOSIS — D649 Anemia, unspecified: Secondary | ICD-10-CM | POA: Diagnosis present

## 2013-04-13 DIAGNOSIS — S72142D Displaced intertrochanteric fracture of left femur, subsequent encounter for closed fracture with routine healing: Secondary | ICD-10-CM

## 2013-04-13 DIAGNOSIS — Z87891 Personal history of nicotine dependence: Secondary | ICD-10-CM

## 2013-04-13 DIAGNOSIS — F329 Major depressive disorder, single episode, unspecified: Secondary | ICD-10-CM | POA: Diagnosis present

## 2013-04-13 DIAGNOSIS — J90 Pleural effusion, not elsewhere classified: Secondary | ICD-10-CM | POA: Diagnosis present

## 2013-04-13 DIAGNOSIS — R5381 Other malaise: Secondary | ICD-10-CM | POA: Diagnosis present

## 2013-04-13 HISTORY — DX: Fracture of unspecified part of neck of unspecified femur, initial encounter for closed fracture: S72.009A

## 2013-04-13 HISTORY — DX: Anemia, unspecified: D64.9

## 2013-04-13 HISTORY — DX: Depression, unspecified: F32.A

## 2013-04-13 HISTORY — DX: Major depressive disorder, single episode, unspecified: F32.9

## 2013-04-13 LAB — COMPREHENSIVE METABOLIC PANEL
Albumin: 3.1 g/dL — ABNORMAL LOW (ref 3.5–5.2)
BUN: 9 mg/dL (ref 6–23)
Chloride: 99 mEq/L (ref 96–112)
Creatinine, Ser: 0.77 mg/dL (ref 0.50–1.35)
GFR calc Af Amer: >90 mL/min (ref >90)
GFR calc non Af Amer: 83 mL/min — ABNORMAL LOW (ref >90)
Glucose, Bld: 142 mg/dL — ABNORMAL HIGH (ref 70–99)
Total Bilirubin: 1.4 mg/dL — ABNORMAL HIGH (ref 0.3–1.2)

## 2013-04-13 LAB — CBC
Hemoglobin: 14.2 g/dL (ref 13.0–17.0)
MCHC: 34.1 g/dL (ref 30.0–36.0)
RBC: 4.24 MIL/uL (ref 4.22–5.81)

## 2013-04-13 LAB — CBC WITH DIFFERENTIAL/PLATELET
Basophils Absolute: 0 10*3/uL (ref 0.0–0.1)
Lymphocytes Relative: 13 % (ref 12–46)
Lymphs Abs: 1.7 10*3/uL (ref 0.7–4.0)
Neutro Abs: 10.4 10*3/uL — ABNORMAL HIGH (ref 1.7–7.7)
Neutrophils Relative %: 79 % — ABNORMAL HIGH (ref 43–77)
Platelets: 250 10*3/uL (ref 150–400)
RBC: 4.41 MIL/uL (ref 4.22–5.81)
RDW: 14.2 % (ref 11.5–15.5)
WBC: 13.2 10*3/uL — ABNORMAL HIGH (ref 4.0–10.5)

## 2013-04-13 LAB — BASIC METABOLIC PANEL
GFR calc non Af Amer: 83 mL/min — ABNORMAL LOW (ref >90)
Glucose, Bld: 119 mg/dL — ABNORMAL HIGH (ref 70–99)
Potassium: 3.9 mEq/L (ref 3.5–5.1)
Sodium: 137 mEq/L (ref 135–145)

## 2013-04-13 LAB — MRSA PCR SCREENING: MRSA by PCR: NEGATIVE

## 2013-04-13 LAB — POCT I-STAT TROPONIN I

## 2013-04-13 MED ORDER — DEXTROSE 5 % IV SOLN
1.0000 g | Freq: Once | INTRAVENOUS | Status: AC
  Administered 2013-04-13: 1 g via INTRAVENOUS
  Filled 2013-04-13: qty 1

## 2013-04-13 MED ORDER — MORPHINE SULFATE 2 MG/ML IJ SOLN
1.0000 mg | INTRAMUSCULAR | Status: DC | PRN
  Administered 2013-04-13: 1 mg via INTRAVENOUS
  Filled 2013-04-13: qty 1

## 2013-04-13 MED ORDER — BUPROPION HCL ER (SR) 100 MG PO TB12
100.0000 mg | ORAL_TABLET | Freq: Two times a day (BID) | ORAL | Status: DC
  Administered 2013-04-13 – 2013-04-16 (×7): 100 mg via ORAL
  Filled 2013-04-13 (×8): qty 1

## 2013-04-13 MED ORDER — FERROUS FUMARATE 325 (106 FE) MG PO TABS
1.0000 | ORAL_TABLET | Freq: Two times a day (BID) | ORAL | Status: DC
  Administered 2013-04-13 – 2013-04-16 (×7): 106 mg via ORAL
  Filled 2013-04-13 (×8): qty 1

## 2013-04-13 MED ORDER — ONDANSETRON HCL 4 MG/2ML IJ SOLN: 4.0000 mg | Freq: Four times a day (QID) | INTRAMUSCULAR | Status: DC | PRN

## 2013-04-13 MED ORDER — ACETAMINOPHEN 650 MG RE SUPP: 650.0000 mg | Freq: Four times a day (QID) | RECTAL | Status: DC | PRN

## 2013-04-13 MED ORDER — CITALOPRAM HYDROBROMIDE 10 MG PO TABS
10.0000 mg | ORAL_TABLET | Freq: Every day | ORAL | Status: DC
  Administered 2013-04-13 – 2013-04-16 (×4): 10 mg via ORAL
  Filled 2013-04-13 (×4): qty 1

## 2013-04-13 MED ORDER — LEVOFLOXACIN IN D5W 750 MG/150ML IV SOLN
750.0000 mg | INTRAVENOUS | Status: DC
  Administered 2013-04-13 – 2013-04-16 (×4): 750 mg via INTRAVENOUS
  Filled 2013-04-13 (×4): qty 150

## 2013-04-13 MED ORDER — SODIUM CHLORIDE 0.9 % IV SOLN: INTRAVENOUS | Status: AC

## 2013-04-13 MED ORDER — ACETAMINOPHEN 325 MG PO TABS: 650.0000 mg | ORAL_TABLET | Freq: Four times a day (QID) | ORAL | Status: DC | PRN

## 2013-04-13 MED ORDER — SIMVASTATIN 40 MG PO TABS
40.0000 mg | ORAL_TABLET | Freq: Every evening | ORAL | Status: DC
  Administered 2013-04-13 – 2013-04-15 (×3): 40 mg via ORAL
  Filled 2013-04-13 (×4): qty 1

## 2013-04-13 MED ORDER — SACCHAROMYCES BOULARDII 250 MG PO CAPS
250.0000 mg | ORAL_CAPSULE | Freq: Two times a day (BID) | ORAL | Status: DC
  Administered 2013-04-13 – 2013-04-16 (×7): 250 mg via ORAL
  Filled 2013-04-13 (×8): qty 1

## 2013-04-13 MED ORDER — NITROGLYCERIN 0.4 MG SL SUBL: 0.4000 mg | SUBLINGUAL_TABLET | SUBLINGUAL | Status: DC | PRN

## 2013-04-13 MED ORDER — ONDANSETRON HCL 4 MG PO TABS: 4.0000 mg | ORAL_TABLET | Freq: Four times a day (QID) | ORAL | Status: DC | PRN

## 2013-04-13 MED ORDER — DEXTROSE 5 % IV SOLN
1.0000 g | Freq: Two times a day (BID) | INTRAVENOUS | Status: DC
  Administered 2013-04-13 – 2013-04-16 (×7): 1 g via INTRAVENOUS
  Filled 2013-04-13 (×8): qty 1

## 2013-04-13 MED ORDER — SODIUM CHLORIDE 0.9 % IJ SOLN
3.0000 mL | Freq: Two times a day (BID) | INTRAMUSCULAR | Status: DC
  Administered 2013-04-13 – 2013-04-16 (×4): 3 mL via INTRAVENOUS

## 2013-04-13 MED ORDER — HYDROCODONE-ACETAMINOPHEN 5-325 MG PO TABS: 1.0000 | ORAL_TABLET | Freq: Four times a day (QID) | ORAL | Status: DC | PRN

## 2013-04-13 MED ORDER — VANCOMYCIN HCL 10 G IV SOLR
1250.0000 mg | Freq: Once | INTRAVENOUS | Status: AC
  Administered 2013-04-13: 1250 mg via INTRAVENOUS
  Filled 2013-04-13: qty 1250

## 2013-04-13 MED ORDER — VANCOMYCIN HCL 10 G IV SOLR
1250.0000 mg | Freq: Two times a day (BID) | INTRAVENOUS | Status: DC
  Administered 2013-04-13 – 2013-04-16 (×6): 1250 mg via INTRAVENOUS
  Filled 2013-04-13 (×7): qty 1250

## 2013-04-13 NOTE — Progress Notes (Signed)
Utilization review completed.  

## 2013-04-13 NOTE — ED Notes (Signed)
MD at bedside. 

## 2013-04-13 NOTE — ED Notes (Signed)
Per EMS, pt Woke from sleep around 0100 with left sided chest pain. Given 2 nitro by nursing home which brought pain from a 10 to a 1. Pt. Given 324 ASA by EMS. BP 110/80, P 76, R 18. Denies radiation, SOB, N/V, diaphoresis

## 2013-04-13 NOTE — H&P (Signed)
[facility] History and Physical  Rooney Swails WUJ:[id number] DOB: Apr 02, 1931 DOA: 04/13/2013  Referring physician: ER physician. PCP: Ailene Ravel, MD   Chief Complaint: Chest pain.  HPI: Howard Rhodes is a 79 y.o. male with known history of hyperlipidemia who was recently transferred to rehabilitation after surgery for hip fracture started developing chest pain last 3 days. The pain is more left side increased on deep inspiration. Has mild nonproductive cough. Felt cold but no fever or chills. Patient's pain worsened yesterday and came to the ER. Chest x-ray shows left sided consolidation with parapneumonic effusion. Patient has been admitted for pneumonia. Patient still chest pain happens only when he takes a deep breath allergies chest pain-free. Denies any shortness of breath nausea vomiting diaphoresis or any abdominal pain or diarrhea. Troponin and EKG were unremarkable.  Review of Systems: As presented in the history of presenting illness, rest negative.  Past Medical History  Diagnosis Date  . HLD (hyperlipidemia)   . Arthritis   . Anemia   . Depression   . Hip fracture    Past Surgical History  Procedure Laterality Date  . Hernia repair    . Femur im nail Left 03/09/2013    Procedure: INTRAMEDULLARY (IM) NAIL FEMORAL;  Surgeon: Verlee Rossetti, MD;  Location: [facility];  Service: Orthopedics;  Laterality: Left;   Social History:  reports that he has quit smoking. His smoking use included Cigarettes and Cigars. He smoked 0.00 packs per day for 10 years. He does not have any smokeless tobacco history on file. He reports that he does not drink alcohol or use illicit drugs. Presently at rehabilitation. where does patient live-- Can do ADLs. Can patient participate in ADLs?  Allergies  Allergen Reactions  . Lovenox (Enoxaparin Sodium) Other (See Comments)    unknown    Family History  Problem Relation Age of Onset  . CAD Brother       Prior to Admission medications    Medication Sig Start Date End Date Taking? Authorizing Provider  buPROPion (WELLBUTRIN SR) 100 MG 12 hr tablet Take 100 mg by mouth 2 (two) times daily.   Yes Historical Provider, MD  citalopram (CELEXA) 10 MG tablet Take 10 mg by mouth daily.   Yes Historical Provider, MD  HYDROcodone-acetaminophen (NORCO/VICODIN) 5-325 MG per tablet Take 1-2 tablets by mouth every 6 (six) hours as needed. 03/12/13  Yes Kathlen Mody, MD  Multiple Vitamin (MULTIVITAMIN WITH MINERALS) TABS Take 1 tablet by mouth daily.   Yes Historical Provider, MD  nitroGLYCERIN (NITROSTAT) 0.4 MG SL tablet Place 0.4 mg under the tongue every 5 (five) minutes as needed for chest pain.   Yes Historical Provider, MD  saccharomyces boulardii (FLORASTOR) 250 MG capsule Take 250 mg by mouth 2 (two) times daily.   Yes Historical Provider, MD  simvastatin (ZOCOR) 40 MG tablet Take 40 mg by mouth every evening.   Yes Historical Provider, MD   Physical Exam: Filed Vitals:   04/13/13 0301 04/13/13 0330 04/13/13 0500 04/13/13 0538  BP: 105/67 109/65 125/81 107/70  Pulse: 79 79 81 80  Temp:    98.6 F (37 C)  TempSrc:    Oral  Resp: 18 23 25    Height:    5' 5.5" (1.664 m)  Weight:    92.262 kg (203 lb 6.4 oz)  SpO2: 97% 93% 93% 93%     General:  Well-developed and nourished.  Eyes: Anicteric no pallor.  ENT: No discharge from the ears eyes nose mouth.  Neck: No mass felt.  Cardiovascular: S1-S2 heard.  Respiratory: No rhonchi or crepitations.  Abdomen: Soft nontender bowel sounds present.  Skin: No rash.  Musculoskeletal: No edema.  Psychiatric: Appears normal.  Neurologic: Alert awake oriented to time place and person. Moves all extremities.  Labs on Admission:  Basic Metabolic Panel:  Recent Labs Lab 04/13/13 0303  NA 137  K 3.9  CL 98  CO2 30  GLUCOSE 119*  BUN 9  CREATININE 0.77  CALCIUM 9.6   Liver Function Tests: No results found for this basename: AST, ALT, ALKPHOS, BILITOT, PROT, ALBUMIN,   in the last 168 hours No results found for this basename: LIPASE, AMYLASE,  in the last 168 hours No results found for this basename: AMMONIA,  in the last 168 hours CBC:  Recent Labs Lab 04/13/13 0303  WBC 13.9*  HGB 14.2  HCT 41.6  MCV 98.1  PLT 286   Cardiac Enzymes: No results found for this basename: CKTOTAL, CKMB, CKMBINDEX, TROPONINI,  in the last 168 hours  BNP (last 3 results) No results found for this basename: PROBNP,  in the last 8760 hours CBG: No results found for this basename: GLUCAP,  in the last 168 hours  Radiological Exams on Admission: Dg Chest 2 View  04/13/2013   *RADIOLOGY REPORT*  Clinical Data: Chest pain.  CHEST - 2 VIEW  Comparison: Chest x-ray 03/09/2013.  Findings: Lung volumes are low.  Ill-defined opacity in the left base may reflect developing airspace consolidation.  Small left pleural effusion.  No evidence of pulmonary edema.  Heart size is within normal limits.  Mediastinal contours are unremarkable. Atherosclerosis in the thoracic aorta.  IMPRESSION: 1.  Developing left lower lobe consolidation with small left-sided parapneumonic pleural effusion. 2.  Atherosclerosis.   Original Report Authenticated By: Trudie Reed, M.D.    EKG: Independently reviewed. Normal sinus rhythm.  Assessment/Plan Principal Problem:   HCAP (healthcare-associated pneumonia) Active Problems:   HLD (hyperlipidemia)   1. Pneumonia with parapneumonic effusion - patient has been placed on vancomycin cefepime and Levaquin for health care associated pneumonia. Check CT chest for loculated pleural effusion. 2. Hyperlipidemia - continue home medications. 3. Recent hip surgery.    Code Status: Full code.  Family Communication: Patient's wife at the bedside.  Disposition Plan: Admit to inpatient.    Quentin Shorey N. [facility] Pager [phone].  If 7PM-7AM, please contact night-coverage [email or web address] Password [facility] 04/13/2013, 6:03 AM

## 2013-04-13 NOTE — Progress Notes (Signed)
Clinical Child psychotherapist (CSW) informed that pt and family requesting to meet with CSW. CSW visited room and met with pt, spouse and son Thomasene Lot. Pt son informed CSW that pt and family have decided that they would like for pt to return to [facility]. CSW confirmed with both pt and spouse that they initially wanted pt to return however are now agreeable that pt will need SNF placement at discharge. CSW has left a message with Revonda Standard at [facility] to confirm that pt is able to return at discharge.  Theresia Bough, MSW, Theresia Majors [phone]

## 2013-04-13 NOTE — Progress Notes (Signed)
CM did speak to pt and wife and pt will like to return to [facility] for Rehab. Pt had concerns with the staff at [facility] and wants to speak to the CSW. CM did call CSW Theresia Bough and made her aware of disposition needs. CM will continue to monitor for needs. Gala Lewandowsky, RN,BSN [phone]

## 2013-04-13 NOTE — Progress Notes (Signed)
Patient seen and examined. Admitted after midnight secondary to left side CP, non-productive cough and cold but no frank fever. CXR in ED demonstrated left side infiltrate (high concerns for PNA) and effusion (pareneumonic effusion). Please referred to H&P per Dr. Toniann Fail for further details/info on admission.  Plan:  -continue IV antibiotics for Health care associated PNA -follow CT chest to determine on treatment for effusion (vats vs thoracentesis) -start IS -ask PT to evaluate and treat while in the hospital  Jenesis Suchy [phone]

## 2013-04-13 NOTE — ED Provider Notes (Signed)
History     CSN: [id number]  Arrival date & time 04/13/13  4098   First MD Initiated Contact with Patient 04/13/13 0245      Chief Complaint  Patient presents with  . Chest Pain    Patient is a 79 y.o. male presenting with chest pain. The history is provided by the patient.  Chest Pain Pain location:  L chest Pain quality: sharp   Pain radiates to:  Does not radiate Pain radiates to the back: no   Pain severity:  Moderate Onset quality:  Sudden Timing:  Constant Progression:  Worsening Chronicity:  New Relieved by:  Rest Worsened by:  Deep breathing Associated symptoms: no fever and no shortness of breath     Past Medical History  Diagnosis Date  . HLD (hyperlipidemia)   . Arthritis   . Anemia   . Depression   . Hip fracture     Past Surgical History  Procedure Laterality Date  . Hernia repair    . Femur im nail Left 03/09/2013    Procedure: INTRAMEDULLARY (IM) NAIL FEMORAL;  Surgeon: Verlee Rossetti, MD;  Location: [facility];  Service: Orthopedics;  Laterality: Left;    No family history on file.  History  Substance Use Topics  . Smoking status: Former Smoker -- 10 years    Types: Cigarettes, Cigars  . Smokeless tobacco: Not on file  . Alcohol Use: No      Review of Systems  Constitutional: Negative for fever.  Respiratory: Negative for shortness of breath.   Cardiovascular: Positive for chest pain.  All other systems reviewed and are negative.    Allergies  Lovenox  Home Medications   Current Outpatient Rx  Name  Route  Sig  Dispense  Refill  . buPROPion (WELLBUTRIN SR) 100 MG 12 hr tablet   Oral   Take 100 mg by mouth 2 (two) times daily.         . citalopram (CELEXA) 10 MG tablet   Oral   Take 10 mg by mouth daily.         Marland Kitchen HYDROcodone-acetaminophen (NORCO/VICODIN) 5-325 MG per tablet   Oral   Take 1-2 tablets by mouth every 6 (six) hours as needed.   30 tablet   0   . Multiple Vitamin (MULTIVITAMIN WITH MINERALS) TABS    Oral   Take 1 tablet by mouth daily.         . nitroGLYCERIN (NITROSTAT) 0.4 MG SL tablet   Sublingual   Place 0.4 mg under the tongue every 5 (five) minutes as needed for chest pain.         Marland Kitchen saccharomyces boulardii (FLORASTOR) 250 MG capsule   Oral   Take 250 mg by mouth 2 (two) times daily.         . simvastatin (ZOCOR) 40 MG tablet   Oral   Take 40 mg by mouth every evening.           BP 105/67  Pulse 79  Temp(Src) 97.9 F (36.6 C) (Oral)  Resp 18  SpO2 97%  Physical Exam CONSTITUTIONAL: Well developed/well nourished, elderly HEAD: Normocephalic/atraumatic EYES: EOMI/PERRL ENMT: Mucous membranes moist NECK: supple no meningeal signs CV: S1/S2 noted LUNGS: crackles left base, no apparent distress ABDOMEN: soft, nontender, no rebound or guarding GU:no cva tenderness NEURO: Pt is awake/alert, moves all extremitiesx4 EXTREMITIES: pulses normal, full ROM SKIN: warm, color normal PSYCH: no abnormalities of mood noted  ED Course  Procedures   Labs Reviewed  CBC - Abnormal; Notable for the following:    WBC 13.9 (*)    All other components within normal limits  BASIC METABOLIC PANEL - Abnormal; Notable for the following:    Glucose, Bld 119 (*)    GFR calc non Af Amer 83 (*)    All other components within normal limits  POCT I-STAT TROPONIN I   Pt presents with CP On further history, pain is mostly pleuritic.  He has elevated WBC as well as CXR showing pneumonia with effusion.  This would be health care associated pna given h/o recent admission and also he lives in a nursing home D/w dr Toniann Fail, will admit to medicine   MDM  Nursing notes including past medical history and social history reviewed and considered in documentation xrays reviewed and considered Labs/vital reviewed and considered Previous records reviewed and considered - recent admission noted        Date: 04/13/2013 0249am  Rate: 75  Rhythm: normal sinus rhythm  QRS Axis:  normal  Intervals: normal  ST/T Wave abnormalities: normal  Conduction Disutrbances:none  Narrative Interpretation:   Old EKG Reviewed: unchanged    Joya Gaskins, MD 04/13/13 [phone]

## 2013-04-13 NOTE — Progress Notes (Signed)
Clinical Social Work Department BRIEF PSYCHOSOCIAL ASSESSMENT 04/13/2013  Patient:  Howard Rhodes, Howard Rhodes     Account Number:  [id number]     Admit date:  04/13/2013  Clinical Social Worker:  Lourdes Sledge  Date/Time:  04/13/2013 11:47 AM  Referred by:  RN  Date Referred:  04/13/2013 Referred for  SNF Placement   Other Referral:   Interview type:  Family Other interview type:   CSW completed assessment with pt spouse.    PSYCHOSOCIAL DATA Living Status:  FACILITY [facility] Level of care:  Skilled Nursing Facility Primary support name:  Jourden Gilson [phone] Primary support relationship to patient:  SPOUSE Degree of support available:   Pt spouse actively involved in pt care.    CURRENT CONCERNS Current Concerns  Post-Acute Placement   Other Concerns:    SOCIAL WORK ASSESSMENT / PLAN CSW informed that pt admitted from [facility].    CSW spoke with pt and spouse and confirmed that pt was admitted from [facility]. CSW explored whether the plan would be for pt to return at discharge. Pt and spouse both stated that pt would discharge home when medically ready. CSW explored whether CSW could still assist with placement as a back-up in the instance that pt would need additional PT at discharge. Pt and wife proceeded to state that pt would go home and were not agreeable to SNF as a back-up.    No additional needs, CSW signing off.   Assessment/plan status:  Psychosocial Support/Ongoing Assessment of Needs Other assessment/ plan:   Information/referral to community resources:   No resources needed at this time.    PATIENT'S/FAMILY'S RESPONSE TO PLAN OF CARE: Pt is not oriented x 4. CSW spoke with pt spouse who confirmed she would like for pt to return at discharge. No additional needs. CSW signing off.       Theresia Bough, MSW, Theresia [facility]ors [phone]

## 2013-04-13 NOTE — Progress Notes (Signed)
ANTIBIOTIC CONSULT NOTE - INITIAL  Pharmacy Consult for Vancocin and Maxipime Indication: rule out pneumonia  Allergies  Allergen Reactions  . Lovenox (Enoxaparin Sodium) Other (See Comments)    unknown    Patient Measurements: Height: 5' 5.5" (166.4 cm) Weight: 203 lb 6.4 oz (92.262 kg) IBW/kg (Calculated) : 62.65  Vital Signs: Temp: 98.6 F (37 C) (06/20 0538) Temp src: Oral (06/20 0538) BP: 107/70 mmHg (06/20 0538) Pulse Rate: 80 (06/20 0538)  Labs:  Recent Labs  04/13/13 0303  WBC 13.9*  HGB 14.2  PLT 286  CREATININE 0.77   Estimated Creatinine Clearance: 77.6 ml/min (by C-G formula based on Cr of 0.77).  Microbiology: No results found for this or any previous visit (from the past 720 hour(s)).  Medical History: Past Medical History  Diagnosis Date  . HLD (hyperlipidemia)   . Arthritis   . Anemia   . Depression   . Hip fracture     Medications:  Prescriptions prior to admission  Medication Sig Dispense Refill  . buPROPion (WELLBUTRIN SR) 100 MG 12 hr tablet Take 100 mg by mouth 2 (two) times daily.      . citalopram (CELEXA) 10 MG tablet Take 10 mg by mouth daily.      Marland Kitchen HYDROcodone-acetaminophen (NORCO/VICODIN) 5-325 MG per tablet Take 1-2 tablets by mouth every 6 (six) hours as needed.  30 tablet  0  . Multiple Vitamin (MULTIVITAMIN WITH MINERALS) TABS Take 1 tablet by mouth daily.      . nitroGLYCERIN (NITROSTAT) 0.4 MG SL tablet Place 0.4 mg under the tongue every 5 (five) minutes as needed for chest pain.      Marland Kitchen saccharomyces boulardii (FLORASTOR) 250 MG capsule Take 250 mg by mouth 2 (two) times daily.      . simvastatin (ZOCOR) 40 MG tablet Take 40 mg by mouth every evening.       Scheduled:  . buPROPion  100 mg Oral BID  . ceFEPime (MAXIPIME) IV  1 g Intravenous Q12H  . citalopram  10 mg Oral Daily  . saccharomyces boulardii  250 mg Oral BID  . simvastatin  40 mg Oral QPM  . sodium chloride  3 mL Intravenous Q12H  . vancomycin  1,250 mg  Intravenous Once  . vancomycin  1,250 mg Intravenous Q12H   Infusions:  . sodium chloride      Assessment: 79yo male c/o being awoken from sleep w/ left-sided CP, relieved w/ NTG by NH staff, CXR shows developing LLL consolidation, to begin IV ABX for possible PNA.  Goal of Therapy:  Vancomycin trough level 15-20 mcg/ml  Plan:  Will begin vancomycin 1250mg  IV Q12H and cefepime 1g IV Q12H and monitor CBC, Cx, levels prn.  Vernard Gambles, PharmD, BCPS  04/13/2013,6:09 AM

## 2013-04-13 NOTE — ED Notes (Signed)
Pt reports sudden onset of 8/10 sharp left sided CP at 0100 tonight that awoke him from his sleep. Pt denied radiation, SOB, N/V, diaphoresis. Pt given 1 nitro which relieved pain to 6/10. NAD noted. Family at bedside.

## 2013-04-14 ENCOUNTER — Inpatient Hospital Stay (HOSPITAL_COMMUNITY): Payer: Medicare Other

## 2013-04-14 DIAGNOSIS — S72009D Fracture of unspecified part of neck of unspecified femur, subsequent encounter for closed fracture with routine healing: Secondary | ICD-10-CM

## 2013-04-14 LAB — CBC
Hemoglobin: 13.9 g/dL (ref 13.0–17.0)
MCH: 32.6 pg (ref 26.0–34.0)
RBC: 4.27 MIL/uL (ref 4.22–5.81)

## 2013-04-14 LAB — BASIC METABOLIC PANEL
Chloride: 99 mEq/L (ref 96–112)
GFR calc Af Amer: >90 mL/min (ref >90)
GFR calc non Af Amer: 84 mL/min — ABNORMAL LOW (ref >90)
Glucose, Bld: 141 mg/dL — ABNORMAL HIGH (ref 70–99)
Potassium: 3.6 mEq/L (ref 3.5–5.1)
Sodium: 135 mEq/L (ref 135–145)

## 2013-04-14 MED ORDER — HEPARIN SODIUM (PORCINE) 5000 UNIT/ML IJ SOLN
5000.0000 [IU] | Freq: Three times a day (TID) | INTRAMUSCULAR | Status: DC
  Administered 2013-04-14 – 2013-04-16 (×6): 5000 [IU] via SUBCUTANEOUS
  Filled 2013-04-14 (×9): qty 1

## 2013-04-14 NOTE — Progress Notes (Addendum)
[facility] PROGRESS NOTE  Howard Rhodes ZOX:[id number] DOB: 15-Mar-1931 DOA: 04/13/2013 PCP: Howard Ravel, MD  Assessment/Plan: 1-HCAP and small pleural effusion: continue current antibiotics; patient improving. Still with some low grade fever. -follow WBC's trend -IS -CT showing just small pleural effusion, no loculation and no need for thoracentesis   2-Recent left Hip fracture: appears to be stable. Ortho call for rec's regarding weight bearing, healing stage and further eval/treatment as needed  3-HLD: continue statins  4-Depression/anxiety: continue bupropion and celexa  DVT: heparin  Code Status: Full Family Communication: no family at bedside Disposition Plan: back to SNF at discharge   Consultants:  Ortho (Dr. Jillyn Rhodes)  Procedures:  See x-ray reports for details  Antibiotics:  Cefepime  vanc  levaquin  HPI/Subjective: Low grade fever, scattered cough, but overall feeling better.  Objective: Filed Vitals:   04/13/13 0538 04/13/13 1337 04/13/13 2028 04/14/13 0507  BP: 107/70 122/80 129/79 122/70  Pulse: 80 102 100 82  Temp: 98.6 F (37 C) 99.1 F (37.3 C) 99.8 F (37.7 C) 99.2 F (37.3 C)  TempSrc: Oral Oral Oral Oral  Resp:  22    Height: 5' 5.5" (1.664 m)     Weight: 92.262 kg (203 lb 6.4 oz)   89.041 kg (196 lb 4.8 oz)  SpO2: 93% 90% 90% 93%    Intake/Output Summary (Last 24 hours) at 04/14/13 0906 Last data filed at 04/14/13 0900  Gross per 24 hour  Intake    363 ml  Output    550 ml  Net   -187 ml   Filed Weights   04/13/13 0538 04/14/13 0507  Weight: 92.262 kg (203 lb 6.4 oz) 89.041 kg (196 lb 4.8 oz)    Exam:   General:  NAD, low grade fever  Cardiovascular: S1 and S2, no rubs or gallops  Respiratory: scattered rhonchi, otherwise clear.  Abdomen: soft, NT, ND, positive BS  Musculoskeletal: no swelling, no erythema, decrease range of motion on his left hip  Data Reviewed: Basic Metabolic Panel:  Recent Labs Lab  04/13/13 0303 04/13/13 0845  NA 137 138  K 3.9 4.1  CL 98 99  CO2 30 27  GLUCOSE 119* 142*  BUN 9 9  CREATININE 0.77 0.77  CALCIUM 9.6 9.1   Liver Function Tests:  Recent Labs Lab 04/13/13 0845  AST 15  ALT 10  ALKPHOS 101  BILITOT 1.4*  PROT 6.7  ALBUMIN 3.1*   CBC:  Recent Labs Lab 04/13/13 0303 04/13/13 0845  WBC 13.9* 13.2*  NEUTROABS  --  10.4*  HGB 14.2 14.4  HCT 41.6 43.6  MCV 98.1 98.9  PLT 286 250     Recent Results (from the past 240 hour(s))  MRSA PCR SCREENING     Status: None   Collection Time    04/13/13  6:04 AM      Result Value Range Status   MRSA by PCR NEGATIVE  NEGATIVE Final   Comment:            The GeneXpert MRSA Assay (FDA     approved for NASAL specimens     only), is one component of a     comprehensive MRSA colonization     surveillance program. It is not     intended to diagnose MRSA     infection nor to guide or     monitor treatment for     MRSA infections.     Studies: Dg Chest 2 View  04/13/2013   *RADIOLOGY  REPORT*  Clinical Data: Chest pain.  CHEST - 2 VIEW  Comparison: Chest x-ray 03/09/2013.  Findings: Lung volumes are low.  Ill-defined opacity in the left base may reflect developing airspace consolidation.  Small left pleural effusion.  No evidence of pulmonary edema.  Heart size is within normal limits.  Mediastinal contours are unremarkable. Atherosclerosis in the thoracic aorta.  IMPRESSION: 1.  Developing left lower lobe consolidation with small left-sided parapneumonic pleural effusion. 2.  Atherosclerosis.   Original Report Authenticated By: Trudie Reed, M.D.   Ct Chest Wo Contrast  04/13/2013   *RADIOLOGY REPORT*  Clinical Data: Pleural effusions  CT CHEST WITHOUT CONTRAST  Technique:  Multidetector CT imaging of the chest was performed following the standard protocol without IV contrast.  Comparison: Plain films 03/26/2013  Findings: No axillary supraclavicular lymphadenopathy.  No mediastinal or hilar  lymphadenopathy.  No pericardial fluid. Esophagus is normal.  Review of the lung parenchyma demonstrates small bilateral pleural effusions.  There is mild bilateral interlobular septal thickening. There is a focus of air space disease within the lingula (image 27).  Upper lobes are clear.  No pneumothorax.  Limited view of the upper abdomen is unremarkable.  Limited view of the skeleton is unremarkable.  IMPRESSION:  1.  Small focus of atelectasis versus infiltrate within the lingula. Recommend clinical correlation for pulmonary infection. 2.  Small bilateral pleural effusions.   Original Report Authenticated By: Genevive Bi, M.D.    Scheduled Meds: . buPROPion  100 mg Oral BID  . ceFEPime (MAXIPIME) IV  1 g Intravenous Q12H  . citalopram  10 mg Oral Daily  . ferrous fumarate  1 tablet Oral BID  . levofloxacin (LEVAQUIN) IV  750 mg Intravenous Q24H  . saccharomyces boulardii  250 mg Oral BID  . simvastatin  40 mg Oral QPM  . sodium chloride  3 mL Intravenous Q12H  . vancomycin  1,250 mg Intravenous Q12H   Continuous Infusions:   Principal Problem:   HCAP (healthcare-associated pneumonia) Active Problems:   HLD (hyperlipidemia)   Time spent:  >30 minutes   Howard Rhodes  [facility] Pager [phone]. If 7PM-7AM, please contact night-coverage at [email or web address], password [facility] 04/14/2013, 9:06 AM  LOS: 1 day

## 2013-04-14 NOTE — Evaluation (Addendum)
Physical Therapy Evaluation Patient Details Name: Howard Rhodes MRN: [id number] DOB: 20-Nov-1930 Today's Date: 04/14/2013 Time: 3244-0102 PT Time Calculation (min): 19 min  PT Assessment / Plan / Recommendation Clinical Impression  Patient is an 79 y.o. male who was receiving rehabilitation for an IM nailing s/p hip fracture LLE.  Patient experienced chest pain and was brought to [facility].  Orders received for evaluation. Spoke with MD regarding patient's prior Hip repair, and current WBing limitations. Limited evaluation performed at this time while awaiting clarification. Pt currently still requires significant amount of assist for basic mobility such as bed mobility and strength testing. Feel that despite WBing status, patient will still required continued skilled PT to progress strength and mobility. Agree with recommendation back to SNF for further rehabilitation. Will continue to see as indicated.    PT Assessment  Patient needs continued PT services    Follow Up Recommendations  SNF                Frequency Min 2X/week    Precautions / Restrictions Restrictions Weight Bearing Restrictions: Yes LLE Weight Bearing: Partial weight bearing   Pertinent Vitals/Pain 4/10      Mobility  Bed Mobility Bed Mobility: Rolling Right;Rolling Left;Scooting to HOB Rolling Right: 3: Mod assist;With rail Rolling Left: 4: Min assist;With rail Scooting to HOB: 1: +2 Total assist;With rail Scooting to [facility]: Patient Percentage: 60% Details for Bed Mobility Assistance: Patient requires VCs for body positioning and sequencing for bed mobility. Patient was able to use UE to assist with scooting [facility]  Transfers Transfers: Not assessed Ambulation/Gait Ambulation/Gait Assistance: Not tested (comment) General Gait Details: awaiting WBing clarification         PT Diagnosis: Difficulty walking;Generalized weakness;Acute pain  PT Problem List: Decreased strength;Decreased range of motion;Decreased  activity tolerance;Decreased balance;Decreased mobility;Pain PT Treatment Interventions: DME instruction;Gait training;Functional mobility training;Therapeutic activities;Therapeutic exercise;Balance training;Patient/family education   PT Goals Acute Rehab PT Goals PT Goal Formulation: With patient Time For Goal Achievement: 04/28/13 Potential to Achieve Goals: Fair Pt will go Supine/Side to Sit: with modified independence PT Goal: Supine/Side to Sit - Progress: Goal set today Pt will go Sit to Stand: with modified independence PT Goal: Sit to Stand - Progress: Goal set today Pt will Ambulate: 16 - 50 feet;with supervision PT Goal: Ambulate - Progress: Goal set today  Visit Information  Last PT Received On: 04/14/13 Reason Eval/Treat Not Completed: Other (comment) (awaiting orders re:Wbing status 2/2 hip fx with IM nailing)    Subjective Data  Subjective: I don't really want to go back to the facility Patient Stated Goal: to go home   Prior Functioning  Home Living Available Help at Discharge: Skilled Nursing Facility (pre arrangements to return to [facility] per chart) Additional Comments: Patient was at [facility] for rehab s/p hip sx, will return to [facility] per chart Prior Function Level of Independence: Needs assistance (secondary to prior hip fx (03/09/13))    Cognition  Cognition Arousal/Alertness: Awake/alert Behavior During Therapy: WFL for tasks assessed/performed Overall Cognitive Status: No family/caregiver present to determine baseline cognitive functioning Area of Impairment: Safety/judgement;Problem solving Safety/Judgement: Decreased awareness of safety;Decreased awareness of deficits Problem Solving: Slow processing;Difficulty sequencing;Requires verbal cues;Requires tactile cues General Comments: Patient feels he will be strong enough to go home despite his inability to perform minimal bed mobility without assistance    Extremity/Trunk Assessment Right Upper Extremity  Assessment RUE ROM/Strength/Tone: [facility] for tasks assessed RUE Sensation: WFL - Light Touch;WFL - Proprioception Left Upper Extremity Assessment LUE ROM/Strength/Tone: [facility]  for tasks assessed LUE Sensation: WFL - Light Touch;WFL - Proprioception Right Lower Extremity Assessment RLE ROM/Strength/Tone: Deficits (generalized weakness) RLE ROM/Strength/Tone Deficits: gross weakness 3+/5 Left Lower Extremity Assessment LLE ROM/Strength/Tone: Deficits;Unable to fully assess;Due to pain;Due to precautions LLE ROM/Strength/Tone Deficits: awaiting wbing clarification      End of Session PT - End of Session Activity Tolerance: Patient tolerated treatment well Patient left: in bed;with call bell/phone within reach;with family/visitor present Nurse Communication: Mobility status;Weight bearing status  GP     Fabio Asa 04/14/2013, 12:57 PM Charlotte Crumb, PT DPT  [phone]

## 2013-04-14 NOTE — Consult Note (Signed)
  Per Dr. Ranell Patrick from discharge summary last patient is to be nonweight bearing To to fracture type. Probably for 2 more weeks. Will get Xrays and update.

## 2013-04-14 NOTE — Progress Notes (Signed)
PT Cancellation Note  Patient Details Name: Howard Rhodes MRN: [id number] DOB: 1930-12-19   Cancelled Treatment:    Reason Eval/Treat Not Completed: Other (comment) (awaiting orders re:Wbing status 2/2 hip fx with IM nailing)  Spoke with nursing; per chart patient currently at [facility] for rehab s/p Hip fracture with IM nailing performed 5/16.  Patient currently being seen for chest pain, no notations regarding current weight bearing status.  Will need clarification of orders and clearance for mobility. Will check back.   Fabio Asa 04/14/2013, 9:09 AM Charlotte Crumb, PT DPT  [phone]

## 2013-04-15 NOTE — Progress Notes (Addendum)
[facility] PROGRESS NOTE  Howard Rhodes ZOX:[id number] DOB: 08-Jul-1931 DOA: 04/13/2013 PCP: Ailene Ravel, MD  Assessment/Plan: 1-HCAP and small pleural effusion: continue current antibiotics; patient improving. No further fever and no cough. -WBC's trending down -IS -CT showing just small pleural effusion, no loculation and no need for thoracentesis  -most likely back to SNF in am.  2-Recent left Hip fracture: appears to be stable. Will follow ortho rec's. Per discharge instructions he was allow for partial weight bearing and plans to follow in the outpatient setting on 04/19/13. Hip x-ray is demonstrating well positioned hardware, signs of healing and calluses formation.  3-HLD: continue statins  4-Depression/anxiety: continue bupropion and celexa  DVT: heparin  Code Status: Full Family Communication: no family at bedside Disposition Plan: back to SNF at discharge; if no surprises will discharge on 04/16/13  Consultants:  Ortho (Dr. Jillyn Hidden)  Procedures:  See x-ray reports for details  Antibiotics:  Cefepime  vanc  levaquin  HPI/Subjective: Afebrile, no cough, denies CP or SOB.  Objective: Filed Vitals:   04/14/13 0507 04/14/13 1300 04/14/13 2137 04/15/13 0542  BP: 122/70 111/61 120/70 113/76  Pulse: 82 88 88 56  Temp: 99.2 F (37.3 C) 98.4 F (36.9 C) 99.2 F (37.3 C) 98.8 F (37.1 C)  TempSrc: Oral Oral Oral Oral  Resp:  20 20 20   Height:      Weight: 89.041 kg (196 lb 4.8 oz)     SpO2: 93% 94% 94% 95%    Intake/Output Summary (Last 24 hours) at 04/15/13 0935 Last data filed at 04/15/13 0854  Gross per 24 hour  Intake    360 ml  Output   1000 ml  Net   -640 ml   Filed Weights   04/13/13 0538 04/14/13 0507  Weight: 92.262 kg (203 lb 6.4 oz) 89.041 kg (196 lb 4.8 oz)    Exam:   General:  NAD, afebrile; no cough  Cardiovascular: S1 and S2, no rubs or gallops  Respiratory: scattered rhonchi, otherwise clear.  Abdomen: soft, NT, ND,  positive BS  Musculoskeletal: no swelling, no erythema, decrease range of motion on his left hip  Data Reviewed: Basic Metabolic Panel:  Recent Labs Lab 04/13/13 0303 04/13/13 0845 04/14/13 0930  NA 137 138 135  K 3.9 4.1 3.6  CL 98 99 99  CO2 30 27 24   GLUCOSE 119* 142* 141*  BUN 9 9 10   CREATININE 0.77 0.77 0.76  CALCIUM 9.6 9.1 8.9   Liver Function Tests:  Recent Labs Lab 04/13/13 0845  AST 15  ALT 10  ALKPHOS 101  BILITOT 1.4*  PROT 6.7  ALBUMIN 3.1*   CBC:  Recent Labs Lab 04/13/13 0303 04/13/13 0845 04/14/13 0930  WBC 13.9* 13.2* 12.0*  NEUTROABS  --  10.4*  --   HGB 14.2 14.4 13.9  HCT 41.6 43.6 41.9  MCV 98.1 98.9 98.1  PLT 286 250 246     Recent Results (from the past 240 hour(s))  MRSA PCR SCREENING     Status: None   Collection Time    04/13/13  6:04 AM      Result Value Range Status   MRSA by PCR NEGATIVE  NEGATIVE Final   Comment:            The GeneXpert MRSA Assay (FDA     approved for NASAL specimens     only), is one component of a     comprehensive MRSA colonization     surveillance program. It is  not     intended to diagnose MRSA     infection nor to guide or     monitor treatment for     MRSA infections.     Studies: Ct Chest Wo Contrast  04/13/2013   *RADIOLOGY REPORT*  Clinical Data: Pleural effusions  CT CHEST WITHOUT CONTRAST  Technique:  Multidetector CT imaging of the chest was performed following the standard protocol without IV contrast.  Comparison: Plain films 03/26/2013  Findings: No axillary supraclavicular lymphadenopathy.  No mediastinal or hilar lymphadenopathy.  No pericardial fluid. Esophagus is normal.  Review of the lung parenchyma demonstrates small bilateral pleural effusions.  There is mild bilateral interlobular septal thickening. There is a focus of air space disease within the lingula (image 27).  Upper lobes are clear.  No pneumothorax.  Limited view of the upper abdomen is unremarkable.  Limited view  of the skeleton is unremarkable.  IMPRESSION:  1.  Small focus of atelectasis versus infiltrate within the lingula. Recommend clinical correlation for pulmonary infection. 2.  Small bilateral pleural effusions.   Original Report Authenticated By: Genevive Bi, M.D.   Dg Hip Portable 1 View Left  04/14/2013   **ADDENDUM** CREATED: 04/14/2013 12:33:58  Single AP view of the left hip was compared to the operative images from 03/09/2013.  A long intramedullary rod supports two screws crossing the left femoral neck.  The major fracture fragments are in near anatomic position. The alignment of the fracture fragments is stable from the recent operative images.  The fracture line is less well defined which suggests some interval healing, and there is a small amount of callus formation along the inferior margin of the fracture line. The orthopedic hardware is well seated with no evidence of loosening.  The hip joint is normally aligned.  **END ADDENDUM** SIGNED BY: Amie Portland, M.D.  04/14/2013   *RADIOLOGY REPORT*  Clinical Data:  PORTABLE LEFT HIP - 1 VIEW  Comparison: None.  Findings:  IMPRESSION:   Original Report Authenticated By: Amie Portland, M.D.    Scheduled Meds: . buPROPion  100 mg Oral BID  . ceFEPime (MAXIPIME) IV  1 g Intravenous Q12H  . citalopram  10 mg Oral Daily  . ferrous fumarate  1 tablet Oral BID  . heparin subcutaneous  5,000 Units Subcutaneous Q8H  . levofloxacin (LEVAQUIN) IV  750 mg Intravenous Q24H  . saccharomyces boulardii  250 mg Oral BID  . simvastatin  40 mg Oral QPM  . sodium chloride  3 mL Intravenous Q12H  . vancomycin  1,250 mg Intravenous Q12H   Continuous Infusions:   Principal Problem:   HCAP (healthcare-associated pneumonia) Active Problems:   HLD (hyperlipidemia)   Time spent:  >30 minutes   [name]  [facility] Pager [phone]. If 7PM-7AM, please contact night-coverage at [email or web address], password [facility] 04/15/2013, 9:35 AM  LOS: 2 days

## 2013-04-16 ENCOUNTER — Encounter (HOSPITAL_COMMUNITY): Payer: Self-pay | Admitting: *Deleted

## 2013-04-16 LAB — CBC
HCT: 42.7 % (ref 39.0–52.0)
MCV: 98.4 fL (ref 78.0–100.0)
RBC: 4.34 MIL/uL (ref 4.22–5.81)
WBC: 11.4 10*3/uL — ABNORMAL HIGH (ref 4.0–10.5)

## 2013-04-16 MED ORDER — HYDROCODONE-ACETAMINOPHEN 5-325 MG PO TABS: 1.0000 | ORAL_TABLET | Freq: Four times a day (QID) | ORAL | Status: DC | PRN

## 2013-04-16 MED ORDER — LEVOFLOXACIN 750 MG PO TABS: 750.0000 mg | ORAL_TABLET | Freq: Every day | ORAL | Status: AC

## 2013-04-16 NOTE — Progress Notes (Signed)
Placed in paper gown for transfer via [facility] to [facility] for rehab, discharge instructions and med reconc included in packet for SNF, reviewed with patient but patient confused at times and seems to have memory issues, incontinent of BM and urine, report called to receiving RN at [facility], Berle Mull RN

## 2013-04-16 NOTE — Discharge Summary (Signed)
Physician Discharge Summary  Vilas Edgerly ZOX:[id number] DOB: [birth year] DOA: 04/13/2013  PCP: Ailene Ravel, MD  Admit date: 04/13/2013 Discharge date: 04/16/2013  Time spent: >30 minutes  Recommendations for Outpatient Follow-up:  1. CXR in 4 weeks to follow resolution of PNA/lung infiltrates   Discharge Diagnoses:  Principal Problem:   HCAP (healthcare-associated pneumonia) Active Problems:   HLD (hyperlipidemia) Recent Left Hip Fracture Leukocytosis Physical deconditioning  Discharge Condition: stable and improved. Will Discharge back to [facility] for physical rehab.   Filed Weights   04/13/13 0538 04/14/13 0507 04/16/13 0500  Weight: 92.262 kg (203 lb 6.4 oz) 89.041 kg (196 lb 4.8 oz) 88.6 kg (195 lb 5.2 oz)    History of present illness:  79 y.o. male with known history of hyperlipidemia who was recently transferred to rehabilitation after surgery for hip fracture started developing chest pain last 3 days. The pain is more left side increased on deep inspiration. Has mild nonproductive cough. Felt cold but no fever or chills. Patient's pain worsened yesterday and came to the ER. Chest x-ray shows left sided consolidation with parapneumonic effusion. Patient has been admitted for pneumonia. Patient still chest pain happens only when he takes a deep breath allergies chest pain-free. Denies any shortness of breath nausea vomiting diaphoresis or any abdominal pain or diarrhea. Troponin and EKG were unremarkable.   Hospital Course:  1-HCAP and small pleural effusion: No further fever and no cough.  -WBC's trending down and pretty much WNL at discharge -Patient encourage/instructed to continue using IS  -CT showing just small pleural effusion, no loculation and no need for thoracentesis at this moment. Will need repeat CXR in 4 weeks to follow/assure resolution of infection. -will discharge on Levaquin PO for 6 more days to finish antibiotic therapy  2-Recent left hip fracture:  appears to be stable. Will follow orthopedic service on 04/19/13. Hip x-ray is demonstrating well positioned hardware, signs of healing and calluses formation. Partial Weight bearing until follow up with ortho service.  3-HLD: continue statins   4-Depression/anxiety: continue bupropion and celexa  5-Physical deconditioning: per PT/OT rec's will be discharged back to SNF for rehab.   Procedures:  See below for x-ray reprots  Consultations:  Orthopedic service  Discharge Exam: Filed Vitals:   04/15/13 0542 04/15/13 1330 04/15/13 2100 04/16/13 0500  BP: 113/76 125/62 110/69 101/65  Pulse: 56 90 78 79  Temp: 98.8 F (37.1 C) 97.8 F (36.6 C) 97.9 F (36.6 C) 98.4 F (36.9 C)  TempSrc: Oral  Oral Oral  Resp: 20 17 18 18   Height:      Weight:    88.6 kg (195 lb 5.2 oz)  SpO2: 95% 93% 95% 95%   General: NAD, afebrile; no cough  Cardiovascular: S1 and S2, no rubs or gallops  Respiratory: scattered rhonchi, otherwise clear.  Abdomen: soft, NT, ND, positive BS  Musculoskeletal: no swelling, no erythema, decrease range of motion on his left hip  Discharge Instructions  Discharge Orders   Future Orders Complete By Expires     Discharge instructions  As directed     Comments:      Keep patient well hydrated  Follow up with Dr. Ranell Patrick (orthopedic service) on 04/19/13 as previously instructed Partial weight bearing until patient seen by orthopedic Doctor.    Discharge instructions  As directed         Medication List    TAKE these medications       buPROPion 100 MG 12 hr tablet  Commonly known  as:  WELLBUTRIN SR  Take 100 mg by mouth 2 (two) times daily.     citalopram 10 MG tablet  Commonly known as:  CELEXA  Take 10 mg by mouth daily.     HYDROcodone-acetaminophen 5-325 MG per tablet  Commonly known as:  NORCO/VICODIN  Take 1 tablet by mouth every 6 (six) hours as needed for pain.     levofloxacin 750 MG tablet  Commonly known as:  LEVAQUIN  Take 1 tablet (750  mg total) by mouth daily. Plan is to treat for 6 more days to finish antibiotic therapy. STOP ON 04/22/13     multivitamin with minerals Tabs  Take 1 tablet by mouth daily.     nitroGLYCERIN 0.4 MG SL tablet  Commonly known as:  NITROSTAT  Place 0.4 mg under the tongue every 5 (five) minutes as needed for chest pain.     saccharomyces boulardii 250 MG capsule  Commonly known as:  FLORASTOR  Take 250 mg by mouth 2 (two) times daily.     simvastatin 40 MG tablet  Commonly known as:  ZOCOR  Take 40 mg by mouth every evening.       Allergies  Allergen Reactions  . Lovenox (Enoxaparin Sodium) Other (See Comments)    Unknown Pt says "I'm not allergic to anything that I know of"...       Follow-up Information   Follow up with Verlee Rossetti, MD On 04/19/2013.   Contact information:   [address] [place] [address] [phone]       Follow up with [facility], MD. Schedule an appointment as soon as possible for a visit in 2 weeks.   Contact information:   Dr. Burnell Blanks [address] [place] [address] [phone]        The results of significant diagnostics from this hospitalization (including imaging, microbiology, ancillary and laboratory) are listed below for reference.    Significant Diagnostic Studies: Dg Chest 2 View  04/13/2013   *RADIOLOGY REPORT*  Clinical Data: Chest pain.  CHEST - 2 VIEW  Comparison: Chest x-ray 03/09/2013.  Findings: Lung volumes are low.  Ill-defined opacity in the left base may reflect developing airspace consolidation.  Small left pleural effusion.  No evidence of pulmonary edema.  Heart size is within normal limits.  Mediastinal contours are unremarkable. Atherosclerosis in the thoracic aorta.  IMPRESSION: 1.  Developing left lower lobe consolidation with small left-sided parapneumonic pleural effusion. 2.  Atherosclerosis.   Original Report Authenticated By: Trudie Reed, M.D.   Ct Chest Wo Contrast  04/13/2013   *RADIOLOGY REPORT*  Clinical Data: Pleural effusions  CT CHEST WITHOUT CONTRAST  Technique:  Multidetector CT imaging of the chest was performed following the standard protocol without IV contrast.  Comparison: Plain films 03/26/2013  Findings: No axillary supraclavicular lymphadenopathy.  No mediastinal or hilar lymphadenopathy.  No pericardial fluid. Esophagus is normal.  Review of the lung parenchyma demonstrates small bilateral pleural effusions.  There is mild bilateral interlobular septal thickening. There is a focus of air space disease within the lingula (image 27).  Upper lobes are clear.  No pneumothorax.  Limited view of the upper abdomen is unremarkable.  Limited view of the skeleton is unremarkable.  IMPRESSION:  1.  Small focus of atelectasis versus infiltrate within the lingula. Recommend clinical correlation for pulmonary infection. 2.  Small bilateral pleural effusions.   Original Report Authenticated By: Genevive Bi, M.D.   Dg  Hip Portable 1 View Left  04/14/2013   **ADDENDUM** CREATED: 04/14/2013 12:33:58  Single AP view of the left hip was compared to the operative images from 03/09/2013.  A long intramedullary rod supports two screws crossing the left femoral neck.  The major fracture fragments are in near anatomic position. The alignment of the fracture fragments is stable from the recent operative images.  The fracture line is less well defined which suggests some interval healing, and there is a small amount of callus formation along the inferior margin of the fracture line. The orthopedic hardware is well seated with no evidence of loosening.  The hip joint is normally aligned.  **END ADDENDUM** SIGNED BY: Amie Portland, M.D.  04/14/2013   *RADIOLOGY REPORT*  Clinical Data:  PORTABLE LEFT HIP - 1 VIEW  Comparison: None.  Findings:  IMPRESSION:   Original Report Authenticated By: Amie Portland, M.D.    Microbiology: Recent  Results (from the past 240 hour(s))  MRSA PCR SCREENING     Status: None   Collection Time    04/13/13  6:04 AM      Result Value Range Status   MRSA by PCR NEGATIVE  NEGATIVE Final   Comment:            The GeneXpert MRSA Assay (FDA     approved for NASAL specimens     only), is one component of a     comprehensive MRSA colonization     surveillance program. It is not     intended to diagnose MRSA     infection nor to guide or     monitor treatment for     MRSA infections.     Labs: Basic Metabolic Panel:  Recent Labs Lab 04/13/13 0303 04/13/13 0845 04/14/13 0930  NA 137 138 135  K 3.9 4.1 3.6  CL 98 99 99  CO2 30 27 24   GLUCOSE 119* 142* 141*  BUN 9 9 10   CREATININE 0.77 0.77 0.76  CALCIUM 9.6 9.1 8.9   Liver Function Tests:  Recent Labs Lab 04/13/13 0845  AST 15  ALT 10  ALKPHOS 101  BILITOT 1.4*  PROT 6.7  ALBUMIN 3.1*   CBC:  Recent Labs Lab 04/13/13 0303 04/13/13 0845 04/14/13 0930 04/16/13 0350  WBC 13.9* 13.2* 12.0* 11.4*  NEUTROABS  --  10.4*  --   --   HGB 14.2 14.4 13.9 14.2  HCT 41.6 43.6 41.9 42.7  MCV 98.1 98.9 98.1 98.4  PLT 286 250 246 300    Signed:  Saria Haran  [facility] 04/16/2013, 9:00 AM

## 2013-04-16 NOTE — Progress Notes (Signed)
Clinical Child psychotherapist (CSW) prepared and placed pt dc packet in pt shadow chart. Pt remains aware and agreeable of discharge. CSW contacted [facility] for a 14:00 pick up and transport to [facility] no additional needs, CSW signing off.  Theresia Bough, MSW, Theresia Majors [phone]

## 2013-04-16 NOTE — Progress Notes (Signed)
Clinical Child psychotherapist (CSW) informed pt ready for dc today. CSW confirmed with Revonda Standard at [facility] that pt able to return today. CSW to assist with dc today.  Theresia Bough, MSW, Theresia Majors [phone]

## 2014-11-07 DIAGNOSIS — L97529 Non-pressure chronic ulcer of other part of left foot with unspecified severity: Secondary | ICD-10-CM | POA: Diagnosis not present

## 2014-11-07 DIAGNOSIS — E782 Mixed hyperlipidemia: Secondary | ICD-10-CM | POA: Diagnosis not present

## 2014-11-11 DIAGNOSIS — I6789 Other cerebrovascular disease: Secondary | ICD-10-CM | POA: Diagnosis not present

## 2014-11-11 DIAGNOSIS — Z5189 Encounter for other specified aftercare: Secondary | ICD-10-CM | POA: Diagnosis not present

## 2014-12-12 DIAGNOSIS — Z5189 Encounter for other specified aftercare: Secondary | ICD-10-CM | POA: Diagnosis not present

## 2014-12-12 DIAGNOSIS — I6789 Other cerebrovascular disease: Secondary | ICD-10-CM | POA: Diagnosis not present

## 2015-01-10 DIAGNOSIS — I6789 Other cerebrovascular disease: Secondary | ICD-10-CM | POA: Diagnosis not present

## 2015-01-10 DIAGNOSIS — Z5189 Encounter for other specified aftercare: Secondary | ICD-10-CM | POA: Diagnosis not present

## 2015-02-10 DIAGNOSIS — Z5189 Encounter for other specified aftercare: Secondary | ICD-10-CM | POA: Diagnosis not present

## 2015-02-10 DIAGNOSIS — I6789 Other cerebrovascular disease: Secondary | ICD-10-CM | POA: Diagnosis not present

## 2015-02-12 DIAGNOSIS — Z7401 Bed confinement status: Secondary | ICD-10-CM | POA: Diagnosis not present

## 2015-02-12 DIAGNOSIS — R159 Full incontinence of feces: Secondary | ICD-10-CM | POA: Diagnosis not present

## 2015-02-12 DIAGNOSIS — M21372 Foot drop, left foot: Secondary | ICD-10-CM | POA: Diagnosis not present

## 2015-02-12 DIAGNOSIS — L89890 Pressure ulcer of other site, unstageable: Secondary | ICD-10-CM | POA: Diagnosis not present

## 2015-02-12 DIAGNOSIS — M1990 Unspecified osteoarthritis, unspecified site: Secondary | ICD-10-CM | POA: Diagnosis not present

## 2015-02-12 DIAGNOSIS — R7309 Other abnormal glucose: Secondary | ICD-10-CM | POA: Diagnosis not present

## 2015-02-12 DIAGNOSIS — R32 Unspecified urinary incontinence: Secondary | ICD-10-CM | POA: Diagnosis not present

## 2015-02-13 DIAGNOSIS — L89890 Pressure ulcer of other site, unstageable: Secondary | ICD-10-CM | POA: Diagnosis not present

## 2015-02-17 DIAGNOSIS — R7309 Other abnormal glucose: Secondary | ICD-10-CM | POA: Diagnosis not present

## 2015-02-17 DIAGNOSIS — R32 Unspecified urinary incontinence: Secondary | ICD-10-CM | POA: Diagnosis not present

## 2015-02-17 DIAGNOSIS — M1990 Unspecified osteoarthritis, unspecified site: Secondary | ICD-10-CM | POA: Diagnosis not present

## 2015-02-17 DIAGNOSIS — Z7401 Bed confinement status: Secondary | ICD-10-CM | POA: Diagnosis not present

## 2015-02-17 DIAGNOSIS — R159 Full incontinence of feces: Secondary | ICD-10-CM | POA: Diagnosis not present

## 2015-02-17 DIAGNOSIS — L89890 Pressure ulcer of other site, unstageable: Secondary | ICD-10-CM | POA: Diagnosis not present

## 2015-02-17 DIAGNOSIS — M21372 Foot drop, left foot: Secondary | ICD-10-CM | POA: Diagnosis not present

## 2015-02-24 DIAGNOSIS — R159 Full incontinence of feces: Secondary | ICD-10-CM | POA: Diagnosis not present

## 2015-02-24 DIAGNOSIS — M1990 Unspecified osteoarthritis, unspecified site: Secondary | ICD-10-CM | POA: Diagnosis not present

## 2015-02-24 DIAGNOSIS — R7309 Other abnormal glucose: Secondary | ICD-10-CM | POA: Diagnosis not present

## 2015-02-24 DIAGNOSIS — L89890 Pressure ulcer of other site, unstageable: Secondary | ICD-10-CM | POA: Diagnosis not present

## 2015-02-24 DIAGNOSIS — M21372 Foot drop, left foot: Secondary | ICD-10-CM | POA: Diagnosis not present

## 2015-02-24 DIAGNOSIS — R32 Unspecified urinary incontinence: Secondary | ICD-10-CM | POA: Diagnosis not present

## 2015-02-24 DIAGNOSIS — Z7401 Bed confinement status: Secondary | ICD-10-CM | POA: Diagnosis not present

## 2015-03-03 DIAGNOSIS — M21372 Foot drop, left foot: Secondary | ICD-10-CM | POA: Diagnosis not present

## 2015-03-03 DIAGNOSIS — R32 Unspecified urinary incontinence: Secondary | ICD-10-CM | POA: Diagnosis not present

## 2015-03-03 DIAGNOSIS — R159 Full incontinence of feces: Secondary | ICD-10-CM | POA: Diagnosis not present

## 2015-03-03 DIAGNOSIS — Z7401 Bed confinement status: Secondary | ICD-10-CM | POA: Diagnosis not present

## 2015-03-03 DIAGNOSIS — M1990 Unspecified osteoarthritis, unspecified site: Secondary | ICD-10-CM | POA: Diagnosis not present

## 2015-03-03 DIAGNOSIS — L89890 Pressure ulcer of other site, unstageable: Secondary | ICD-10-CM | POA: Diagnosis not present

## 2015-03-03 DIAGNOSIS — R7309 Other abnormal glucose: Secondary | ICD-10-CM | POA: Diagnosis not present

## 2015-03-05 DIAGNOSIS — M21372 Foot drop, left foot: Secondary | ICD-10-CM | POA: Diagnosis not present

## 2015-03-05 DIAGNOSIS — M1990 Unspecified osteoarthritis, unspecified site: Secondary | ICD-10-CM | POA: Diagnosis not present

## 2015-03-05 DIAGNOSIS — R159 Full incontinence of feces: Secondary | ICD-10-CM | POA: Diagnosis not present

## 2015-03-05 DIAGNOSIS — R32 Unspecified urinary incontinence: Secondary | ICD-10-CM | POA: Diagnosis not present

## 2015-03-05 DIAGNOSIS — R7309 Other abnormal glucose: Secondary | ICD-10-CM | POA: Diagnosis not present

## 2015-03-05 DIAGNOSIS — Z7401 Bed confinement status: Secondary | ICD-10-CM | POA: Diagnosis not present

## 2015-03-05 DIAGNOSIS — L89890 Pressure ulcer of other site, unstageable: Secondary | ICD-10-CM | POA: Diagnosis not present

## 2015-03-12 DIAGNOSIS — I6789 Other cerebrovascular disease: Secondary | ICD-10-CM | POA: Diagnosis not present

## 2015-03-12 DIAGNOSIS — Z5189 Encounter for other specified aftercare: Secondary | ICD-10-CM | POA: Diagnosis not present

## 2015-04-09 ENCOUNTER — Emergency Department (HOSPITAL_COMMUNITY): Payer: Medicare Other

## 2015-04-09 ENCOUNTER — Inpatient Hospital Stay (HOSPITAL_COMMUNITY)
Admission: EM | Admit: 2015-04-09 | Discharge: 2015-04-18 | DRG: 871 | Disposition: A | Discharge status: Home / Self Care | Payer: Medicare Other | Attending: Internal Medicine | Admitting: Internal Medicine

## 2015-04-09 ENCOUNTER — Encounter (HOSPITAL_COMMUNITY): Payer: Self-pay

## 2015-04-09 DIAGNOSIS — E872 Acidosis, unspecified: Secondary | ICD-10-CM | POA: Diagnosis present

## 2015-04-09 DIAGNOSIS — F329 Major depressive disorder, single episode, unspecified: Secondary | ICD-10-CM | POA: Diagnosis not present

## 2015-04-09 DIAGNOSIS — D649 Anemia, unspecified: Secondary | ICD-10-CM | POA: Diagnosis not present

## 2015-04-09 DIAGNOSIS — K760 Fatty (change of) liver, not elsewhere classified: Secondary | ICD-10-CM | POA: Diagnosis present

## 2015-04-09 DIAGNOSIS — R40242 Glasgow coma scale score 9-12: Secondary | ICD-10-CM | POA: Diagnosis not present

## 2015-04-09 DIAGNOSIS — A4150 Gram-negative sepsis, unspecified: Secondary | ICD-10-CM | POA: Diagnosis not present

## 2015-04-09 DIAGNOSIS — G9340 Encephalopathy, unspecified: Secondary | ICD-10-CM | POA: Diagnosis not present

## 2015-04-09 DIAGNOSIS — R7881 Bacteremia: Secondary | ICD-10-CM | POA: Diagnosis not present

## 2015-04-09 DIAGNOSIS — R6521 Severe sepsis with septic shock: Secondary | ICD-10-CM | POA: Diagnosis not present

## 2015-04-09 DIAGNOSIS — E440 Moderate protein-calorie malnutrition: Secondary | ICD-10-CM | POA: Diagnosis present

## 2015-04-09 DIAGNOSIS — Z8673 Personal history of transient ischemic attack (TIA), and cerebral infarction without residual deficits: Secondary | ICD-10-CM

## 2015-04-09 DIAGNOSIS — Z452 Encounter for adjustment and management of vascular access device: Secondary | ICD-10-CM

## 2015-04-09 DIAGNOSIS — E785 Hyperlipidemia, unspecified: Secondary | ICD-10-CM | POA: Diagnosis present

## 2015-04-09 DIAGNOSIS — J189 Pneumonia, unspecified organism: Secondary | ICD-10-CM | POA: Diagnosis not present

## 2015-04-09 DIAGNOSIS — J90 Pleural effusion, not elsewhere classified: Secondary | ICD-10-CM | POA: Diagnosis not present

## 2015-04-09 DIAGNOSIS — Z09 Encounter for follow-up examination after completed treatment for conditions other than malignant neoplasm: Secondary | ICD-10-CM

## 2015-04-09 DIAGNOSIS — R509 Fever, unspecified: Secondary | ICD-10-CM

## 2015-04-09 DIAGNOSIS — R410 Disorientation, unspecified: Secondary | ICD-10-CM

## 2015-04-09 DIAGNOSIS — I517 Cardiomegaly: Secondary | ICD-10-CM | POA: Diagnosis not present

## 2015-04-09 DIAGNOSIS — Z6835 Body mass index (BMI) 35.0-35.9, adult: Secondary | ICD-10-CM | POA: Diagnosis not present

## 2015-04-09 DIAGNOSIS — R740 Nonspecific elevation of levels of transaminase and lactic acid dehydrogenase [LDH]: Secondary | ICD-10-CM | POA: Diagnosis not present

## 2015-04-09 DIAGNOSIS — Z87891 Personal history of nicotine dependence: Secondary | ICD-10-CM

## 2015-04-09 DIAGNOSIS — E8770 Fluid overload, unspecified: Secondary | ICD-10-CM | POA: Diagnosis not present

## 2015-04-09 DIAGNOSIS — A419 Sepsis, unspecified organism: Secondary | ICD-10-CM

## 2015-04-09 DIAGNOSIS — J9600 Acute respiratory failure, unspecified whether with hypoxia or hypercapnia: Secondary | ICD-10-CM | POA: Diagnosis not present

## 2015-04-09 DIAGNOSIS — R7989 Other specified abnormal findings of blood chemistry: Secondary | ICD-10-CM | POA: Diagnosis not present

## 2015-04-09 DIAGNOSIS — R059 Cough, unspecified: Secondary | ICD-10-CM

## 2015-04-09 DIAGNOSIS — E876 Hypokalemia: Secondary | ICD-10-CM | POA: Diagnosis not present

## 2015-04-09 DIAGNOSIS — B961 Klebsiella pneumoniae [K. pneumoniae] as the cause of diseases classified elsewhere: Secondary | ICD-10-CM | POA: Diagnosis not present

## 2015-04-09 DIAGNOSIS — Z7401 Bed confinement status: Secondary | ICD-10-CM

## 2015-04-09 DIAGNOSIS — Z888 Allergy status to other drugs, medicaments and biological substances status: Secondary | ICD-10-CM | POA: Diagnosis not present

## 2015-04-09 DIAGNOSIS — R7401 Elevation of levels of liver transaminase levels: Secondary | ICD-10-CM | POA: Clinically undetermined

## 2015-04-09 DIAGNOSIS — M1990 Unspecified osteoarthritis, unspecified site: Secondary | ICD-10-CM | POA: Diagnosis present

## 2015-04-09 DIAGNOSIS — R0902 Hypoxemia: Secondary | ICD-10-CM | POA: Diagnosis not present

## 2015-04-09 DIAGNOSIS — E669 Obesity, unspecified: Secondary | ICD-10-CM | POA: Diagnosis not present

## 2015-04-09 DIAGNOSIS — R0602 Shortness of breath: Secondary | ICD-10-CM | POA: Diagnosis not present

## 2015-04-09 DIAGNOSIS — R05 Cough: Secondary | ICD-10-CM

## 2015-04-09 DIAGNOSIS — R4182 Altered mental status, unspecified: Secondary | ICD-10-CM | POA: Diagnosis not present

## 2015-04-09 DIAGNOSIS — K828 Other specified diseases of gallbladder: Secondary | ICD-10-CM | POA: Diagnosis not present

## 2015-04-09 DIAGNOSIS — R945 Abnormal results of liver function studies: Secondary | ICD-10-CM

## 2015-04-09 HISTORY — DX: Cerebral infarction, unspecified: I63.9

## 2015-04-09 LAB — CBC WITH DIFFERENTIAL/PLATELET
BASOS PCT: 0 % (ref 0–1)
Basophils Absolute: 0 10*3/uL (ref 0.0–0.1)
Eosinophils Absolute: 0 10*3/uL (ref 0.0–0.7)
Eosinophils Relative: 0 % (ref 0–5)
HEMATOCRIT: 54.7 % — AB (ref 39.0–52.0)
Hemoglobin: 19.2 g/dL — ABNORMAL HIGH (ref 13.0–17.0)
Lymphocytes Relative: 2 % — ABNORMAL LOW (ref 12–46)
Lymphs Abs: 0.5 10*3/uL — ABNORMAL LOW (ref 0.7–4.0)
MCH: 33.4 pg (ref 26.0–34.0)
MCHC: 35.1 g/dL (ref 30.0–36.0)
MCV: 95.3 fL (ref 78.0–100.0)
Monocytes Absolute: 0.6 10*3/uL (ref 0.1–1.0)
Monocytes Relative: 3 % (ref 3–12)
NEUTROS ABS: 20.8 10*3/uL — AB (ref 1.7–7.7)
Neutrophils Relative %: 95 % — ABNORMAL HIGH (ref 43–77)
PLATELETS: 229 10*3/uL (ref 150–400)
RBC: 5.74 MIL/uL (ref 4.22–5.81)
RDW: 14.5 % (ref 11.5–15.5)
WBC: 21.9 10*3/uL — AB (ref 4.0–10.5)

## 2015-04-09 LAB — I-STAT CG4 LACTIC ACID, ED: Lactic Acid, Venous: 5.97 mmol/L (ref 0.5–2.0)

## 2015-04-09 LAB — I-STAT VENOUS BLOOD GAS, ED
Acid-base deficit: 3 mmol/L — ABNORMAL HIGH (ref 0.0–2.0)
Bicarbonate: 20.8 mEq/L (ref 20.0–24.0)
O2 Saturation: 83 %
PCO2 VEN: 32.7 mmHg — AB (ref 45.0–50.0)
PH VEN: 7.411 — AB (ref 7.250–7.300)
TCO2: 22 mmol/L (ref 0–100)
pO2, Ven: 46 mmHg — ABNORMAL HIGH (ref 30.0–45.0)

## 2015-04-09 LAB — URINALYSIS, ROUTINE W REFLEX MICROSCOPIC
Glucose, UA: NEGATIVE mg/dL
HGB URINE DIPSTICK: NEGATIVE
KETONES UR: 15 mg/dL — AB
Nitrite: NEGATIVE
PROTEIN: NEGATIVE mg/dL
Specific Gravity, Urine: 1.02 (ref 1.005–1.030)
Urobilinogen, UA: 1 mg/dL (ref 0.0–1.0)
pH: 5 (ref 5.0–8.0)

## 2015-04-09 LAB — URINE MICROSCOPIC-ADD ON

## 2015-04-09 LAB — PROTIME-INR
INR: 1.35 (ref 0.00–1.49)
Prothrombin Time: 16.8 seconds — ABNORMAL HIGH (ref 11.6–15.2)

## 2015-04-09 LAB — BRAIN NATRIURETIC PEPTIDE: B Natriuretic Peptide: 118.4 pg/mL — ABNORMAL HIGH (ref 0.0–100.0)

## 2015-04-09 LAB — APTT: aPTT: 29 seconds (ref 24–37)

## 2015-04-09 MED ORDER — SODIUM CHLORIDE 0.9 % IV SOLN
INTRAVENOUS | Status: DC
  Administered 2015-04-10 (×4): via INTRAVENOUS

## 2015-04-09 MED ORDER — HEPARIN SODIUM (PORCINE) 5000 UNIT/ML IJ SOLN
5000.0000 [IU] | Freq: Three times a day (TID) | INTRAMUSCULAR | Status: DC
  Administered 2015-04-10 – 2015-04-11 (×4): 5000 [IU] via SUBCUTANEOUS
  Filled 2015-04-09 (×8): qty 1

## 2015-04-09 MED ORDER — SODIUM CHLORIDE 0.9 % IV BOLUS (SEPSIS)
1000.0000 mL | INTRAVENOUS | Status: AC
  Administered 2015-04-09 (×4): 1000 mL via INTRAVENOUS

## 2015-04-09 MED ORDER — VANCOMYCIN HCL 10 G IV SOLR
1500.0000 mg | Freq: Once | INTRAVENOUS | Status: AC
  Administered 2015-04-09: 1500 mg via INTRAVENOUS
  Filled 2015-04-09: qty 1500

## 2015-04-09 MED ORDER — VANCOMYCIN HCL IN DEXTROSE 1-5 GM/200ML-% IV SOLN: 1000.0000 mg | Freq: Once | INTRAVENOUS | Status: DC

## 2015-04-09 MED ORDER — SODIUM CHLORIDE 0.9 % IV SOLN: 250.0000 mL | INTRAVENOUS | Status: DC | PRN

## 2015-04-09 MED ORDER — PIPERACILLIN-TAZOBACTAM 3.375 G IVPB 30 MIN
3.3750 g | Freq: Once | INTRAVENOUS | Status: AC
  Administered 2015-04-09: 3.375 g via INTRAVENOUS
  Filled 2015-04-09: qty 50

## 2015-04-09 MED ORDER — ACETAMINOPHEN 650 MG RE SUPP
650.0000 mg | Freq: Once | RECTAL | Status: AC
  Administered 2015-04-09: 650 mg via RECTAL
  Filled 2015-04-09: qty 1

## 2015-04-09 MED ORDER — SODIUM CHLORIDE 0.9 % IV BOLUS (SEPSIS)
500.0000 mL | INTRAVENOUS | Status: AC
  Administered 2015-04-09 (×3): 500 mL via INTRAVENOUS

## 2015-04-09 NOTE — Progress Notes (Addendum)
ANTIBIOTIC CONSULT NOTE - INITIAL  Pharmacy Consult for vancomycin, zosyn ->vancomycin, rocephin Indication: rule out sepsis  Allergies  Allergen Reactions  . Lovenox [Enoxaparin Sodium] Other (See Comments)    Unknown Pt says "I'm not allergic to anything that I know of"...    Patient Measurements:   Adjusted Body Weight:   Vital Signs:   Intake/Output from previous day:   Intake/Output from this shift:    Labs: No results for input(s): WBC, HGB, PLT, LABCREA, CREATININE in the last 72 hours. CrCl cannot be calculated (Unknown ideal weight.). No results for input(s): VANCOTROUGH, VANCOPEAK, VANCORANDOM, GENTTROUGH, GENTPEAK, GENTRANDOM, TOBRATROUGH, TOBRAPEAK, TOBRARND, AMIKACINPEAK, AMIKACINTROU, AMIKACIN in the last 72 hours.   Microbiology: No results found for this or any previous visit (from the past 720 hour(s)).  Medical History: Past Medical History  Diagnosis Date  . HLD (hyperlipidemia)   . Arthritis   . Anemia   . Depression   . Hip fracture   . Stroke     Medications:  See EMR  Assessment: Initiating abx for code sepsis, no current labs, LA 5.9.  Goal of Therapy:  Vancomycin trough level 15-20 mcg/ml  Plan:  Vancomycin 1500 mg IV x1 Zosyn 3.375 g IV x1 Monitor renal fx, cultures, VT as needed F/u SCr for maintenance dosing     Agapito Games, PharmD, BCPS Clinical Pharmacist Pager: [phone] 04/09/2015 8:40 PM    Addendum 4709: Abx changed to Rocephin and Vancomycin on admit by CCM SCr 0.87, est CrCl 65 ml/min.  Plan: Vancomycin 1gm IV q12h Rocephin 1gm IV q24h Will f/u renal function, micro data, and pt's clinical condition  Christoper Fabian, PharmD, BCPS Clinical pharmacist, pager [phone] 04/10/2015 6:42 AM

## 2015-04-09 NOTE — ED Provider Notes (Signed)
CSN: [id number]     Arrival date & time 04/09/15  2012 History   First MD Initiated Contact with Patient 04/09/15 2021     Chief Complaint  Patient presents with  . Code Sepsis     (Consider location/radiation/quality/duration/timing/severity/associated sxs/prior Treatment) HPI Comments: Patient is an 81 yo M PMHx significant for TIA, HLD, Anemia presenting to the ED from home via EMS for AMS. Per the mother and daughter in law the patient awoke this morning and was well. The wife states he ate his breakfast normally, then around 12:30 or 1PM he seems to get fatigued tired, loss of interest in eating his lunch. She states he felt "chilled" and slowly through the afternoon became less and less talkative. Around 6PM he stopped answering questions. The wife denies that the patient has had any vomiting, diarrhea, cough, congestion, or other signs of illnesses. No sick contacts. Patient is a level V caveat d/t AMS.  The history is provided by the spouse and a relative. The history is limited by the condition of the patient.    Past Medical History  Diagnosis Date  . HLD (hyperlipidemia)   . Arthritis   . Anemia   . Depression   . Hip fracture   . Stroke    Past Surgical History  Procedure Laterality Date  . Hernia repair    . Femur im nail Left 03/09/2013    Procedure: INTRAMEDULLARY (IM) NAIL FEMORAL;  Surgeon: Verlee Rossetti, MD;  Location: [facility];  Service: Orthopedics;  Laterality: Left;   Family History  Problem Relation Age of Onset  . CAD Brother    History  Substance Use Topics  . Smoking status: Former Smoker -- 10 years    Types: Cigarettes, Cigars  . Smokeless tobacco: Not on file  . Alcohol Use: No    Review of Systems  Unable to perform ROS: Acuity of condition      Allergies  Lovenox  Home Medications   Prior to Admission medications   Medication Sig Start Date End Date Taking? Authorizing Provider  atorvastatin (LIPITOR) 40 MG tablet Take 40 mg by mouth  daily at 6 PM.   Yes Historical Provider, MD  HYDROcodone-acetaminophen (NORCO/VICODIN) 5-325 MG per tablet Take 1 tablet by mouth every 6 (six) hours as needed for pain. 04/16/13   Vassie Loll, MD   BP 94/60 mmHg  Pulse 120  Temp(Src) 100.3 F (37.9 C) (Rectal)  Resp 28  SpO2 92% Physical Exam  Constitutional: He appears well-developed and well-nourished.  HENT:  Head: Normocephalic and atraumatic.  Right Ear: External ear normal.  Left Ear: External ear normal.  Eyes: Conjunctivae are normal. Pupils are equal, round, and reactive to light.  Neck: Neck supple.  Cardiovascular: Regular rhythm, normal heart sounds and normal pulses.  Tachycardia present.   Cap refill < 3 sec  Pulmonary/Chest: Effort normal and breath sounds normal.  Abdominal: Soft. There is no tenderness.  Neurological: He is alert. He is disoriented. GCS eye subscore is 4. GCS verbal subscore is 1. GCS motor subscore is 5.  Skin: Skin is warm and dry.    ED Course  Procedures (including critical care time) Medications  0.9 %  sodium chloride infusion (not administered)  heparin injection 5,000 Units (not administered)  0.9 %  sodium chloride infusion ( Intravenous New Bag/Given 04/10/15 0016)  cefTRIAXone (ROCEPHIN) 1 g in dextrose 5 % 50 mL IVPB (1 g Intravenous New Bag/Given 04/10/15 0033)  sodium chloride 0.9 % bolus 1,000  mL (1,000 mLs Intravenous New Bag/Given 04/09/15 2356)    And  sodium chloride 0.9 % bolus 500 mL (0 mLs Intravenous Stopped 04/09/15 2337)  piperacillin-tazobactam (ZOSYN) IVPB 3.375 g (0 g Intravenous Stopped 04/09/15 2330)  vancomycin (VANCOCIN) 1,500 mg in sodium chloride 0.9 % 500 mL IVPB (0 mg Intravenous Stopped 04/10/15 0037)  acetaminophen (TYLENOL) suppository 650 mg (650 mg Rectal Given 04/09/15 2149)    Labs Review Labs Reviewed  CBC WITH DIFFERENTIAL/PLATELET - Abnormal; Notable for the following:    WBC 21.9 (*)    Hemoglobin 19.2 (*)    HCT 54.7 (*)    Neutrophils Relative  % 95 (*)    Neutro Abs 20.8 (*)    Lymphocytes Relative 2 (*)    Lymphs Abs 0.5 (*)    All other components within normal limits  URINALYSIS, ROUTINE W REFLEX MICROSCOPIC (NOT AT [facility]) - Abnormal; Notable for the following:    Color, Urine ORANGE (*)    APPearance CLOUDY (*)    Bilirubin Urine SMALL (*)    Ketones, ur 15 (*)    Leukocytes, UA SMALL (*)    All other components within normal limits  PROTIME-INR - Abnormal; Notable for the following:    Prothrombin Time 16.8 (*)    All other components within normal limits  BRAIN NATRIURETIC PEPTIDE - Abnormal; Notable for the following:    B Natriuretic Peptide 118.4 (*)    All other components within normal limits  URINE MICROSCOPIC-ADD ON - Abnormal; Notable for the following:    Casts HYALINE CASTS (*)    All other components within normal limits  I-STAT CG4 LACTIC ACID, ED - Abnormal; Notable for the following:    Lactic Acid, Venous 5.97 (*)    All other components within normal limits  I-STAT VENOUS BLOOD GAS, ED - Abnormal; Notable for the following:    pH, Ven 7.411 (*)    pCO2, Ven 32.7 (*)    pO2, Ven 46.0 (*)    Acid-base deficit 3.0 (*)    All other components within normal limits  CULTURE, BLOOD (ROUTINE X 2)  CULTURE, BLOOD (ROUTINE X 2)  URINE CULTURE  RESPIRATORY VIRUS PANEL  CULTURE, EXPECTORATED SPUTUM-ASSESSMENT  APTT  COMPREHENSIVE METABOLIC PANEL  TROPONIN I  TROPONIN I  CBC  BASIC METABOLIC PANEL  MAGNESIUM  PHOSPHORUS  PROCALCITONIN  HEMOGLOBIN A1C  TSH  VITAMIN B12  FOLATE  AMMONIA  I-STAT CG4 LACTIC ACID, ED    Imaging Review Dg Chest Port 1 View  04/09/2015   CLINICAL DATA:  81 year old male with sepsis  EXAM: PORTABLE CHEST - 1 VIEW  COMPARISON:  Prior chest x-ray 04/13/2014  FINDINGS: Low inspiratory volumes. Nonspecific left basilar opacity favored to reflect atelectasis and chronic scarring in the lingula. Similar appearance on prior imaging. No focal airspace consolidation, pulmonary  edema or pleural effusion. No pneumothorax. No large free air. No acute osseous abnormality.  IMPRESSION: Low inspiratory volumes with chronic left basilar atelectasis and/ or scarring.  No acute cardiopulmonary process.   Electronically Signed   By: Malachy Moan M.D.   On: 04/09/2015 21:25     EKG Interpretation   Date/Time:  Wednesday April 09 2015 20:33:56 EDT Ventricular Rate:  125 PR Interval:  71 QRS Duration: 71 QT Interval:  278 QTC Calculation: 401 R Axis:   -19 Text Interpretation:  Sinus tachycardia Borderline left axis deviation  Anteroseptal infarct, age indeterminate Baseline wander in lead(s) V4  Confirmed by DOCHERTY  MD, MEGAN [phone])  on 04/09/2015 8:39:38 PM      CRITICAL CARE Performed by: Francee Piccolo L   Total critical care time: 45 minutes  Critical care time was exclusive of separately billable procedures and treating other patients.  Critical care was necessary to treat or prevent imminent or life-threatening deterioration.  Critical care was time spent personally by me on the following activities: development of treatment plan with patient and/or surrogate as well as nursing, discussions with consultants, evaluation of patient's response to treatment, examination of patient, obtaining history from patient or surrogate, ordering and performing treatments and interventions, ordering and review of laboratory studies, ordering and review of radiographic studies, pulse oximetry and re-evaluation of patient's condition.  11:08 PM On re-evaluation patient is following commands. Able to move neck without rigidity or pain. Patient able to nod and shake head in response to questions. Blood pressure is 86/54 after 3L of fluid   11:17 PM Discussed patient with Dr. Tyson Alias of critical care who will be down for evaluation of patient.   MDM   Final diagnoses:  Septic shock    Filed Vitals:   04/10/15 0145  BP: 94/60  Pulse:   Temp:   Resp: 28    Patient presenting from home for AMS. Presenting with fever, tachycardia, tachypnea. Patient is alert but not responding to commands. Aside from disorientation, tachycardia, tachypnea no other acute physical exam findings to suggest source of infection. Rectal Tylenol given with improvement of fever, tachycardia, and tachypnea although not resolution. Patient mental status improved as well, able to follow commands. Labs reviewed. CXR unremarkable. UA unremarkable. No nuchal rigidity to suggest menigitis. Critical care consulted and will admit patient for septic shock of unknown source. Patient d/w with Dr. Micheline Maze, agrees with plan.      Francee Piccolo, PA-C 04/10/15 5366  Toy Cookey, MD 04/11/15 1705

## 2015-04-09 NOTE — H&P (Signed)
PULMONARY / CRITICAL CARE MEDICINE   Name: Howard Rhodes MRN: [id number] DOB: 05-Mar-1931    ADMISSION DATE:  04/09/2015 CONSULTATION DATE:  04/09/2015  REFERRING MD :  EDP  CHIEF COMPLAINT:  AMS  INITIAL PRESENTATION:  81 y.o. M brought [facility] ED 6/15 with AMS and hypotension.  In ED, remained hypotensive after 4L IVF.  PCCM called for admission of septic shock.     STUDIES:  CXR 6/15 >>> low volumes, no acute process.  SIGNIFICANT EVENTS: 6/15 - admit.   HISTORY OF PRESENT ILLNESS:  Pt is encephalopathic; therefore, this HPI is obtained from chart review. Howard Rhodes is a 81 y.o. M with PMH as below.  He was brought to [facility] ED 6/15 for AMS.  Per his wife and step-son, pt was in his USOH one day prior and on day of presentation, he just became confused and less verbal starting that morning.  Symptoms progressed through the day and pt did not feel like eating his lunch and felt more fatigued before he stopped answering all questions completely.  This prompted family to bring him to ED for further evaluation.  In ED, pt remained altered.  He was hypotensive with SBP in high 80's initially and temp of 103.  Initial lactate 5.9 with WBC 22.  He was started on IVF resuscitation and empiric abx.  PCCM was called for admission.  On my exam, pt and family deny any recent fevers/chills/sweats, headache, chest pain, SOB, N/V/D, abd pain, myalgias.  He has not had any healthcare exposure for over a year.  He is bedbound after he never fully recovered from a hip fracture.   PAST MEDICAL HISTORY :   has a past medical history of HLD (hyperlipidemia); Arthritis; Anemia; Depression; Hip fracture; and Stroke.  has past surgical history that includes Hernia repair and Femur IM nail (Left, 03/09/2013). Prior to Admission medications   Medication Sig Start Date End Date Taking? Authorizing Provider  atorvastatin (LIPITOR) 40 MG tablet Take 40 mg by mouth daily at 6 PM.   Yes Historical Provider, MD   HYDROcodone-acetaminophen (NORCO/VICODIN) 5-325 MG per tablet Take 1 tablet by mouth every 6 (six) hours as needed for pain. 04/16/13   Vassie Loll, MD   Allergies  Allergen Reactions  . Lovenox [Enoxaparin Sodium] Other (See Comments)    Pt says "I'm not allergic to anything that I know of"...    FAMILY HISTORY:  Family History  Problem Relation Age of Onset  . CAD Brother     SOCIAL HISTORY:  reports that he has quit smoking. His smoking use included Cigarettes and Cigars. He quit after 10 years of use. He does not have any smokeless tobacco history on file. He reports that he does not drink alcohol or use illicit drugs.  REVIEW OF SYSTEMS:  All negative; except for those that are bolded, which indicate positives.  Constitutional: weight loss, weight gain, night sweats, fevers, chills, fatigue, weakness, confusion. HEENT: headaches, sore throat, sneezing, nasal congestion, post nasal drip, difficulty swallowing, tooth/dental problems, visual complaints, visual changes, ear aches. Neuro: difficulty with speech, weakness, numbness, ataxia. CV:  chest pain, orthopnea, PND, swelling in lower extremities, dizziness, palpitations, syncope.  Resp: cough, hemoptysis, dyspnea, wheezing. GI  heartburn, indigestion, abdominal pain, nausea, vomiting, diarrhea, constipation, change in bowel habits, loss of appetite, hematemesis, melena, hematochezia.  GU: dysuria, change in color of urine, urgency or frequency, flank pain, hematuria. MSK: joint pain or swelling, decreased range of motion. Psych: change in mood or  affect, depression, anxiety, suicidal ideations, homicidal ideations. Skin: rash, itching, bruising.   SUBJECTIVE: Starting to communicate again and answer questions.  VITAL SIGNS: Temp:  [100.3 F (37.9 C)-103.5 F (39.7 C)] 100.3 F (37.9 C) (06/15 2303) Pulse Rate:  [120] 120 (06/15 2115) Resp:  [23-33] 26 (06/15 2330) BP: (84-104)/(53-61) 97/55 mmHg (06/15 2330) SpO2:   [92 %-94 %] 92 % (06/15 2115) HEMODYNAMICS:   VENTILATOR SETTINGS:   INTAKE / OUTPUT: Intake/Output    None     PHYSICAL EXAMINATION: General: Elderly male, in NAD. Neuro: Awake but confused.  A&O to person and time but not place. HEENT: [place]/AT. PERRL, sclerae anicteric. MM dry. Cardiovascular: Tachy, regular, no M/R/G.  Lungs: Respirations shallow and unlabored.  CTA bilaterally, No W/R/R. Abdomen: BS x 4, soft, NT/ND.  Musculoskeletal: No gross deformities, no edema.  Skin: Intact, warm, no rashes.  LABS:  CBC  Recent Labs Lab 04/09/15 2133  WBC 21.9*  HGB 19.2*  HCT 54.7*  PLT 229   Coag's  Recent Labs Lab 04/09/15 2133  APTT 29  INR 1.35   BMET No results for input(s): NA, K, CL, CO2, BUN, CREATININE, GLUCOSE in the last 168 hours. Electrolytes No results for input(s): CALCIUM, MG, PHOS in the last 168 hours. Sepsis Markers  Recent Labs Lab 04/09/15 2156  LATICACIDVEN 5.97*   ABG No results for input(s): PHART, PCO2ART, PO2ART in the last 168 hours. Liver Enzymes No results for input(s): AST, ALT, ALKPHOS, BILITOT, ALBUMIN in the last 168 hours. Cardiac Enzymes No results for input(s): TROPONINI, PROBNP in the last 168 hours. Glucose No results for input(s): GLUCAP in the last 168 hours.  Imaging Dg Chest Port 1 View  04/09/2015   CLINICAL DATA:  81 year old male with sepsis  EXAM: PORTABLE CHEST - 1 VIEW  COMPARISON:  Prior chest x-ray 04/13/2014  FINDINGS: Low inspiratory volumes. Nonspecific left basilar opacity favored to reflect atelectasis and chronic scarring in the lingula. Similar appearance on prior imaging. No focal airspace consolidation, pulmonary edema or pleural effusion. No pneumothorax. No large free air. No acute osseous abnormality.  IMPRESSION: Low inspiratory volumes with chronic left basilar atelectasis and/ or scarring.  No acute cardiopulmonary process.   Electronically Signed   By: Malachy Moan M.D.   On: 04/09/2015 21:25     ASSESSMENT / PLAN:  CARDIOVASCULAR CVL pending >>> A:  Shock - presumed septic with unclear etiology at this point + hypovolemic (dry on exam + hemoconcentration). P:  Inserted TLC. Norepinephrine to maintain goal MAP > 65. Goal CVP 8 - 12. Trend troponin / lactate. TTE.  INFECTIOUS A:   Leukocytosis - presumed due to septic shock from unclear etiology. P:   BCx2 6/15 > UCx 6/15 > Sputum Cx 6/15 > Abx: Vanc, start date 6/15, day 1/x. Abx: Ceftriaxone, start date 6/15, day 1/x. PCT algorithm to limit abx exposure.  PULMONARY A: Atelectasis. P:   Incentive spirometry. Repeat CXR in AM.  RENAL A:   AGMA - lactate. P:   NS @ 100. BMP in AM.  GASTROINTESTINAL A:   Nutrition. P:   NPO for now.  HEMATOLOGIC A:   Hemoconcentration. VTE Prophylaxis. P:  SCD's / Heparin. CBC in AM.  ENDOCRINE A:   Hyperglycemia on BMP - no hx DM.   P:   SSI if glucose consistently > 180. Hgb A1C. TSH.  NEUROLOGIC A:   Acute metabolic encephalopathy. Hx CVA, depression. P:   Check ammonia, vitamin B12, folate, TSH.  Family updated:  Wife and step-son at bedside.  Interdisciplinary Family Meeting v Palliative Care Meeting:  Due by: 04/16/15.  Rutherford Guys, [place] [facility] Pulmonary & Critical Care Medicine Pager: [phone]  or [phone] 04/09/2015, 48:13 PM  81 year old male with extensive PMH presenting with septic shock, unclear etiology, in shock, requiring levophed.  Continue ceftaz and vanc, f/u on culture.  Cortisol level not back.  Lactic acid level went from 6 to 4.9.  Chest exam clear.  No family bedside at the time I saw patient.  F/U on TSH and CBG.  NS at 100 ml/hr, will continue.  Levophed at 6, hopefully be able to come off today.  The patient is critically ill with multiple organ systems failure and requires high complexity decision making for assessment and support, frequent evaluation and titration of therapies, application of advanced  monitoring technologies and extensive interpretation of multiple databases.   Critical Care Time devoted to patient care services described in this note is  35  Minutes. This time reflects time of care of this signee Dr Koren Bound. This critical care time does not reflect procedure time, or teaching time or supervisory time of PA/NP/Med student/Med Resident etc but could involve care discussion time.  Alyson Reedy, M.D. [facility] Pulmonary/Critical Care Medicine. Pager: [phone]. After hours pager: [phone].

## 2015-04-09 NOTE — ED Notes (Addendum)
Pt comes via [facility] from home, pt last seen normal at 1800, family reports pt has not been acting normal all day, pt not eating all day, not talking will shake his head yes and no. Pt has hx of previous stroke

## 2015-04-10 ENCOUNTER — Inpatient Hospital Stay (HOSPITAL_COMMUNITY): Payer: Medicare Other

## 2015-04-10 DIAGNOSIS — E872 Acidosis, unspecified: Secondary | ICD-10-CM | POA: Diagnosis present

## 2015-04-10 DIAGNOSIS — R739 Hyperglycemia, unspecified: Secondary | ICD-10-CM | POA: Insufficient documentation

## 2015-04-10 DIAGNOSIS — R0902 Hypoxemia: Secondary | ICD-10-CM

## 2015-04-10 DIAGNOSIS — R410 Disorientation, unspecified: Secondary | ICD-10-CM | POA: Insufficient documentation

## 2015-04-10 DIAGNOSIS — E861 Hypovolemia: Secondary | ICD-10-CM | POA: Insufficient documentation

## 2015-04-10 DIAGNOSIS — A419 Sepsis, unspecified organism: Secondary | ICD-10-CM

## 2015-04-10 DIAGNOSIS — R7881 Bacteremia: Secondary | ICD-10-CM

## 2015-04-10 DIAGNOSIS — R4182 Altered mental status, unspecified: Secondary | ICD-10-CM

## 2015-04-10 LAB — FOLATE: Folate: 8.7 ng/mL (ref >5.9)

## 2015-04-10 LAB — AMMONIA: Ammonia: 43 umol/L — ABNORMAL HIGH (ref 9–35)

## 2015-04-10 LAB — CBC
HEMATOCRIT: 45.7 % (ref 39.0–52.0)
Hemoglobin: 15.5 g/dL (ref 13.0–17.0)
MCH: 32.6 pg (ref 26.0–34.0)
MCHC: 33.9 g/dL (ref 30.0–36.0)
MCV: 96 fL (ref 78.0–100.0)
Platelets: 177 10*3/uL (ref 150–400)
RBC: 4.76 MIL/uL (ref 4.22–5.81)
RDW: 15.1 % (ref 11.5–15.5)
WBC: 19.2 10*3/uL — ABNORMAL HIGH (ref 4.0–10.5)

## 2015-04-10 LAB — PHOSPHORUS: PHOSPHORUS: 4 mg/dL (ref 2.5–4.6)

## 2015-04-10 LAB — PROCALCITONIN: Procalcitonin: 14.1 ng/mL

## 2015-04-10 LAB — TROPONIN I: TROPONIN I: 0.06 ng/mL — AB (ref <0.031)

## 2015-04-10 LAB — CARBOXYHEMOGLOBIN
Carboxyhemoglobin: 1.2 % (ref 0.5–1.5)
METHEMOGLOBIN: 0.8 % (ref 0.0–1.5)
O2 SAT: 72.2 %
Total hemoglobin: 14.3 g/dL (ref 13.5–18.0)

## 2015-04-10 LAB — LACTIC ACID, PLASMA: LACTIC ACID, VENOUS: 4.9 mmol/L — AB (ref 0.5–2.0)

## 2015-04-10 LAB — MAGNESIUM: Magnesium: 1.6 mg/dL — ABNORMAL LOW (ref 1.7–2.4)

## 2015-04-10 LAB — MRSA PCR SCREENING: MRSA BY PCR: NEGATIVE

## 2015-04-10 LAB — BASIC METABOLIC PANEL
ANION GAP: 17 — AB (ref 5–15)
BUN: 9 mg/dL (ref 6–20)
CALCIUM: 7.6 mg/dL — AB (ref 8.9–10.3)
CO2: 16 mmol/L — ABNORMAL LOW (ref 22–32)
Chloride: 110 mmol/L (ref 101–111)
Creatinine, Ser: 0.87 mg/dL (ref 0.61–1.24)
GFR calc non Af Amer: >60 mL/min (ref >60)
Glucose, Bld: 116 mg/dL — ABNORMAL HIGH (ref 65–99)
POTASSIUM: 3.1 mmol/L — AB (ref 3.5–5.1)
SODIUM: 143 mmol/L (ref 135–145)

## 2015-04-10 LAB — GLUCOSE, CAPILLARY: GLUCOSE-CAPILLARY: 113 mg/dL — AB (ref 65–99)

## 2015-04-10 LAB — TSH: TSH: 1.993 u[IU]/mL (ref 0.350–4.500)

## 2015-04-10 LAB — VITAMIN B12: Vitamin B-12: 346 pg/mL (ref 180–914)

## 2015-04-10 MED ORDER — DEXTROSE 5 % IV SOLN
1.0000 g | Freq: Three times a day (TID) | INTRAVENOUS | Status: DC
  Administered 2015-04-10 – 2015-04-14 (×12): 1 g via INTRAVENOUS
  Filled 2015-04-10 (×16): qty 1

## 2015-04-10 MED ORDER — DEXTROSE 5 % IV SOLN
1.0000 g | Freq: Every day | INTRAVENOUS | Status: DC
  Administered 2015-04-10: 1 g via INTRAVENOUS
  Filled 2015-04-10 (×2): qty 10

## 2015-04-10 MED ORDER — SODIUM CHLORIDE 0.9 % IV BOLUS (SEPSIS): 500.0000 mL | Freq: Once | INTRAVENOUS | Status: DC

## 2015-04-10 MED ORDER — NOREPINEPHRINE BITARTRATE 1 MG/ML IV SOLN
5.0000 ug/min | INTRAVENOUS | Status: DC
  Administered 2015-04-10: 4 ug/min via INTRAVENOUS
  Administered 2015-04-10: 2 ug/min via INTRAVENOUS
  Filled 2015-04-10 (×2): qty 4

## 2015-04-10 MED ORDER — VANCOMYCIN HCL IN DEXTROSE 1-5 GM/200ML-% IV SOLN
1000.0000 mg | Freq: Two times a day (BID) | INTRAVENOUS | Status: DC
  Administered 2015-04-10 – 2015-04-11 (×3): 1000 mg via INTRAVENOUS
  Filled 2015-04-10 (×4): qty 200

## 2015-04-10 NOTE — Procedures (Signed)
Central Venous Catheter Insertion Procedure Note Howard Rhodes [id number] [birth year]  Procedure: Insertion of Central Venous Catheter Indications: Assessment of intravascular volume, Drug and/or fluid administration and Frequent blood sampling  Procedure Details Consent: Risks of procedure as well as the alternatives and risks of each were explained to the (patient/caregiver).  Consent for procedure obtained. Time Out: Verified patient identification, verified procedure, site/side was marked, verified correct patient position, special equipment/implants available, medications/allergies/relevent history reviewed, required imaging and test results available.  Performed  Maximum sterile technique was used including antiseptics, cap, gloves, gown, hand hygiene, mask and sheet. Skin prep: Chlorhexidine; local anesthetic administered A antimicrobial bonded/coated triple lumen catheter was placed in the left internal jugular vein using the Seldinger technique.  Evaluation Blood flow good Complications: No apparent complications Patient did tolerate procedure well. Chest X-ray ordered to verify placement.  CXR: pending.  Procedure performed under direct ultrasound guidance for real time vessel cannulation.      Rutherford Guys, [place] [facility] [place] Pulmonary & Critical Care Medicine Pager: [phone]  or [phone] 04/10/2015, 3:25 AM

## 2015-04-10 NOTE — Progress Notes (Signed)
CRITICAL VALUE ALERT  Critical value received:  Positive blood cultures gram neg rods in anaerobic bottle  Date of notification:  04/10/2015  Time of notification:  0939  Critical value read back: yes  Nurse who received alert:  Cher Nakai   MD notified (1st page):  Dr. Molli Knock  Time of first page:  In person 58  MD notified (2nd page):  Time of second page:  Responding MD:  Dr. Molli Knock  Time MD responded:  In person

## 2015-04-10 NOTE — Progress Notes (Signed)
CRITICAL VALUE ALERT  Critical value received:  Lactic acid 4.9  Date of notification:  04/10/2015  Time of notification:  0748  Critical value read back: yes  Nurse who received alert:  Cher Nakai   MD notified (1st page):  Dr. Molli Knock  Time of first page:  [phone]  MD notified (2nd page):  Time of second page:  Responding MD:  Dr. Molli Knock  Time MD responded:  [phone]

## 2015-04-10 NOTE — ED Notes (Signed)
Attempted report 

## 2015-04-10 NOTE — Progress Notes (Signed)
eLink Physician-Brief Progress Note Patient Name: Howard Rhodes DOB: 1931-07-14 MRN: [id number]   Date of Service  04/10/2015  HPI/Events of Note  pcxr left ij to rt ij  eICU Interventions  Pul l back 4 cm     Intervention Category Major Interventions: Respiratory failure - evaluation and management;Sepsis - evaluation and management  FEINSTEIN,DANIEL J. 04/10/2015, 3:59 AM

## 2015-04-11 DIAGNOSIS — R6521 Severe sepsis with septic shock: Secondary | ICD-10-CM

## 2015-04-11 LAB — BASIC METABOLIC PANEL
ANION GAP: 8 (ref 5–15)
Anion gap: 9 (ref 5–15)
BUN: 8 mg/dL (ref 6–20)
BUN: 10 mg/dL (ref 6–20)
CHLORIDE: 108 mmol/L (ref 101–111)
CHLORIDE: 105 mmol/L (ref 101–111)
CO2: 23 mmol/L (ref 22–32)
CO2: 21 mmol/L — ABNORMAL LOW (ref 22–32)
Calcium: 7.9 mg/dL — ABNORMAL LOW (ref 8.9–10.3)
Calcium: 7.9 mg/dL — ABNORMAL LOW (ref 8.9–10.3)
Creatinine, Ser: 0.64 mg/dL (ref 0.61–1.24)
Creatinine, Ser: 0.51 mg/dL — ABNORMAL LOW (ref 0.61–1.24)
GFR calc Af Amer: >60 mL/min (ref >60)
GFR calc non Af Amer: >60 mL/min (ref >60)
GFR calc non Af Amer: >60 mL/min (ref >60)
GLUCOSE: 116 mg/dL — AB (ref 65–99)
GLUCOSE: 172 mg/dL — AB (ref 65–99)
POTASSIUM: 2.6 mmol/L — AB (ref 3.5–5.1)
Potassium: 4.3 mmol/L (ref 3.5–5.1)
SODIUM: 135 mmol/L (ref 135–145)
Sodium: 139 mmol/L (ref 135–145)

## 2015-04-11 LAB — PROCALCITONIN: Procalcitonin: 5.46 ng/mL

## 2015-04-11 LAB — URINE CULTURE: Culture: NO GROWTH

## 2015-04-11 LAB — HEMOGLOBIN A1C
Hgb A1c MFr Bld: 5.7 % — ABNORMAL HIGH (ref 4.8–5.6)
MEAN PLASMA GLUCOSE: 117 mg/dL

## 2015-04-11 LAB — PHOSPHORUS: Phosphorus: 1.7 mg/dL — ABNORMAL LOW (ref 2.5–4.6)

## 2015-04-11 LAB — MAGNESIUM: Magnesium: 1.6 mg/dL — ABNORMAL LOW (ref 1.7–2.4)

## 2015-04-11 MED ORDER — MAGNESIUM SULFATE 2 GM/50ML IV SOLN
2.0000 g | Freq: Once | INTRAVENOUS | Status: AC
  Administered 2015-04-11: 2 g via INTRAVENOUS
  Filled 2015-04-11: qty 50

## 2015-04-11 MED ORDER — POTASSIUM CHLORIDE CRYS ER 20 MEQ PO TBCR
40.0000 meq | EXTENDED_RELEASE_TABLET | ORAL | Status: AC
  Administered 2015-04-11 (×3): 40 meq via ORAL
  Filled 2015-04-11 (×4): qty 2

## 2015-04-11 MED ORDER — SODIUM CHLORIDE 0.9 % IV SOLN
20.0000 mmol | Freq: Once | INTRAVENOUS | Status: AC
  Administered 2015-04-11: 20 mmol via INTRAVENOUS
  Filled 2015-04-11: qty 6.67

## 2015-04-11 MED ORDER — ATORVASTATIN CALCIUM 40 MG PO TABS
40.0000 mg | ORAL_TABLET | Freq: Every day | ORAL | Status: DC
  Administered 2015-04-11: 40 mg via ORAL
  Filled 2015-04-11 (×2): qty 1

## 2015-04-11 MED ORDER — ENOXAPARIN SODIUM 40 MG/0.4ML ~~LOC~~ SOLN
40.0000 mg | Freq: Every day | SUBCUTANEOUS | Status: DC
  Administered 2015-04-11 – 2015-04-18 (×8): 40 mg via SUBCUTANEOUS
  Filled 2015-04-11 (×8): qty 0.4

## 2015-04-11 NOTE — Progress Notes (Signed)
[facility] ADULT ICU REPLACEMENT PROTOCOL FOR AM LAB REPLACEMENT ONLY  The patient does apply for the [facility] Adult ICU Electrolyte Replacment Protocol based on the criteria listed below:   1. Is GFR >/= 40 ml/min? Yes.    Patient's GFR today is >60 2. Is urine output >/= 0.5 ml/kg/hr for the last 6 hours? Yes.   Patient's UOP is 1.2 ml/kg/hr 3. Is BUN < 60 mg/dL? Yes.    Patient's BUN today is 8 4. Abnormal electrolyte(s): Potassium 2.6, Magnesium 1.6, Phosphate 1.7 5. Ordered repletion with: [facility] adult ICU replacement protocol 6. If a panic level lab has been reported, has the CCM MD in charge been notified? Yes.  .   Physician:  Dr. Rory Percy  [facility], Alda Berthold E 04/11/2015 5:04 AM

## 2015-04-11 NOTE — Progress Notes (Signed)
CRITICAL VALUE ALERT  Critical value received:  K 2.6   Date of notification:  6/17  Time of notification:  0452   Critical value read back: Yes  Nurse who received alert:  Wende Neighbors RN   MD notified (1st page):  MD Tyson Alias  Time of first page:  [phone]  MD notified (2nd page):  Time of second page:  Responding MD:  Darliss Cheney RN   Time MD responded:  [phone]

## 2015-04-11 NOTE — Progress Notes (Signed)
Notified MD Tyson Alias in regards to obtaining BMET, Mag, Phos due to potassium being 3.1 on 6/16 at 0515.

## 2015-04-11 NOTE — Progress Notes (Signed)
No distress No complaints  Filed Vitals:   04/11/15 1300 04/11/15 1400 04/11/15 1528 04/11/15 1622  BP: 113/64 138/74 132/69   Pulse: 102 119 102   Temp:    99.3 F (37.4 C)  TempSrc:    Oral  Resp: 28 27    Height:      Weight:      SpO2: 94% 95% 95%    NAD HEENT WNL Chest clear RRR s M NABS, soft Ext warm, no edema B foot drop No focal neuro deficits   BMET    Component Value Date/Time   NA 139 04/11/2015 0355   K 2.6* 04/11/2015 0355   CL 108 04/11/2015 0355   CO2 23 04/11/2015 0355   GLUCOSE 116* 04/11/2015 0355   BUN 8 04/11/2015 0355   CREATININE 0.64 04/11/2015 0355   CALCIUM 7.9* 04/11/2015 0355   GFRNONAA >60 04/11/2015 0355   GFRAA >60 04/11/2015 0355    CBC    Component Value Date/Time   WBC 19.2* 04/10/2015 0515   RBC 4.76 04/10/2015 0515   HGB 15.5 04/10/2015 0515   HCT 45.7 04/10/2015 0515   PLT 177 04/10/2015 0515   MCV 96.0 04/10/2015 0515   MCH 32.6 04/10/2015 0515   MCHC 33.9 04/10/2015 0515   RDW 15.1 04/10/2015 0515   LYMPHSABS 0.5* 04/09/2015 2133   MONOABS 0.6 04/09/2015 2133   EOSABS 0.0 04/09/2015 2133   BASOSABS 0.0 04/09/2015 2133    CXR: No new film  Blood cx: 2/2 GNRs   IMPRESSION: GNR bacteremia - unclear source Severe sepsis/shock resolved  PLAN:  DC vanc Cont ceftaz Advance activity and diet Transfer to med-surg floor TRH to assume care as of AM 6/18 and PCCM to sign off  Discussed with Dr Chanda Busing, MD ; [facility] service Mobile [phone].  After 5:30 PM or weekends, call [phone]

## 2015-04-12 ENCOUNTER — Inpatient Hospital Stay (HOSPITAL_COMMUNITY): Payer: Medicare Other

## 2015-04-12 DIAGNOSIS — R740 Nonspecific elevation of levels of transaminase and lactic acid dehydrogenase [LDH]: Secondary | ICD-10-CM

## 2015-04-12 DIAGNOSIS — G9340 Encephalopathy, unspecified: Secondary | ICD-10-CM | POA: Diagnosis present

## 2015-04-12 DIAGNOSIS — R7881 Bacteremia: Secondary | ICD-10-CM | POA: Diagnosis present

## 2015-04-12 DIAGNOSIS — R7401 Elevation of levels of liver transaminase levels: Secondary | ICD-10-CM | POA: Clinically undetermined

## 2015-04-12 LAB — HEPATIC FUNCTION PANEL
ALT: 92 U/L — ABNORMAL HIGH (ref 17–63)
AST: 98 U/L — ABNORMAL HIGH (ref 15–41)
Albumin: 2.1 g/dL — ABNORMAL LOW (ref 3.5–5.0)
Alkaline Phosphatase: 230 U/L — ABNORMAL HIGH (ref 38–126)
BILIRUBIN DIRECT: 2.4 mg/dL — AB (ref 0.1–0.5)
BILIRUBIN INDIRECT: 2 mg/dL — AB (ref 0.3–0.9)
BILIRUBIN TOTAL: 4.4 mg/dL — AB (ref 0.3–1.2)
Total Protein: 5.2 g/dL — ABNORMAL LOW (ref 6.5–8.1)

## 2015-04-12 LAB — BASIC METABOLIC PANEL
Anion gap: 9 (ref 5–15)
BUN: 9 mg/dL (ref 6–20)
CALCIUM: 7.8 mg/dL — AB (ref 8.9–10.3)
CO2: 22 mmol/L (ref 22–32)
Chloride: 103 mmol/L (ref 101–111)
Creatinine, Ser: 0.51 mg/dL — ABNORMAL LOW (ref 0.61–1.24)
GFR calc Af Amer: >60 mL/min (ref >60)
GLUCOSE: 110 mg/dL — AB (ref 65–99)
Potassium: 3.5 mmol/L (ref 3.5–5.1)
Sodium: 134 mmol/L — ABNORMAL LOW (ref 135–145)

## 2015-04-12 LAB — CBC
HCT: 38.9 % — ABNORMAL LOW (ref 39.0–52.0)
Hemoglobin: 13.6 g/dL (ref 13.0–17.0)
MCH: 32.2 pg (ref 26.0–34.0)
MCHC: 35 g/dL (ref 30.0–36.0)
MCV: 92 fL (ref 78.0–100.0)
Platelets: 156 10*3/uL (ref 150–400)
RBC: 4.23 MIL/uL (ref 4.22–5.81)
RDW: 15.1 % (ref 11.5–15.5)
WBC: 12.1 10*3/uL — ABNORMAL HIGH (ref 4.0–10.5)

## 2015-04-12 LAB — CULTURE, BLOOD (ROUTINE X 2)

## 2015-04-12 LAB — PROCALCITONIN: Procalcitonin: 3.23 ng/mL

## 2015-04-12 LAB — LACTIC ACID, PLASMA: Lactic Acid, Venous: 2.4 mmol/L (ref 0.5–2.0)

## 2015-04-12 LAB — MAGNESIUM: MAGNESIUM: 2 mg/dL (ref 1.7–2.4)

## 2015-04-12 MED ORDER — ACETAMINOPHEN 325 MG PO TABS
650.0000 mg | ORAL_TABLET | Freq: Four times a day (QID) | ORAL | Status: DC | PRN
  Administered 2015-04-12: 650 mg via ORAL
  Filled 2015-04-12: qty 2

## 2015-04-12 MED ORDER — ONDANSETRON HCL 4 MG/2ML IJ SOLN: 4.0000 mg | Freq: Four times a day (QID) | INTRAMUSCULAR | Status: DC | PRN

## 2015-04-12 MED ORDER — POTASSIUM CHLORIDE CRYS ER 20 MEQ PO TBCR
40.0000 meq | EXTENDED_RELEASE_TABLET | Freq: Once | ORAL | Status: AC
  Administered 2015-04-12: 40 meq via ORAL
  Filled 2015-04-12: qty 2

## 2015-04-12 MED ORDER — LEVALBUTEROL HCL 0.63 MG/3ML IN NEBU
0.6300 mg | INHALATION_SOLUTION | Freq: Three times a day (TID) | RESPIRATORY_TRACT | Status: DC
Start: 2015-04-12 — End: 2015-04-12
  Administered 2015-04-12 (×2): 0.63 mg via RESPIRATORY_TRACT
  Filled 2015-04-12 (×3): qty 3

## 2015-04-12 MED ORDER — LEVALBUTEROL HCL 0.63 MG/3ML IN NEBU: 0.6300 mg | INHALATION_SOLUTION | Freq: Four times a day (QID) | RESPIRATORY_TRACT | Status: DC | PRN

## 2015-04-12 NOTE — Evaluation (Signed)
Clinical/Bedside Swallow Evaluation Patient Details  Name: Howard Rhodes MRN: [id number] Date of Birth: 1931/04/23  Today's Date: 04/12/2015 Time: SLP Start Time (ACUTE ONLY): 0320 SLP Stop Time (ACUTE ONLY): 0346 SLP Time Calculation (min) (ACUTE ONLY): 26 min  Past Medical History:  Past Medical History  Diagnosis Date  . HLD (hyperlipidemia)   . Arthritis   . Anemia   . Depression   . Hip fracture   . Stroke    Past Surgical History:  Past Surgical History  Procedure Laterality Date  . Hernia repair    . Femur im nail Left 03/09/2013    Procedure: INTRAMEDULLARY (IM) NAIL FEMORAL;  Surgeon: Verlee Rossetti, MD;  Location: [facility];  Service: Orthopedics;  Laterality: Left;   HPI:  81 year old Caucasian male with past medical history of arthritis, anemia, hyperlipidemia, presented to the emergency department with altered mental status. History was provided by his wife. . In the emergency department he was noted to be hypotensive with blood pressures in the 80s. temperature was 103. Chest x ray showerd increased left lower lobe/ retrocardiac consolidation which could represent atelectasis   Assessment / Plan / Recommendation Clinical Impression  Pt requiring a lot of cueing during bedside swallow with decreased mentation and high distractibility. Family present who reports intermittent periods of coughing during eating prior to admission, on liquids. Pts swallow appearing functional upon clinical observation this date.  One instance of coughing noted following thin liquid by straw out of multiple trials. RN reports no distress during eating. Continue thin liquid regular diet at this time. Objective swallow study may be indicated in the future to rule out aspiration. ST to follow up for diet tolerance and potential objective swallowing study.       Aspiration Risk  Mild    Diet Recommendation Thin;Age appropriate regular solids   Medication Administration: Whole meds with  liquid Compensations: Slow rate;Small sips/bites;Minimize environmental distractions;Follow solids with liquid    Other  Recommendations Oral Care Recommendations: Oral care BID   Follow Up Recommendations       Frequency and Duration min 1 x/week  1 week   Pertinent Vitals/Pain     SLP Swallow Goals     Swallow Study Prior Functional Status       General Date of Onset: 04/09/15 Other Pertinent Information: 81 year old Caucasian male with past medical history of arthritis, anemia, hyperlipidemia, presented to the emergency department with altered mental status. History was provided by his wife. . In the emergency department he was noted to be hypotensive with blood pressures in the 80s. temperature was 103. Chest x ray showerd increased left lower lobe/ retrocardiac consolidation which could represent atelectasis Type of Study: Bedside swallow evaluation Temperature Spikes Noted: Yes Respiratory Status: Room air History of Recent Intubation: No Behavior/Cognition: Confused;Requires cueing;Distractible Oral Cavity - Dentition: Edentulous (upper and lower dentures present) Self-Feeding Abilities: Able to feed self;Needs assist Patient Positioning: Upright in bed Baseline Vocal Quality: Normal Volitional Cough: Strong Volitional Swallow: Able to elicit    Oral/Motor/Sensory Function Overall Oral Motor/Sensory Function: Appears within functional limits for tasks assessed Labial ROM: Within Functional Limits Labial Symmetry: Within Functional Limits Labial Strength: Within Functional Limits Lingual Symmetry: Within Functional Limits Lingual Strength: Within Functional Limits Facial Symmetry: Within Functional Limits   Ice Chips Ice chips: Within functional limits Presentation: Spoon   Thin Liquid Thin Liquid: Impaired Oral Phase Impairments: Impaired anterior to posterior transit Oral Phase Functional Implications: Oral holding;Prolonged oral transit Pharyngeal  Phase  Impairments: Suspected delayed  Swallow;Cough - Immediate    Nectar Thick Nectar Thick Liquid: Not tested   Honey Thick Honey Thick Liquid: Not tested   Puree Puree: Within functional limits   Solid   GO   Marcene Duos MA, CCC-SLP Acute Care Speech Language Pathologist     Solid: Within functional limits       Kennieth Rad 04/12/2015,3:53 PM

## 2015-04-12 NOTE — Progress Notes (Signed)
CRITICAL VALUE ALERT  Critical value received:  Lactic acid 2.4  Date of notification:  04/12/2015  Time of notification:  12:22 PM  Critical value read back:Yes.    Nurse who received alert:  Cleta Alberts  MD notified (1st page):  Dr. Rito Ehrlich  Time of first page:  12:23 PM  MD notified (2nd page):  Time of second page:  Responding MD:  Dr. Rito Ehrlich  Time MD responded:

## 2015-04-12 NOTE — Progress Notes (Signed)
Radiology contacted RN to ask if pt has been NPO for ultrasound abd. Pt needs to be NPO and radiology stated they will attempt tomorrow.

## 2015-04-12 NOTE — Progress Notes (Signed)
Dr. Rito Ehrlich made aware of patient's temperature of 101.52F and patient's current status. MD to evaluate patient. No new orders received at this time.

## 2015-04-12 NOTE — Progress Notes (Signed)
[facility] PROGRESS NOTE  Marus Woolley WGY:[id number] DOB: January 27, 1931 DOA: 04/09/2015  PCP: Ailene Ravel, MD  Brief HPI: 81 year old Caucasian male with past medical history of arthritis, anemia, hyperlipidemia, presented to the emergency department with altered mental status. History was provided by his wife. Apparently he had been getting more more confused over the last 2 days prior to the day of admission. In the emergency department he was noted to be hypotensive with blood pressures in the 80s. temperature was 103. Lactic acid was 5.9 with a WBC of 22. He was admitted to the intensive care unit. He required pressors. He was weaned off of pressors and then was transferred to the floor. Blood cultures are growing gram-negative rods. Source of infection is not clear.  Past medical history:  Past Medical History  Diagnosis Date  . HLD (hyperlipidemia)   . Arthritis   . Anemia   . Depression   . Hip fracture   . Stroke     Consultants: None  Procedures: Central line was placed  Antibiotics: Fortaz  Subjective: Patient remains confused. He denies any pain. He is awake, alert. Denies any headaches.  Objective: Vital Signs  Filed Vitals:   04/12/15 0504 04/12/15 0808 04/12/15 0816 04/12/15 1032  BP: 118/68  117/62   Pulse: 114  99   Temp: 99.6 F (37.6 C) 101.1 F (38.4 C)  99.4 F (37.4 C)  TempSrc: Oral Oral Oral Oral  Resp: 24  28   Height:      Weight:      SpO2: 94%  94%     Intake/Output Summary (Last 24 hours) at 04/12/15 1256 Last data filed at 04/12/15 1250  Gross per 24 hour  Intake    240 ml  Output   1200 ml  Net   -960 ml   Filed Weights   04/10/15 0300 04/11/15 0356  Weight: 86.6 kg (190 lb 14.7 oz) 94.2 kg (207 lb 10.8 oz)    General appearance: alert, cooperative, distracted and no distress Resp: Wheezing is heard bilaterally with a few crackles at the bases. No rhonchi. Cardio: regular rate and rhythm, S1, S2 normal, no murmur,  click, rub or gallop GI: soft, non-tender; bowel sounds normal; no masses,  no organomegaly Extremities: extremities normal, atraumatic, no cyanosis or edema Neurologic: Alert, distracted, disoriented. No facial asymmetry. Tongue is midline. Motor strength equal but is noted to have generalized weakness.  Lab Results:  Basic Metabolic Panel:  Recent Labs Lab 04/10/15 0515 04/11/15 0355 04/11/15 1940 04/12/15 0515 04/12/15 1047  NA 143 139 135 134*  --   K 3.1* 2.6* 4.3 3.5  --   CL 110 108 105 103  --   CO2 16* 23 21* 22  --   GLUCOSE 116* 116* 172* 110*  --   BUN 9 8 10 9   --   CREATININE 0.87 0.64 0.51* 0.51*  --   CALCIUM 7.6* 7.9* 7.9* 7.8*  --   MG 1.6* 1.6*  --   --  2.0  PHOS 4.0 1.7*  --   --   --    Liver Function Tests:  Recent Labs Lab 04/12/15 1047  AST 98*  ALT 92*  ALKPHOS 230*  BILITOT 4.4*  PROT 5.2*  ALBUMIN 2.1*    Recent Labs Lab 04/10/15 0515  AMMONIA 43*   CBC:  Recent Labs Lab 04/09/15 2133 04/10/15 0515 04/12/15 0515  WBC 21.9* 19.2* 12.1*  NEUTROABS 20.8*  --   --  HGB 19.2* 15.5 13.6  HCT 54.7* 45.7 38.9*  MCV 95.3 96.0 92.0  PLT 229 177 156   Cardiac Enzymes:  Recent Labs Lab 04/10/15 0520  TROPONINI 0.06*   BNP (last 3 results)  Recent Labs  04/09/15 2133  BNP 118.4*    CBG:  Recent Labs Lab 04/10/15 0409  GLUCAP 113*    Recent Results (from the past 240 hour(s))  Blood Culture (routine x 2)     Status: None   Collection Time: 04/09/15  9:33 PM  Result Value Ref Range Status   Specimen Description BLOOD RIGHT FOREARM  Final   Special Requests   Final    BOTTLES DRAWN AEROBIC AND ANAEROBIC BLUE 5 CC RED 3 CC   Culture  Setup Time   Final    GRAM NEGATIVE RODS IN BOTH AEROBIC AND ANAEROBIC BOTTLES CRITICAL RESULT CALLED TO, READ BACK BY AND VERIFIED WITH: L SATTERFIELD, RN AT [phone] 04/10/15 L BENFIELD    Culture   Final    KLEBSIELLA PNEUMONIAE SUSCEPTIBILITIES PERFORMED ON PREVIOUS CULTURE WITHIN  THE LAST 5 DAYS.    Report Status 04/12/2015 FINAL  Final  Blood Culture (routine x 2)     Status: None   Collection Time: 04/09/15  9:45 PM  Result Value Ref Range Status   Specimen Description BLOOD RIGHT HAND  Final   Special Requests BOTTLES DRAWN AEROBIC AND ANAEROBIC 10 CC  Final   Culture  Setup Time   Final    GRAM NEGATIVE RODS IN BOTH AEROBIC AND ANAEROBIC BOTTLES CRITICAL RESULT CALLED TO, READ BACK BY AND VERIFIED WITH: L SATTERFIELD,RN AT [id number] 04/10/15 L BENFIELD    Culture KLEBSIELLA PNEUMONIAE  Final   Report Status 04/12/2015 FINAL  Final   Organism ID, Bacteria KLEBSIELLA PNEUMONIAE  Final      Susceptibility   Klebsiella pneumoniae - MIC*    AMPICILLIN 8 RESISTANT Resistant     CEFAZOLIN <=4 SENSITIVE Sensitive     CEFEPIME <=1 SENSITIVE Sensitive     CEFTAZIDIME <=1 SENSITIVE Sensitive     CEFTRIAXONE <=1 SENSITIVE Sensitive     CIPROFLOXACIN <=0.25 SENSITIVE Sensitive     GENTAMICIN <=1 SENSITIVE Sensitive     IMIPENEM <=0.25 SENSITIVE Sensitive     TRIMETH/SULFA <=20 SENSITIVE Sensitive     AMPICILLIN/SULBACTAM <=2 SENSITIVE Sensitive     PIP/TAZO <=4 SENSITIVE Sensitive     * KLEBSIELLA PNEUMONIAE  Urine culture     Status: None   Collection Time: 04/09/15  9:59 PM  Result Value Ref Range Status   Specimen Description URINE, CATHETERIZED  Final   Special Requests NONE  Final   Culture NO GROWTH 2 DAYS  Final   Report Status 04/11/2015 FINAL  Final  MRSA PCR Screening     Status: None   Collection Time: 04/10/15  3:11 AM  Result Value Ref Range Status   MRSA by PCR NEGATIVE NEGATIVE Final    Comment:        The GeneXpert MRSA Assay (FDA approved for NASAL specimens only), is one component of a comprehensive MRSA colonization surveillance program. It is not intended to diagnose MRSA infection nor to guide or monitor treatment for MRSA infections.       Studies/Results: Dg Chest 2 View  04/12/2015   CLINICAL DATA:  Cough  EXAM: CHEST  2 VIEW   COMPARISON:  04/10/2015  FINDINGS: Aeration is decreased since previously with persistent and increased left lower lobe consolidation. Trace left pleural fluid. Moderate enlargement  of the cardiomediastinal silhouette is noted.  IMPRESSION: Decreased aeration since previously with increased left lower lobe/ retrocardiac consolidation which could represent atelectasis although pneumonia could appear similar.   Electronically Signed   By: Christiana Pellant M.D.   On: 04/12/2015 11:50   Ct Head Wo Contrast  04/12/2015   CLINICAL DATA:  Confusion, prior stroke  EXAM: CT HEAD WITHOUT CONTRAST  TECHNIQUE: Contiguous axial images were obtained from the base of the skull through the vertex without intravenous contrast.  COMPARISON:  None.  FINDINGS: Mild cortical volume loss noted with proportional ventricular prominence. Areas of periventricular white matter hypodensity are most compatible with small vessel ischemic change. No acute hemorrhage, infarct, or mass lesion is identified. Minimal ethmoid mucoperiosteal thickening. No skull fracture.  IMPRESSION: Chronic findings as above without acute intra cranial abnormality.   Electronically Signed   By: Christiana Pellant M.D.   On: 04/12/2015 12:07    Medications:  Scheduled: . cefTAZidime (FORTAZ)  IV  1 g Intravenous Q8H  . enoxaparin (LOVENOX) injection  40 mg Subcutaneous Daily  . levalbuterol  0.63 mg Nebulization Q8H  . sodium chloride  500 mL Intravenous Once   Continuous:  ZOX:WRUEAV chloride, acetaminophen, ondansetron (ZOFRAN) IV  Assessment/Plan:  Active Problems:   Septic shock   Lactic acidosis   Acute encephalopathy   Transaminitis   Gram-negative bacteremia   Acute encephalopathy Likely secondary to sepsis. CT head does not show any acute changes. He does not have any focal deficits. Ammonia level was mildly elevated. LFTs are mildly abnormal as well. He does not have any right upper quadrant tenderness. Continue to treat infection and  monitor.  Sepsis secondary to gram-negative bacteremia/Klebsiella Source is unclear. Chest x-ray today did suggest infection in the left lung. Continue Elita Quick. UA did not suggest infection and urine cultures did not have any growth. Patient continues to be febrile, although his lactic acid level is now better. Repeat blood cultures.  Transaminitis with hyperbilirubinemia Could be due to sepsis. Abdomen is benign. Repeat in the morning. Get ultrasound. Stop his statin for now.  Wheezing Nebulizer treatments. Chest x-ray as above.  DVT Prophylaxis: Lovenox    Code Status: Full code  Family Communication: No family at bedside  Disposition Plan: Continue current management. PT and OT to evaluate.    LOS: 3 days   [facility]  [facility] Pager [phone] 04/12/2015, 12:56 PM  If 7PM-7AM, please contact night-coverage at [email or web address], password [facility]

## 2015-04-13 ENCOUNTER — Inpatient Hospital Stay (HOSPITAL_COMMUNITY): Payer: Medicare Other

## 2015-04-13 DIAGNOSIS — G9340 Encephalopathy, unspecified: Secondary | ICD-10-CM

## 2015-04-13 DIAGNOSIS — R7881 Bacteremia: Secondary | ICD-10-CM

## 2015-04-13 DIAGNOSIS — A4150 Gram-negative sepsis, unspecified: Principal | ICD-10-CM

## 2015-04-13 LAB — COMPREHENSIVE METABOLIC PANEL
ALT: 81 U/L — ABNORMAL HIGH (ref 17–63)
AST: 84 U/L — ABNORMAL HIGH (ref 15–41)
Albumin: 2 g/dL — ABNORMAL LOW (ref 3.5–5.0)
Alkaline Phosphatase: 224 U/L — ABNORMAL HIGH (ref 38–126)
Anion gap: 6 (ref 5–15)
BUN: <5 mg/dL — ABNORMAL LOW (ref 6–20)
CHLORIDE: 102 mmol/L (ref 101–111)
CO2: 25 mmol/L (ref 22–32)
CREATININE: 0.56 mg/dL — AB (ref 0.61–1.24)
Calcium: 7.9 mg/dL — ABNORMAL LOW (ref 8.9–10.3)
GFR calc Af Amer: >60 mL/min (ref >60)
GFR calc non Af Amer: >60 mL/min (ref >60)
Glucose, Bld: 102 mg/dL — ABNORMAL HIGH (ref 65–99)
POTASSIUM: 3.6 mmol/L (ref 3.5–5.1)
SODIUM: 133 mmol/L — AB (ref 135–145)
Total Bilirubin: 3.7 mg/dL — ABNORMAL HIGH (ref 0.3–1.2)
Total Protein: 5.1 g/dL — ABNORMAL LOW (ref 6.5–8.1)

## 2015-04-13 LAB — CBC
HCT: 38.9 % — ABNORMAL LOW (ref 39.0–52.0)
HEMOGLOBIN: 13.7 g/dL (ref 13.0–17.0)
MCH: 31.7 pg (ref 26.0–34.0)
MCHC: 35.2 g/dL (ref 30.0–36.0)
MCV: 90 fL (ref 78.0–100.0)
Platelets: 167 10*3/uL (ref 150–400)
RBC: 4.32 MIL/uL (ref 4.22–5.81)
RDW: 14.7 % (ref 11.5–15.5)
WBC: 12.4 10*3/uL — AB (ref 4.0–10.5)

## 2015-04-13 LAB — AMMONIA: AMMONIA: 41 umol/L — AB (ref 9–35)

## 2015-04-13 NOTE — Progress Notes (Signed)
Patient ID: Howard Rhodes, male   DOB: 04/28/1931, 81 y.o.   MRN: [id number]  [facility] PROGRESS NOTE  Kha Hari WUJ:[id number] DOB: 1931-08-23 DOA: 04/09/2015 PCP: Ailene Ravel, MD   Brief narrative:    81 year old male with past medical history of arthritis, anemia, HLD, presented to [facility] ED for evaluation of confusion, lethargy, poor oral intake 2-3 days in duration.   In ED, pt noted to be altered, VS notable for SBP in 80's with T 103 F, blood work notable for lactic acid 5.9, WBC 22K, blood cultures with g-rods of unclear etiology. Initially admitted under PCCM care requiring pressor support and transferred to [facility] 6/18 for continuation of care.   Assessment/Plan:    Active Problems: Acute encephalopathy - determined to be secondary to sepsis - pt is alert this am and follows some commands but non verbal - continue treating sepsis  - PT/OT requested   Sepsis secondary to gram-negative bacteremia/Klebsiella - source is unclear but per CXR, PNA in the left lung is possible source - continue [place] - sepsis etiology seems to be resolving slowly  - WBC is trending down from 21.9 K to 12.4 K this AM, Tmax 99.3 F, acidosis clearing up - repeat blood cultures form 6/18 with no growth to date  Transaminitis with hyperbilirubinemia - Could be due to sepsis. Abdomen is benign and abd [place] with hepatic steatosis, some sludge but no evidence of cholecystitis  - statin temporarily on hold  - continue to monitor LFT's   Hypomagnesemia with hypokalemia - both electrolytes supplemented and WNL  - repeat BMP and Mg in AM  Acute respiratory failure - with wheezing 6/18  - CXR with possible underlying PNA - Elita Quick already on board - continue BD's as needed   Moderate PCM - in the context of acute illness - nutritionist consulted  - SLP done, thin diet recommended   Obesity - Body mass index is 35.63 kg/(m^2).  DVT prophylaxis - Lovenox SQ  Code Status: Full.  Family  Communication:  No family at bedside  Disposition Plan: Not ready for D/C   IV access:  Peripheral IV  Procedures and diagnostic studies:    Dg Chest 2 View 04/12/2015  Decreased aeration since previously with increased left lower lobe/ retrocardiac consolidation which could represent atelectasis although pneumonia could appear similar.     Ct Head Wo Contrast 04/12/2015  Chronic findings as above without acute intra cranial abnormality.     US Abdomen Complete 04/13/2015    Hepatic increased echogenicity likely indicates underlying steatosis without focal abnormality or intrahepatic ductal dilatation.  Gallbladder sludge without other sonographic evidence for acute cholecystitis.     Dg Chest Portable 1 View 04/10/2015  Central venous catheter tip localizes over the expected location of the junction between the left brachiocephalic vein and SVC  Dg Chest Port 1 View 04/09/2015  Low inspiratory volumes with chronic left basilar atelectasis and/ or scarring.   Medical Consultants:  None  Other Consultants:  PT OT  IAnti-Infectives:   Elita Quick 6/16 -->  Vanc and Rocephin x 1 dose given on admission 6/15   Debbora Presto, MD  [facility] Pager [phone]  If 7PM-7AM, please contact night-coverage [email or web address] Password [facility] 04/13/2015, 12:39 PM   LOS: 4 days   HPI/Subjective: No events overnight.   Objective: Filed Vitals:   04/12/15 1413 04/12/15 2138 04/12/15 2219 04/13/15 0529  BP:  130/72  109/61  Pulse:  89  91  Temp:  99.7 F (37.6 C)  99.3  F (37.4 C)  TempSrc:  Oral  Oral  Resp:  20  18  Height:      Weight:      SpO2: 95% 97% 94% 95%    Intake/Output Summary (Last 24 hours) at 04/13/15 1239 Last data filed at 04/13/15 1610  Gross per 24 hour  Intake    730 ml  Output    450 ml  Net    280 ml    Exam:   General:  Pt is alert, follows commands appropriately, not in acute distress, non verbal   Cardiovascular: Regular rate and rhythm, S1/S2, no  murmurs, no rubs, no gallops  Respiratory: Clear to auscultation bilaterally, no wheezing, diminished breath sounds at bases   Abdomen: Soft, non tender, non distended, bowel sounds present, no guarding  Data Reviewed: Basic Metabolic Panel:  Recent Labs Lab 04/10/15 0515 04/11/15 0355 04/11/15 1940 04/12/15 0515 04/12/15 1047 04/13/15 0347  NA 143 139 135 134*  --  133*  K 3.1* 2.6* 4.3 3.5  --  3.6  CL 110 108 105 103  --  102  CO2 16* 23 21* 22  --  25  GLUCOSE 116* 116* 172* 110*  --  102*  BUN --  <5*  CREATININE 0.87 0.64 0.51* 0.51*  --  0.56*  CALCIUM 7.6* 7.9* 7.9* 7.8*  --  7.9*  MG 1.6* 1.6*  --   --  2.0  --   PHOS 4.0 1.7*  --   --   --   --    Liver Function Tests:  Recent Labs Lab 04/12/15 1047 04/13/15 0347  AST 98* 84*  ALT 92* 81*  ALKPHOS 230* 224*  BILITOT 4.4* 3.7*  PROT 5.2* 5.1*  ALBUMIN 2.1* 2.0*    Recent Labs Lab 04/10/15 0515 04/13/15 0347  AMMONIA 43* 41*   CBC:  Recent Labs Lab 04/09/15 2133 04/10/15 0515 04/12/15 0515 04/13/15 0347  WBC 21.9* 19.2* 12.1* 12.4*  NEUTROABS 20.8*  --   --   --   HGB 19.2* 15.5 13.6 13.7  HCT 54.7* 45.7 38.9* 38.9*  MCV 95.3 96.0 92.0 90.0  PLT 229 177 156 167   Cardiac Enzymes:  Recent Labs Lab 04/10/15 0520  TROPONINI 0.06*   CBG:  Recent Labs Lab 04/10/15 0409  GLUCAP 113*    Recent Results (from the past 240 hour(s))  Blood Culture (routine x 2)     Status: None   Collection Time: 04/09/15  9:33 PM  Result Value Ref Range Status   Specimen Description BLOOD RIGHT FOREARM  Final   Special Requests   Final    BOTTLES DRAWN AEROBIC AND ANAEROBIC BLUE 5 CC RED 3 CC   Culture  Setup Time   Final    GRAM NEGATIVE RODS IN BOTH AEROBIC AND ANAEROBIC BOTTLES CRITICAL RESULT CALLED TO, READ BACK BY AND VERIFIED WITH: L SATTERFIELD, RN AT [phone] 04/10/15 L BENFIELD    Culture   Final    KLEBSIELLA PNEUMONIAE SUSCEPTIBILITIES PERFORMED ON PREVIOUS CULTURE WITHIN THE  LAST 5 DAYS.    Report Status 04/12/2015 FINAL  Final  Blood Culture (routine x 2)     Status: None   Collection Time: 04/09/15  9:45 PM  Result Value Ref Range Status   Specimen Description BLOOD RIGHT HAND  Final   Special Requests BOTTLES DRAWN AEROBIC AND ANAEROBIC 10 CC  Final   Culture  Setup Time   Final    GRAM  NEGATIVE RODS IN BOTH AEROBIC AND ANAEROBIC BOTTLES CRITICAL RESULT CALLED TO, READ BACK BY AND VERIFIED WITH: L SATTERFIELD,RN AT [phone] 04/10/15 L BENFIELD    Culture KLEBSIELLA PNEUMONIAE  Final   Report Status 04/12/2015 FINAL  Final   Organism ID, Bacteria KLEBSIELLA PNEUMONIAE  Final      Susceptibility   Klebsiella pneumoniae - MIC*    AMPICILLIN 8 RESISTANT Resistant     CEFAZOLIN <=4 SENSITIVE Sensitive     CEFEPIME <=1 SENSITIVE Sensitive     CEFTAZIDIME <=1 SENSITIVE Sensitive     CEFTRIAXONE <=1 SENSITIVE Sensitive     CIPROFLOXACIN <=0.25 SENSITIVE Sensitive     GENTAMICIN <=1 SENSITIVE Sensitive     IMIPENEM <=0.25 SENSITIVE Sensitive     TRIMETH/SULFA <=20 SENSITIVE Sensitive     AMPICILLIN/SULBACTAM <=2 SENSITIVE Sensitive     PIP/TAZO <=4 SENSITIVE Sensitive     * KLEBSIELLA PNEUMONIAE  Urine culture     Status: None   Collection Time: 04/09/15  9:59 PM  Result Value Ref Range Status   Specimen Description URINE, CATHETERIZED  Final   Special Requests NONE  Final   Culture NO GROWTH 2 DAYS  Final   Report Status 04/11/2015 FINAL  Final  MRSA PCR Screening     Status: None   Collection Time: 04/10/15  3:11 AM  Result Value Ref Range Status   MRSA by PCR NEGATIVE NEGATIVE Final    Comment:        The GeneXpert MRSA Assay (FDA approved for NASAL specimens only), is one component of a comprehensive MRSA colonization surveillance program. It is not intended to diagnose MRSA infection nor to guide or monitor treatment for MRSA infections.   Culture, blood (routine x 2)     Status: None (Preliminary result)   Collection Time: 04/12/15  3:55  PM  Result Value Ref Range Status   Specimen Description BLOOD RIGHT ANTECUBITAL  Final   Special Requests BOTTLES DRAWN AEROBIC AND ANAEROBIC 10CC  Final   Culture PENDING  Incomplete   Report Status PENDING  Incomplete     Scheduled Meds: . cefTAZidime (FORTAZ)  IV  1 g Intravenous Q8H  . enoxaparin (LOVENOX) injection  40 mg Subcutaneous Daily  . sodium chloride  500 mL Intravenous Once   Continuous Infusions:

## 2015-04-13 NOTE — Progress Notes (Signed)
Utilization Review completed. Ashad Fawbush RN BSN CM 

## 2015-04-13 NOTE — Evaluation (Addendum)
Physical Therapy Evaluation Patient Details Name: Howard Rhodes MRN: [id number] DOB: [birth year] Today's Date: 04/13/2015   History of Present Illness  81 year old male with extensive PMH presenting with septic shock, unclear etiology, in shock, requiring levophed  Past Medical History  Diagnosis Date  . HLD (hyperlipidemia)   . Arthritis   . Anemia   . Depression   . Hip fracture   . Stroke    Past Surgical History  Procedure Laterality Date  . Hernia repair    . Femur im nail Left 03/09/2013    Procedure: INTRAMEDULLARY (IM) NAIL FEMORAL;  Surgeon: Augustin Schooling, MD;  Location: [facility];  Service: Orthopedics;  Laterality: Left;     Clinical Impression  Patient evaluated by Physical Therapy with no further acute PT needs identified. All education has been completed and the patient and family have no further questions.   Pt has been at home and bed-bound for approx 2 years; after the hip fracture, he went to SNF for rehab, then dc'd home with HHPT, and went through 2 courses of HHPT over the 2 years; Given this acute illness, and that he is requiring total assist to roll (and wife reports he "helps more" with rolling at home), he is going through a functional decline, and SNF for rehab or HHPT for rehab to decrease caregiver burden aren't unreasonable;   Pt and wife outright declined SNF; They are willing to consider HHPT for a "check up"  I took the opportunity to discuss protecting his skin by rolling and some of the benefits of mobility, and getting up and OOB (they have a hoyer lift which they haven't used for over a year); I also educated them on using PRAFOs for optimal positioning, and his wife was extremely hesitant to use them (saying, "we won't use those"); I educated his wife in body mechanics to assist him with bed mobility, toileting (they use adult diapers), and therapeutic exercises;   See below for any follow-up Physical Therapy or equipment needs. PT is signing off.  Thank you for this referral.     Follow Up Recommendations Home health PT;Supervision/Assistance - 24 hour [facility] for chronic disease management)  [place]    Equipment Recommendations  Other (comment) (if rolling remains this difficult, consider air matttress for the hospital bed) Ambulance transport home    Recommendations for Other Services       Precautions / Restrictions Precautions Precautions: Fall      Mobility  Bed Mobility Overal bed mobility: Needs Assistance Bed Mobility: Rolling Rolling: Total assist         General bed mobility comments: Cues to initiate rolling L and Right with looking to that side and reaching for the rails; slow to respond and requiring hand-over-hand assist to reach; total assist to roll right and left with bed pad  Transfers                    Ambulation/Gait                Stairs            Wheelchair Mobility    Modified Rankin (Stroke Patients Only)       Balance                                             Pertinent Vitals/Pain Pain Assessment:  Faces Faces Pain Scale: Hurts little more Pain Location: L foot, ankle with dorsiflexion range; L knee with PROM heel slides Pain Descriptors / Indicators: Grimacing Pain Intervention(s): Limited activity within patient's tolerance;Monitored during session;Repositioned    Home Living Family/patient expects to be discharged to:: Private residence Living Arrangements: Spouse/significant other Available Help at Discharge: Family;Available 24 hours/day Type of Home: House         Home Equipment: Walker - 2 wheels;Wheelchair - manual;Hospital bed (hoyer lift)      Prior Function Level of Independence: Needs assistance   Gait / Transfers Assistance Needed: non-ambulatory for approx 2 years since hip fx; does not transfer OOB to [facility] or to wheelchair  ADL's / Homemaking Assistance Needed: Total assist; wears adult diapers and wife changes  him and gives him bed baths        Hand Dominance        Extremity/Trunk Assessment   Upper Extremity Assessment: Generalized weakness (limited L Shoulder ROM into flexion or abduction)           Lower Extremity Assessment: RLE deficits/detail;LLE deficits/detail RLE Deficits / Details: Able to actively move RLE into heel slides, hip abduction, and will initiate strainght leg raise; Heavy mod asssist with these motions; Evidence of plantar flexion contractures, passivley dorsiflexed to within about 20 deg of neutral, no pain reported LLE Deficits / Details: Able to actively move RLE into heel slides, hip abduction, and will initiate strainght leg raise; Heavy max asssist with these motions; Evidence of plantar flexion contractures, passivley dorsiflexed to within about 25 deg of neutral, grimaces in pain     Communication   Communication: Other (comment) (speaks minimal phrases)  Cognition Arousal/Alertness: Awake/alert Behavior During Therapy: WFL for tasks assessed/performed Overall Cognitive Status: Impaired/Different from baseline Area of Impairment: Following commands       Following Commands: Follows one step commands inconsistently (and with increased time)       General Comments: Spoke minimally during session, but able to reliably nod yes/no; very delayed response    General Comments      Exercises General Exercises - Lower Extremity Ankle Circles/Pumps: PROM;Right;Left;5 reps Heel Slides: PROM;Left;AAROM;Right;5 reps Hip ABduction/ADduction: AAROM;Right;Left;5 reps Straight Leg Raises: AAROM;Right;Left;Other reps (comment) (2)      Assessment/Plan    PT Assessment All further PT needs can be met in the next venue of care  PT Diagnosis Generalized weakness   PT Problem List Decreased strength;Decreased range of motion;Decreased activity tolerance;Decreased balance;Decreased mobility;Decreased coordination;Decreased cognition;Decreased knowledge of use  of DME;Decreased safety awareness;Pain;Obesity  PT Treatment Interventions Functional mobility training;Therapeutic activities;Therapeutic exercise   PT Goals (Current goals can be found in the Care Plan section) Acute Rehab PT Goals Patient Stated Goal: to go home PT Goal Formulation: All assessment and education complete, DC therapy    Frequency     Barriers to discharge   Pt's wife made it clear that she would not like for him to go to post-acute rehab at SNF    Co-evaluation               End of Session Equipment Utilized During Treatment:  (bed pad) Activity Tolerance: Patient tolerated treatment well Patient left: in bed;with call bell/phone within reach;with family/visitor present Nurse Communication: Mobility status         Time: 1440-1459 PT Time Calculation (min) (ACUTE ONLY): 19 min   Charges:   PT Evaluation $Initial PT Evaluation Tier I: 1 Procedure     PT G Codes:  Roney Marion [facility] 04/13/2015, 4:29 PM  Roney Marion, [place] Pager [phone] Office [phone]

## 2015-04-14 ENCOUNTER — Inpatient Hospital Stay (HOSPITAL_COMMUNITY): Payer: Medicare Other

## 2015-04-14 LAB — CBC
HCT: 39.9 % (ref 39.0–52.0)
HEMOGLOBIN: 13.8 g/dL (ref 13.0–17.0)
MCH: 31.6 pg (ref 26.0–34.0)
MCHC: 34.6 g/dL (ref 30.0–36.0)
MCV: 91.3 fL (ref 78.0–100.0)
PLATELETS: 175 10*3/uL (ref 150–400)
RBC: 4.37 MIL/uL (ref 4.22–5.81)
RDW: 14.9 % (ref 11.5–15.5)
WBC: 13.3 10*3/uL — AB (ref 4.0–10.5)

## 2015-04-14 LAB — COMPREHENSIVE METABOLIC PANEL
ALT: 68 U/L — ABNORMAL HIGH (ref 17–63)
AST: 60 U/L — AB (ref 15–41)
Albumin: 2 g/dL — ABNORMAL LOW (ref 3.5–5.0)
Alkaline Phosphatase: 213 U/L — ABNORMAL HIGH (ref 38–126)
Anion gap: 8 (ref 5–15)
BUN: 7 mg/dL (ref 6–20)
CALCIUM: 8.3 mg/dL — AB (ref 8.9–10.3)
CO2: 25 mmol/L (ref 22–32)
Chloride: 102 mmol/L (ref 101–111)
Creatinine, Ser: 0.54 mg/dL — ABNORMAL LOW (ref 0.61–1.24)
GFR calc Af Amer: >60 mL/min (ref >60)
Glucose, Bld: 108 mg/dL — ABNORMAL HIGH (ref 65–99)
Potassium: 3.5 mmol/L (ref 3.5–5.1)
Sodium: 135 mmol/L (ref 135–145)
TOTAL PROTEIN: 5.4 g/dL — AB (ref 6.5–8.1)
Total Bilirubin: 3.5 mg/dL — ABNORMAL HIGH (ref 0.3–1.2)

## 2015-04-14 LAB — MAGNESIUM: Magnesium: 2 mg/dL (ref 1.7–2.4)

## 2015-04-14 MED ORDER — FUROSEMIDE 10 MG/ML IJ SOLN
20.0000 mg | Freq: Two times a day (BID) | INTRAMUSCULAR | Status: DC
  Administered 2015-04-14 – 2015-04-16 (×5): 20 mg via INTRAVENOUS
  Filled 2015-04-14 (×8): qty 2

## 2015-04-14 MED ORDER — CEFTRIAXONE SODIUM IN DEXTROSE 20 MG/ML IV SOLN
1.0000 g | INTRAVENOUS | Status: DC
  Administered 2015-04-14 – 2015-04-16 (×3): 1 g via INTRAVENOUS
  Filled 2015-04-14 (×4): qty 50

## 2015-04-14 NOTE — Progress Notes (Signed)
Patient ID: Howard Rhodes, male   DOB: [birth year], 81 y.o.   MRN: [id number]  [facility] PROGRESS NOTE  Darryl Willner JXB:[id number] DOB: 1931-03-12 DOA: 04/09/2015 PCP: Ailene Ravel, MD   Brief narrative:    81 year old male with past medical history of arthritis, anemia, HLD, presented to [facility] ED for evaluation of confusion, lethargy, poor oral intake 2-3 days in duration.   In ED, pt noted to be altered, VS notable for SBP in 80's with T 103 F, blood work notable for lactic acid 5.9, WBC 22K, blood cultures with g-rods of unclear etiology. Initially admitted under PCCM care requiring pressor support and transferred to [facility] 6/18 for continuation of care.   Assessment/Plan:    Active Problems: Acute encephalopathy - determined to be secondary to sepsis - Patient is more alert this morning, follows most of the commands appropriately, able to answer simple questions - continue treating sepsis  - PT/OT requested and recommended home health PT, supervision  Sepsis secondary to gram-negative bacteremia/Klebsiella - source is unclear but per CXR, PNA in the left lung is possible source - Elita Quick will be transitioned to Rocephin based on the sensitivity report - sepsis etiology seems to be resolving slowly  - WBC is trending down from 21.9 K to 12.4 K but slightly up this morning to 13 K, low-grade fever and T 99.64F - repeat blood cultures form 6/18 with no growth to date - Repeat CBC in the morning  Lower extremity edema - Likely from volume overload in the setting of sepsis and requirement for IV fluids in the setting of hypoalbuminemia - IV fluids have been discontinued 04/13/2015 - Weight is up from 190 lbs to 207 lbs - Placed on Lasix 20 mg IV twice a day, reassess clinical response in the morning - Daily weights to be checked  Transaminitis with hyperbilirubinemia - Could be due to sepsis. Abdomen is benign and abd [place] with hepatic steatosis, some sludge but no evidence of  cholecystitis  - statin temporarily on hold  - LFTs trending down, will repeat again in the morning  Hypomagnesemia with hypokalemia - both electrolytes supplemented and WNL  - repeat BMP and Mg in AM  Acute respiratory failure, left lower lobe pneumonia - with wheezing 6/18  - Fortaz transition to Rocephin based on the sensitivity reports of blood culture - continue BD's as needed  - Repeat chest x-ray to ensure resolving pneumonia  Moderate PCM - in the context of acute illness - nutritionist consulted  - SLP done, thin diet recommended   Obesity - Body mass index is 35.63 kg/(m^2).  DVT prophylaxis - Lovenox SQ  Code Status: Full.  Family Communication:  Wife updated over the phone Disposition Plan: Not ready for D/C due to IV ABX requirement, also needs IV Lasix for volume overload  IV access:  Peripheral IV  Procedures and diagnostic studies:    Dg Chest 2 View 04/12/2015  Decreased aeration since previously with increased left lower lobe/ retrocardiac consolidation which could represent atelectasis although pneumonia could appear similar.     Ct Head Wo Contrast 04/12/2015  Chronic findings as above without acute intra cranial abnormality.     US Abdomen Complete 04/13/2015    Hepatic increased echogenicity likely indicates underlying steatosis without focal abnormality or intrahepatic ductal dilatation.  Gallbladder sludge without other sonographic evidence for acute cholecystitis.     Dg Chest Portable 1 View 04/10/2015  Central venous catheter tip localizes over the expected location of the junction between the  left brachiocephalic vein and SVC  Dg Chest Port 1 View 04/09/2015  Low inspiratory volumes with chronic left basilar atelectasis and/ or scarring.   Medical Consultants:  None  Other Consultants:  PT OT  IAnti-Infectives:   Elita Quick 6/16 --> changed to Rocephin 04/14/2015 --> Vanc and Rocephin x 1 dose given on admission 6/15   Debbora Presto,  MD  [facility] Pager [phone]  If 7PM-7AM, please contact night-coverage [email or web address] Password [email or web address] 04/14/2015, 10:21 AM   LOS: 5 days   HPI/Subjective: No events overnight.   Objective: Filed Vitals:   04/13/15 0529 04/13/15 1304 04/13/15 2256 04/14/15 0526  BP: 109/61 118/65 125/68 112/62  Pulse: 91 91 85 86  Temp: 99.3 F (37.4 C) 98.6 F (37 C) 99.6 F (37.6 C) 99.2 F (37.3 C)  TempSrc: Oral Oral Oral Oral  Resp: 18  20 18   Height:      Weight:      SpO2: 95% 96% 95% 95%    Intake/Output Summary (Last 24 hours) at 04/14/15 1021 Last data filed at 04/14/15 0835  Gross per 24 hour  Intake    600 ml  Output      0 ml  Net    600 ml    Exam:   General:  Pt is alert, follows commands appropriately, not in acute distress, answers simple questions  Cardiovascular: Regular rate and rhythm, S1/S2, no murmurs, no rubs, no gallops  Respiratory: Clear to auscultation bilaterally, no wheezing, diminished breath sounds at bases   Abdomen: Soft, non tender, non distended, bowel sounds present, no guarding  Extremities: +2 bilateral pedal and ankle edema, pulses palpable  Data Reviewed: Basic Metabolic Panel:  Recent Labs Lab 04/10/15 0515 04/11/15 0355 04/11/15 1940 04/12/15 0515 04/12/15 1047 04/13/15 0347 04/14/15 0740  NA 143 139 135 134*  --  133* 135  K 3.1* 2.6* 4.3 3.5  --  3.6 3.5  CL 110 108 105 103  --  102 102  CO2 16* 23 21* 22  --  25 25  GLUCOSE 116* 116* 172* 110*  --  102* 108*  BUN 9 8 10 9   --  <5* 7  CREATININE 0.87 0.64 0.51* 0.51*  --  0.56* 0.54*  CALCIUM 7.6* 7.9* 7.9* 7.8*  --  7.9* 8.3*  MG 1.6* 1.6*  --   --  2.0  --  2.0  PHOS 4.0 1.7*  --   --   --   --   --    Liver Function Tests:  Recent Labs Lab 04/12/15 1047 04/13/15 0347 04/14/15 0740  AST 98* 84* 60*  ALT 92* 81* 68*  ALKPHOS 230* 224* 213*  BILITOT 4.4* 3.7* 3.5*  PROT 5.2* 5.1* 5.4*  ALBUMIN 2.1* 2.0* 2.0*    Recent Labs Lab 04/10/15 0515 04/13/15 0347   AMMONIA 43* 41*   CBC:  Recent Labs Lab 04/09/15 2133 04/10/15 0515 04/12/15 0515 04/13/15 0347 04/14/15 0740  WBC 21.9* 19.2* 12.1* 12.4* 13.3*  NEUTROABS 20.8*  --   --   --   --   HGB 19.2* 15.5 13.6 13.7 13.8  HCT 54.7* 45.7 38.9* 38.9* 39.9  MCV 95.3 96.0 92.0 90.0 91.3  PLT 229 177 156 167 175   Cardiac Enzymes:  Recent Labs Lab 04/10/15 0520  TROPONINI 0.06*   CBG:  Recent Labs Lab 04/10/15 0409  GLUCAP 113*    Recent Results (from the past 240 hour(s))  Blood Culture (routine x 2)     Status:  None   Collection Time: 04/09/15  9:33 PM  Result Value Ref Range Status   Specimen Description BLOOD RIGHT   Final   Special Requests   Final    BOTTLES DRAWN AEROBIC AND ANAEROBIC BLUE 5 CC RED 3 CC   Culture  Setup Time   Final    GRAM NEGATIVE RODS   Culture   Final    KLEBSIELLA PNEUMONIAE   Report Status 04/12/2015 FINAL  Final  Blood Culture (routine x 2)     Status: None   Collection Time: 04/09/15  9:45 PM  Result Value Ref Range Status   Specimen Description BLOOD RIGHT   Final   Special Requests BOTTLES DRAWN  Final    GRAM NEGATIVE RODS   Report Status 04/12/2015 FINAL  Final   Organism ID, Bacteria KLEBSIELLA PNEUMONIAE  Final      Susceptibility   Klebsiella pneumoniae - MIC*    AMPICILLIN 8 RESISTANT Resistant     CEFAZOLIN <=4 SENSITIVE Sensitive     CEFEPIME <=1 SENSITIVE Sensitive     CEFTAZIDIME <=1 SENSITIVE Sensitive     CEFTRIAXONE <=1 SENSITIVE Sensitive     CIPROFLOXACIN <=0.25 SENSITIVE Sensitive     GENTAMICIN <=1 SENSITIVE Sensitive     IMIPENEM <=0.25 SENSITIVE Sensitive     TRIMETH/SULFA <=20 SENSITIVE Sensitive     AMPICILLIN/SULBACTAM <=2 SENSITIVE Sensitive     PIP/TAZO <=4 SENSITIVE Sensitive     * KLEBSIELLA PNEUMONIAE  Urine culture     Status: None   Collection Time: 04/09/15  9:59 PM  Result Value Ref Range Status   Specimen Description URINE, CATHETERIZED  Final   Special Requests NONE  Final   Culture NO  GROWTH 2 DAYS  Final   Report Status 04/11/2015 FINAL  Final  MRSA PCR Screening     Status: None   Collection Time: 04/10/15  3:11 AM  Result Value Ref Range Status   MRSA by PCR NEGATIVE NEGATIVE Final  Culture, blood (routine x 2)     Status: None (Preliminary result)   Collection Time: 04/12/15  3:50 PM  Result Value Ref Range Status   Specimen Description BLOOD LFHAND  Final   Special Requests IN PEDIATRIC BOTTLE 1.5CC  Final   Culture NO GROWTH < 24 HOURS  Final   Report Status PENDING  Incomplete  Culture, blood (routine x 2)     Status: None (Preliminary result)   Collection Time: 04/12/15  3:55 PM  Result Value Ref Range Status   Specimen Description BLOOD RIGHT ANTECUBITAL  Final   Culture NO GROWTH < 24 HOURS  Final   Report Status PENDING  Incomplete     Scheduled Meds: . cefTRIAXone (ROCEPHIN)  IV  1 g Intravenous Q24H  . enoxaparin (LOVENOX) injection  40 mg Subcutaneous Daily  . sodium chloride  500 mL Intravenous Once   Continuous Infusions:

## 2015-04-14 NOTE — Evaluation (Signed)
Occupational Therapy Evaluation Patient Details Name: Howard Rhodes MRN: [id number] DOB: [birth year] Today's Date: 04/14/2015    History of Present Illness 81 year old male with extensive PMH presenting with septic shock, unclear etiology, in shock, requiring levophed   Clinical Impression   Pt is at total A baseline level of function. Pt non ambulatory, mostly bed bound at home. Pt's wif eprovded total care at home with DME     Follow Up Recommendations  Other (comment) (HH aide)    Equipment Recommendations  None recommended by OT    Recommendations for Other Services       Precautions / Restrictions Precautions Precautions: Fall Restrictions Weight Bearing Restrictions: No      Mobility Bed Mobility Overal bed mobility: Needs Assistance Bed Mobility: Rolling Rolling: Max assist            Transfers                      Balance  N/T                                          ADL Overall ADL's : At baseline;Needs assistance/impaired (total A, even with self feeding)                                             Vision  no change from baseline per pt report   Perception Perception Perception Tested?: No   Praxis Praxis Praxis tested?: Not tested    Pertinent Vitals/Pain Pain Assessment: No/denies pain Pain Intervention(s): Monitored during session;Repositioned     Hand Dominance Right   Extremity/Trunk Assessment Upper Extremity Assessment Upper Extremity Assessment: Generalized weakness   Lower Extremity Assessment Lower Extremity Assessment: Defer to PT evaluation       Communication Communication Communication: No difficulties   Cognition Arousal/Alertness: Awake/alert Behavior During Therapy: WFL for tasks assessed/performed Overall Cognitive Status: Impaired/Different from baseline Area of Impairment: Following commands       Following Commands: Follows one step commands  inconsistently;Follows one step commands with increased time           General Comments   pt pleasant and cooperative                 Home Living Family/patient expects to be discharged to:: Private residence Living Arrangements: Spouse/significant other Available Help at Discharge: Family;Available 24 hours/day Type of Home: House Home Access: Stairs to enter [employer] of Steps: 5 Entrance Stairs-Rails: Right;Left Home Layout: One level     Bathroom Shower/Tub: Tub/shower unit;Walk-in shower   Bathroom Toilet: Standard     Home Equipment: Environmental consultant - 2 wheels;Wheelchair - manual;Hospital bed          Prior Functioning/Environment Level of Independence: Needs assistance  Gait / Transfers Assistance Needed: non-ambulatory for approx 2 years since hip fx; does not transfer OOB to [facility] or to wheelchair ADL's / Homemaking Assistance Needed: Total assist; wears adult diapers and wife changes him and gives him bed baths        OT Diagnosis: Generalized weakness;Cognitive deficits   OT Problem List: Decreased activity tolerance;Decreased cognition   OT Treatment/Interventions:      OT Goals(Current goals can be found in the care plan section) Acute Rehab OT Goals Patient  Stated Goal: to go home OT Goal Formulation: With patient  OT Frequency:     Barriers to D/C:                          End of Session    Activity Tolerance: Patient tolerated treatment well Patient left: in bed;with nursing/sitter in room with call bell in reach   Time: 1020-1038 OT Time Calculation (min): 18 min Charges:  OT General Charges $OT Visit: 1 Procedure OT Evaluation $Initial OT Evaluation Tier I: 1 Procedure G-Codes:    Galen Manila 04/14/2015, 1:58 PM

## 2015-04-14 NOTE — Progress Notes (Addendum)
Speech Language Pathology Treatment: Dysphagia  Patient Details Name: Howard Rhodes MRN: [id number] DOB: 07-09-1931 Today's Date: 04/14/2015 Time: 8466-5993 SLP Time Calculation (min) (ACUTE ONLY): 15 min  Assessment / Plan / Recommendation Clinical Impression  Dysphagia treatment provided today for diet tolerance check. Pt demonstrated immediate cough following 3 out of 3 trials of regular solids. Pt reported choking incidents with dry solids. No s/s of aspiration noted with thin liquids prior to solid food trials. Given these findings along with persistent LLL atelectasis or infiltrate, recommend downgrading diet to dysphagia 3/ thin liquids, meds whole with liquid, provide intermittent supervision to cue pt to take small bites/ sips. SLP will f/u at least x1 to ensure diet tolerance and consideration of objective evaluation if difficulties persist.   HPI Other Pertinent Information: 81 year old Caucasian male with past medical history of arthritis, anemia, hyperlipidemia, presented to the emergency department with altered mental status. History was provided by his wife. . In the emergency department he was noted to be hypotensive with blood pressures in the 80s. temperature was 103. Chest x ray showerd increased left lower lobe/ retrocardiac consolidation which could represent atelectasis   Pertinent Vitals Pain Assessment: No/denies pain Pain Intervention(s): Monitored during session;Repositioned  SLP Plan  Continue with current plan of care    Recommendations Diet recommendations: Dysphagia 3 (mechanical soft);Thin liquid Liquids provided via: Straw Medication Administration: Whole meds with liquid Supervision: Patient able to self feed;Intermittent supervision to cue for compensatory strategies Compensations: Slow rate;Small sips/bites Postural Changes and/or Swallow Maneuvers: Seated upright 90 degrees              Oral Care Recommendations: Oral care BID Follow up Recommendations:  None Plan: Continue with current plan of care    GO     Metro Kung, MA, CCC-SLP 04/14/2015, 3:23 PM  [phone]

## 2015-04-14 NOTE — Progress Notes (Signed)
Nutrition Brief Note  RD consulted to assess nutrition needs and status.   Wt Readings from Last 15 Encounters:  04/14/15 196 lb 10.4 oz (89.2 kg)  04/16/13 195 lb 5.2 oz (88.6 kg)  03/08/13 220 lb (99.791 kg)   81 y.o. M brought to [facility] ED 6/15 with AMS and hypotension. In ED, remained hypotensive after 4L IVF. PCCM called for admission of septic shock.  Hx obtained by pt at bedside. Pt is not very talkative, but will response appropriately to closed ended questions.   Pt reports that his appetite is always good; he confirmed that he consumed 100% of his breakfast and is awaiting his lunch order. He denies any difficulty chewing or swallowing foods.   He denies weight loss; noted wt has been stable over the past year. He denies any recent changes in his activity level.   Nutrition-Focused physical exam completed. Findings are no fat depletion, no muscle depletion, and no edema.   Pt denies any further nutritional needs. Educated pt on importance of continued good meal intake to support healing.   Body mass index is 33.74 kg/(m^2). Patient meets criteria for obesity, class I based on current BMI.   Current diet order is Heart Healthy, patient is consuming approximately 100% of meals at this time. Labs and medications reviewed.   No nutrition interventions warranted at this time. If nutrition issues arise, please consult RD.   Yordan Martindale A. Mayford Knife, RD, LDN, CDE Pager: [phone] After hours Pager: [phone]

## 2015-04-15 ENCOUNTER — Inpatient Hospital Stay (HOSPITAL_COMMUNITY): Payer: Medicare Other

## 2015-04-15 DIAGNOSIS — E872 Acidosis: Secondary | ICD-10-CM

## 2015-04-15 LAB — CBC
HEMATOCRIT: 38.9 % — AB (ref 39.0–52.0)
Hemoglobin: 13.6 g/dL (ref 13.0–17.0)
MCH: 32.2 pg (ref 26.0–34.0)
MCHC: 35 g/dL (ref 30.0–36.0)
MCV: 92 fL (ref 78.0–100.0)
PLATELETS: 224 10*3/uL (ref 150–400)
RBC: 4.23 MIL/uL (ref 4.22–5.81)
RDW: 14.8 % (ref 11.5–15.5)
WBC: 14.9 10*3/uL — ABNORMAL HIGH (ref 4.0–10.5)

## 2015-04-15 LAB — URINALYSIS, ROUTINE W REFLEX MICROSCOPIC
Bilirubin Urine: NEGATIVE
GLUCOSE, UA: NEGATIVE mg/dL
HGB URINE DIPSTICK: NEGATIVE
KETONES UR: NEGATIVE mg/dL
Leukocytes, UA: NEGATIVE
Nitrite: NEGATIVE
PH: 6 (ref 5.0–8.0)
Protein, ur: NEGATIVE mg/dL
Specific Gravity, Urine: 1.01 (ref 1.005–1.030)
Urobilinogen, UA: 1 mg/dL (ref 0.0–1.0)

## 2015-04-15 LAB — BASIC METABOLIC PANEL
Anion gap: 9 (ref 5–15)
BUN: 10 mg/dL (ref 6–20)
CO2: 26 mmol/L (ref 22–32)
Calcium: 8.2 mg/dL — ABNORMAL LOW (ref 8.9–10.3)
Chloride: 100 mmol/L — ABNORMAL LOW (ref 101–111)
Creatinine, Ser: 0.58 mg/dL — ABNORMAL LOW (ref 0.61–1.24)
Glucose, Bld: 108 mg/dL — ABNORMAL HIGH (ref 65–99)
POTASSIUM: 3.5 mmol/L (ref 3.5–5.1)
Sodium: 135 mmol/L (ref 135–145)

## 2015-04-15 LAB — PROCALCITONIN: Procalcitonin: 0.81 ng/mL

## 2015-04-15 LAB — LACTIC ACID, PLASMA
LACTIC ACID, VENOUS: 2.6 mmol/L — AB (ref 0.5–2.0)
Lactic Acid, Venous: 3.5 mmol/L (ref 0.5–2.0)

## 2015-04-15 MED ORDER — SODIUM CHLORIDE 0.9 % IV BOLUS (SEPSIS)
1000.0000 mL | Freq: Once | INTRAVENOUS | Status: AC
  Administered 2015-04-15: 1000 mL via INTRAVENOUS

## 2015-04-15 MED ORDER — SODIUM CHLORIDE 0.9 % IV BOLUS (SEPSIS): 1000.0000 mL | Freq: Once | INTRAVENOUS | Status: DC

## 2015-04-15 NOTE — Progress Notes (Signed)
CRITICAL VALUE ALERT  Critical value received:  Lactic 3.5  Date of notification:  04/15/2015  Time of notification:  2049  Critical value read back:Yes.    Nurse who received alert:  Marlon Pel  MD notified (1st page):  NP Schorr  Time of first page:  2050  MD notified (2nd page):  Time of second page:  Responding MD:    Time MD responded:  NP has not responded at of yet

## 2015-04-15 NOTE — Progress Notes (Signed)
Speech Language Pathology Treatment: Dysphagia  Patient Details Name: Howard Rhodes MRN: [id number] DOB: 10-15-1931 Today's Date: 04/15/2015 Time: 1202-1216 SLP Time Calculation (min) (ACUTE ONLY): 14 min  Assessment / Plan / Recommendation Clinical Impression  Dysphagia treatment provided today for diet tolerance check and pt education/ review of compensatory strategies. Pt demonstrated immediate cough x1 following bite of dysphagia 3 consistency- only 1 instance today out of 6 trials. Gave pt smaller bite and cued pt to chew thoroughly. No s/s of aspiration with thin liquids- pt did require multiple verbal cues to take small sips at a time. Spoke with NT who reported no difficulties during meal this morning. Recommend continuing dysphagia 3 diet, thin liquids, intermittent supervision to assist with feeding and cue for compensatory strategies. SLP will sign off at this time. Please re-consult if difficulties or concerns arise.   HPI Other Pertinent Information: 81 year old Caucasian male with past medical history of arthritis, anemia, hyperlipidemia, presented to the emergency department with altered mental status. History was provided by his wife. . In the emergency department he was noted to be hypotensive with blood pressures in the 80s. temperature was 103. Chest x ray showerd increased left lower lobe/ retrocardiac consolidation which could represent atelectasis   Pertinent Vitals Pain Assessment: No/denies pain  SLP Plan  All goals met    Recommendations Diet recommendations: Dysphagia 3 (mechanical soft);Thin liquid Liquids provided via: Straw Medication Administration: Whole meds with liquid Supervision: Patient able to self feed;Intermittent supervision to cue for compensatory strategies Compensations: Slow rate;Small sips/bites Postural Changes and/or Swallow Maneuvers: Seated upright 90 degrees              Oral Care Recommendations: Oral care BID Follow up Recommendations:  None Plan: All goals met    GO     Kern Reap, MA, CCC-SLP 04/15/2015, 12:19 PM  Page: [phone]

## 2015-04-15 NOTE — Progress Notes (Signed)
CRITICAL VALUE ALERT  Critical value received:  Lactic acid 2.6  Date of notification:  04/15/2015  Time of notification:  6:21 PM  Critical value read back:Yes.    Nurse who received alert:  Billee Cashing   MD notified (1st page):  Dr. Izola Price  Time of first page:  6:21 PM  MD notified (2nd page):  Time of second page:  Responding MD:   Dr. Izola Price

## 2015-04-15 NOTE — Progress Notes (Signed)
Patient ID: Howard Rhodes, male   DOB: [birth year], 81 y.o.   MRN: [id number]  [facility] PROGRESS NOTE  Yoandri Congrove WUJ:[id number] DOB: 06/28/1931 DOA: 04/09/2015 PCP: Ailene Ravel, MD   Brief narrative:    81 year old male with past medical history of arthritis, anemia, HLD, presented to [facility] ED for evaluation of confusion, lethargy, poor oral intake 2-3 days in duration.   In ED, pt noted to be altered, VS notable for SBP in 80's with T 103 F, blood work notable for lactic acid 5.9, WBC 22K, blood cultures with g-rods of unclear etiology. Initially admitted under PCCM care requiring pressor support and transferred to [facility] 6/18 for continuation of care.   Assessment/Plan:    Active Problems: Acute encephalopathy - determined to be secondary to sepsis - Patient is more alert this morning, follows most of the commands appropriately, able to answer simple questions - per family, still not at baseline mental status  - continue treating sepsis  - PT/OT requested, pt will needs SNF upon discharge   Sepsis secondary to gram-negative bacteremia/Klebsiella - source is unclear but per CXR, PNA in the left lung is possible source - Elita Quick transitioned to Rocephin based on the sensitivity report - today is day #5 of ABX and pt with low grade fever 100 F and WBC trending up - will ask for UA, urine culture, lactic acid, CT chest to rule out underlying and developing new infectious source  - repeat blood cultures form 6/18 with no growth to date - Repeat CBC in the morning  Lower extremity edema - Likely from volume overload in the setting of sepsis and requirement for IV fluids in the setting of hypoalbuminemia - IV fluids have been discontinued 04/13/2015 - Weight is up from 190 lbs to 207 lbs - Placed on Lasix 20 mg IV twice a day, weight is down to 197 lbs this AM - Daily weights will be monitored - continue Lasix as pt still with significant LE swelling   Transaminitis with  hyperbilirubinemia - Could be due to sepsis. Abdomen is benign and abd [place] with hepatic steatosis, some sludge but no evidence of cholecystitis  - statin still on hold  - LFTs trending down, will repeat again in the morning  Hypomagnesemia with hypokalemia - both electrolytes supplemented and WNL  - repeat BMP and Mg in AM  Acute respiratory failure, left lower lobe pneumonia - with wheezing 6/18  - Fortaz transition to Rocephin based on the sensitivity reports of blood culture - continue BD's as needed  - CT chest requested as pt still spiking fevers Tmax 100 F  Moderate PCM - in the context of acute illness - nutritionist consulted  - SLP done, thin diet recommended   Obesity - Body mass index is 33.71 kg/(m^2).  DVT prophylaxis - Lovenox SQ  Code Status: Full.  Family Communication:  Wife updated over the phone Disposition Plan: Not ready for D/C due to IV ABX requirement, also needs IV Lasix for volume overload  IV access:  Peripheral IV  Procedures and diagnostic studies:    Dg Chest 2 View 04/12/2015  Decreased aeration since previously with increased left lower lobe/ retrocardiac consolidation which could represent atelectasis although pneumonia could appear similar.     Ct Head Wo Contrast 04/12/2015  Chronic findings as above without acute intra cranial abnormality.     US Abdomen Complete 04/13/2015    Hepatic increased echogenicity likely indicates underlying steatosis without focal abnormality or intrahepatic ductal dilatation.  Gallbladder  sludge without other sonographic evidence for acute cholecystitis.     Dg Chest Portable 1 View 04/10/2015  Central venous catheter tip localizes over the expected location of the junction between the left brachiocephalic vein and SVC  Dg Chest Port 1 View 04/09/2015  Low inspiratory volumes with chronic left basilar atelectasis and/ or scarring.   Medical Consultants:  None  Other Consultants:  PT OT  IAnti-Infectives:    Elita Quick 6/16 --> changed to Rocephin 04/14/2015 --> Vanc and Rocephin x 1 dose given on admission 6/15   Debbora Presto, MD  [facility] Pager [phone]  If 7PM-7AM, please contact night-coverage [email or web address] Password [facility] 04/15/2015, 2:59 PM   LOS: 6 days   HPI/Subjective: No events overnight.   Objective: Filed Vitals:   04/14/15 2151 04/15/15 0418 04/15/15 0514 04/15/15 1339  BP: 124/76  108/63 114/67  Pulse: 119  88 88  Temp: 99.3 F (37.4 C)  98.6 F (37 C) 100 F (37.8 C)  TempSrc: Oral  Oral Oral  Resp: 17  22 24   Height:      Weight:  89.132 kg (196 lb 8 oz)    SpO2: 96%  91% 95%    Intake/Output Summary (Last 24 hours) at 04/15/15 1459 Last data filed at 04/15/15 1342  Gross per 24 hour  Intake    410 ml  Output    900 ml  Net   -490 ml    Exam:   General:  Pt is alert, follows commands appropriately, not in acute distress, answers simple questions  Cardiovascular: Regular rhythm, tachycardic, S1/S2, no murmurs, no rubs, no gallops  Respiratory: Clear to auscultation bilaterally, rhonchi at bases   Abdomen: Soft, non tender, non distended, bowel sounds present, no guarding  Extremities: +2 bilateral pedal and ankle edema, pulses palpable  Data Reviewed: Basic Metabolic Panel:  Recent Labs Lab 04/10/15 0515 04/11/15 0355 04/11/15 1940 04/12/15 0515 04/12/15 1047 04/13/15 0347 04/14/15 0740 04/15/15 0705  NA 143 139 135 134*  --  133* 135 135  K 3.1* 2.6* 4.3 3.5  --  3.6 3.5 3.5  CL 110 108 105 103  --  102 102 100*  CO2 16* 23 21* 22  --  25 25 26   GLUCOSE 116* 116* 172* 110*  --  102* 108* 108*  BUN 9 8 10 9   --  <5* 7 10  CREATININE 0.87 0.64 0.51* 0.51*  --  0.56* 0.54* 0.58*  CALCIUM 7.6* 7.9* 7.9* 7.8*  --  7.9* 8.3* 8.2*  MG 1.6* 1.6*  --   --  2.0  --  2.0  --   PHOS 4.0 1.7*  --   --   --   --   --   --    Liver Function Tests:  Recent Labs Lab 04/12/15 1047 04/13/15 0347 04/14/15 0740  AST 98* 84* 60*  ALT 92* 81*  68*  ALKPHOS 230* 224* 213*  BILITOT 4.4* 3.7* 3.5*  PROT 5.2* 5.1* 5.4*  ALBUMIN 2.1* 2.0* 2.0*    Recent Labs Lab 04/10/15 0515 04/13/15 0347  AMMONIA 43* 41*   CBC:  Recent Labs Lab 04/09/15 2133 04/10/15 0515 04/12/15 0515 04/13/15 0347 04/14/15 0740 04/15/15 0705  WBC 21.9* 19.2* 12.1* 12.4* 13.3* 14.9*  NEUTROABS 20.8*  --   --   --   --   --   HGB 19.2* 15.5 13.6 13.7 13.8 13.6  HCT 54.7* 45.7 38.9* 38.9* 39.9 38.9*  MCV 95.3 96.0 92.0 90.0 91.3 92.0  PLT 229 177 156 167 175 224   Cardiac Enzymes:  Recent Labs Lab 04/10/15 0520  TROPONINI 0.06*   CBG:  Recent Labs Lab 04/10/15 0409  GLUCAP 113*    Recent Results (from the past 240 hour(s))  Blood Culture (routine x 2)     Status: None   Collection Time: 04/09/15  9:33 PM  Result Value Ref Range Status   Specimen Description BLOOD RIGHT   Final   Special Requests   Final    BOTTLES DRAWN AEROBIC AND ANAEROBIC BLUE 5 CC RED 3 CC   Culture  Setup Time   Final    GRAM NEGATIVE RODS   Culture   Final    KLEBSIELLA PNEUMONIAE   Report Status 04/12/2015 FINAL  Final  Blood Culture (routine x 2)     Status: None   Collection Time: 04/09/15  9:45 PM  Result Value Ref Range Status   Specimen Description BLOOD RIGHT   Final   Special Requests BOTTLES DRAWN  Final    GRAM NEGATIVE RODS   Report Status 04/12/2015 FINAL  Final   Organism ID, Bacteria KLEBSIELLA PNEUMONIAE  Final      Susceptibility   Klebsiella pneumoniae - MIC*    AMPICILLIN 8 RESISTANT Resistant     CEFAZOLIN <=4 SENSITIVE Sensitive     CEFEPIME <=1 SENSITIVE Sensitive     CEFTAZIDIME <=1 SENSITIVE Sensitive     CEFTRIAXONE <=1 SENSITIVE Sensitive     CIPROFLOXACIN <=0.25 SENSITIVE Sensitive     GENTAMICIN <=1 SENSITIVE Sensitive     IMIPENEM <=0.25 SENSITIVE Sensitive     TRIMETH/SULFA <=20 SENSITIVE Sensitive     AMPICILLIN/SULBACTAM <=2 SENSITIVE Sensitive     PIP/TAZO <=4 SENSITIVE Sensitive     * KLEBSIELLA PNEUMONIAE   Urine culture     Status: None   Collection Time: 04/09/15  9:59 PM  Result Value Ref Range Status   Specimen Description URINE, CATHETERIZED  Final   Special Requests NONE  Final   Culture NO GROWTH 2 DAYS  Final   Report Status 04/11/2015 FINAL  Final  MRSA PCR Screening     Status: None   Collection Time: 04/10/15  3:11 AM  Result Value Ref Range Status   MRSA by PCR NEGATIVE NEGATIVE Final  Culture, blood (routine x 2)     Status: None (Preliminary result)   Collection Time: 04/12/15  3:50 PM  Result Value Ref Range Status   Specimen Description BLOOD LFHAND  Final   Special Requests IN PEDIATRIC BOTTLE 1.5CC  Final   Culture NO GROWTH < 24 HOURS  Final   Report Status PENDING  Incomplete  Culture, blood (routine x 2)     Status: None (Preliminary result)   Collection Time: 04/12/15  3:55 PM  Result Value Ref Range Status   Specimen Description BLOOD RIGHT ANTECUBITAL  Final   Culture NO GROWTH < 24 HOURS  Final   Report Status PENDING  Incomplete     Scheduled Meds: . cefTRIAXone (ROCEPHIN)  IV  1 g Intravenous Q24H  . enoxaparin (LOVENOX) injection  40 mg Subcutaneous Daily  . furosemide  20 mg Intravenous BID  . sodium chloride  500 mL Intravenous Once   Continuous Infusions:

## 2015-04-15 NOTE — Clinical Social Work Note (Signed)
Clinical Social Work Assessment  Patient Details  Name: Howard Rhodes MRN: [id number] Date of Birth: 12-06-1930  Date of referral:  04/15/15               Reason for consult:  Discharge Planning, Facility Placement                Permission sought to share information with:  Family Supports, Oceanographer granted to share information::  Yes, Verbal Permission Granted  Name::        Agency::     Relationship::     Contact Information:     Housing/Transportation Living arrangements for the past 2 months:  Single Family Home Source of Information:  Other (Comment Required) (Daughter in Scientific laboratory technician) Patient Interpreter Needed:  None Criminal Activity/Legal Involvement Pertinent to Current Situation/Hospitalization:  No - Comment as needed Significant Relationships:  Adult Children, Other(Comment) (Daughter in Scientific laboratory technician) Lives with:  Spouse Do you feel safe going back to the place where you live?  Yes Need for family participation in patient care:  Yes (Comment)  Care giving concerns:  Patient's DIL Gwen believes the patient would benefit from at least short term SNF placement due to the wife's inability to provide consistent and adequate care at home. The patient's wife told PT she did not want the patient going to SNF.   Social Worker assessment / plan:  CSW spoke with the patient's daughter in law [place] by phone to complete assessment. Gwen states that the patient resides at home with the wife [place]. Per Vicki Mallet has her own moments of confusion and sometimes doesn't understand what doctors are telling her or what is recommended for the patient. Gwen supports the transition to SNF at least in the short term. She states that she will discuss this with the wife and asks that the treatment team reiterate that this is the recommendation for the patient's disposition. Gwen states that the wife is very supportive but she herself has started showing a decline in her  abilities. CSW explained SNF search/placement process and answered Gwen's questions. She states that placement close to home will be important if the wife decides to DC the patient to SNF.   Employment status:  Disabled (Comment on whether or not currently receiving Disability) Insurance information:  Managed Medicare PT Recommendations:  Skilled Nursing Facility Information / Referral to community resources:  Skilled Nursing Facility  Patient/Family's Response to care:  Patient's family does not report any concerns about the care the patient has received.  Patient/Family's Understanding of and Emotional Response to Diagnosis, Current Treatment, and Prognosis:  Patient's family has a good understanding of reason for admission but there is some divide between the family on where the patient will go at DC. CSW will continue to work with DIL and wife to determine best option for patient and family.   Emotional Assessment Appearance:  Appears stated age Attitude/Demeanor/Rapport:  Unable to Assess (Patient is confused.) Affect (typically observed):  Unable to Assess (Patient is confused at this time and is not really engaging with CSW.) Orientation:  Oriented to Self Alcohol / Substance use:  Tobacco Use (Hx of tobacco) Psych involvement (Current and /or in the community):  No (Comment)  Discharge Needs  Concerns to be addressed:  Discharge Planning Concerns Readmission within the last 30 days:  No Current discharge risk:  Chronically ill, Cognitively Impaired Barriers to Discharge:  Continued Medical Work up   [employer] MSW, [place], [place], [phone]

## 2015-04-16 LAB — COMPREHENSIVE METABOLIC PANEL
ALT: 63 U/L (ref 17–63)
AST: 70 U/L — ABNORMAL HIGH (ref 15–41)
Albumin: 2.2 g/dL — ABNORMAL LOW (ref 3.5–5.0)
Alkaline Phosphatase: 203 U/L — ABNORMAL HIGH (ref 38–126)
Anion gap: 9 (ref 5–15)
BUN: 8 mg/dL (ref 6–20)
CO2: 27 mmol/L (ref 22–32)
CREATININE: 0.66 mg/dL (ref 0.61–1.24)
Calcium: 8.2 mg/dL — ABNORMAL LOW (ref 8.9–10.3)
Chloride: 100 mmol/L — ABNORMAL LOW (ref 101–111)
GFR calc non Af Amer: >60 mL/min (ref >60)
Glucose, Bld: 109 mg/dL — ABNORMAL HIGH (ref 65–99)
Potassium: 3.5 mmol/L (ref 3.5–5.1)
Sodium: 136 mmol/L (ref 135–145)
Total Bilirubin: 1.8 mg/dL — ABNORMAL HIGH (ref 0.3–1.2)
Total Protein: 5.6 g/dL — ABNORMAL LOW (ref 6.5–8.1)

## 2015-04-16 LAB — CBC
HCT: 37.2 % — ABNORMAL LOW (ref 39.0–52.0)
Hemoglobin: 12.9 g/dL — ABNORMAL LOW (ref 13.0–17.0)
MCH: 32 pg (ref 26.0–34.0)
MCHC: 34.7 g/dL (ref 30.0–36.0)
MCV: 92.3 fL (ref 78.0–100.0)
Platelets: 228 10*3/uL (ref 150–400)
RBC: 4.03 MIL/uL — AB (ref 4.22–5.81)
RDW: 14.8 % (ref 11.5–15.5)
WBC: 13.2 10*3/uL — ABNORMAL HIGH (ref 4.0–10.5)

## 2015-04-16 LAB — MAGNESIUM: Magnesium: 2 mg/dL (ref 1.7–2.4)

## 2015-04-16 LAB — AMMONIA: Ammonia: 73 umol/L — ABNORMAL HIGH (ref 9–35)

## 2015-04-16 LAB — LACTIC ACID, PLASMA: Lactic Acid, Venous: 1.7 mmol/L (ref 0.5–2.0)

## 2015-04-16 MED ORDER — FUROSEMIDE 10 MG/ML IJ SOLN
20.0000 mg | Freq: Every day | INTRAMUSCULAR | Status: DC
  Administered 2015-04-17 – 2015-04-18 (×2): 20 mg via INTRAVENOUS
  Filled 2015-04-16 (×2): qty 2

## 2015-04-16 NOTE — Clinical Social Work Placement (Signed)
   CLINICAL SOCIAL WORK PLACEMENT  NOTE  Date:  04/16/2015  Patient Details  Name: Howard Rhodes MRN: [id number] Date of Birth: 1931-03-29  Clinical Social Work is seeking post-discharge placement for this patient at the Skilled  Nursing Facility level of care (*CSW will initial, date and re-position this form in  chart as items are completed):  Yes   Patient/family provided with [place] Clinical Social Work Department's list of facilities offering this level of care within the geographic area requested by the patient (or if unable, by the patient's family).  Yes   Patient/family informed of their freedom to choose among providers that offer the needed level of care, that participate in Medicare, Medicaid or managed care program needed by the patient, have an available bed and are willing to accept the patient.  Yes   Patient/family informed of [facility]'s ownership interest in [facility] and [facility], as well as of the fact that they are under no obligation to receive care at these facilities.  PASRR submitted to EDS on       PASRR number received on       Existing PASRR number confirmed on 04/15/15     FL2 transmitted to all facilities in geographic area requested by pt/family on 04/15/15     FL2 transmitted to all facilities within larger geographic area on       Patient informed that his/her managed care company has contracts with or will negotiate with certain facilities, including the following:        Yes   Patient/family informed of bed offers received.  Patient chooses bed at       Physician recommends and patient chooses bed at      Patient to be transferred to   on  .  Patient to be transferred to facility by       Patient family notified on   of transfer.  Name of family member notified:        PHYSICIAN       Additional Comment:    _______________________________________________ Roddie Mc MSW, [place], [place], [phone]

## 2015-04-16 NOTE — Clinical Social Work Note (Signed)
Long discussion with patient's wife, DIL Gwen, patient's son, and RNCM this afternoon regarding placement. Wife still appears to be ambivalent about placement but seems agreeable to short term placement if the patient can be placed close to home. At this time, the closest options for patient will be [facility], [facility], or [facility]. CSW and RNCM had a discussion with the family regarding the nature of the patient care needs. We explained that the patient is really total/custodial care as he has been bed bound for 2 years. With this being said, the family was informed that insurance may deny the patient a short term stay. The family is well aware of this and understands that if [facility] Medicare does not approve the patient for placement he will return home with the wife as the family does not want to pay privately. The family also states they have tried to get the patient Medicaid but he does not qualify. Family shares that they could pay privately but only on a short term basis. Of note, [facility], the family's preferred facility, did state that they could accept the patient but only as private pay because they do not believe [facility] Medicare will pay for patient due to his lack of skilled need. CSW will plan to follow up with patient tomorrow regarding family's decision.     Roddie Mc MSW, [place], [place], [phone]

## 2015-04-16 NOTE — Progress Notes (Signed)
Utilization Review completed. Elvina Bosch RN BSN CM 

## 2015-04-16 NOTE — Progress Notes (Signed)
Patient ID: Howard Rhodes, male   DOB: Jan 19, 1931, 81 y.o.   MRN: [id number]  [facility] PROGRESS NOTE  Howard Rhodes WUJ:[id number] DOB: 1931-07-12 DOA: 04/09/2015 PCP: Howard Ravel, MD   Brief narrative:    81 year old male with past medical history of arthritis, anemia, HLD, presented to [facility] ED for evaluation of confusion, lethargy, poor oral intake 2-3 days in duration.   In ED, pt noted to be altered, VS notable for SBP in 80's with T 103 F, blood work notable for lactic acid 5.9, WBC 22K, blood cultures with g-rods of unclear etiology. Initially admitted under PCCM care requiring pressor support and transferred to [facility] 6/18 for continuation of care.   Assessment/Plan:    Active Problems: Acute encephalopathy - determined to be secondary to sepsis - Patient is alert this am, able to eat but needs assistance  - per family, still not at baseline mental status - PT/OT requested, pt will needs SNF upon discharge and family agrees   Sepsis secondary to gram-negative bacteremia/Klebsiella - per CXR PNA seems to be the source  - Elita Quick transitioned to Rocephin based on the sensitivity report - pt spiked fever 100F on 6/21 and WBC was up but per nursing staff IV line infiltrated and not clear if pt has actually had any ABX that day which could possibly contributed to worsening infection - repeat blood cultures form 6/18 with no growth to date - WBC is trending down and  Tmax 20F, continue Rocephin for now  - pt has gotten 1 L NS bolus and lactic acid is now WNL  - Repeat CBC in the morning  Lower extremity edema - Likely from volume overload in the setting of sepsis and requirement for IV fluids in the setting of hypoalbuminemia - Weight up from 190 lbs to 207 lbs on June 18th, 2016 - Placed on Lasix 20 mg IV twice a day, weight is down to 194 lbs this AM - Daily weights will be monitored - continue Lasix as pt still with significant LE swelling   Transaminitis with  hyperbilirubinemia - Could be due to sepsis. Abdomen is benign and abd [place] with hepatic steatosis, some sludge but no evidence of cholecystitis  - statin still on hold  - LFTs trending down, will repeat again in the morning  Hypomagnesemia with hypokalemia - both electrolytes supplemented and WNL  - repeat BMP and Mg in AM  Acute respiratory failure, left lower lobe pneumonia - with wheezing 6/18  - Fortaz transition to Rocephin based on the sensitivity reports of blood culture - continue BD's as needed  - CT chest done 6/21 due to fever as noted above and atelectasis noted, no further consolidation and small pleural effusion  - respiratory status stable this AM  Moderate PCM - in the context of acute illness - nutritionist consulted  - SLP done, thin diet recommended   Obesity - Body mass index is 33.32 kg/(m^2).  DVT prophylaxis - Lovenox SQ  Code Status: Full.  Family Communication:  Wife updated over the phone Disposition Plan: Not ready for D/C due to IV ABX requirement, also needs IV Lasix for volume overload  IV access:  Peripheral IV  Procedures and diagnostic studies:    Dg Chest 2 View 04/12/2015  Decreased aeration since previously with increased left lower lobe/ retrocardiac consolidation which could represent atelectasis although pneumonia could appear similar.     Ct Head Wo Contrast 04/12/2015  Chronic findings as above without acute intra cranial abnormality.  US Abdomen Complete 04/13/2015    Hepatic increased echogenicity likely indicates underlying steatosis without focal abnormality or intrahepatic ductal dilatation.  Gallbladder sludge without other sonographic evidence for acute cholecystitis.     Dg Chest Portable 1 View 04/10/2015  Central venous catheter tip localizes over the expected location of the junction between the left brachiocephalic vein and SVC  Dg Chest Port 1 View 04/09/2015  Low inspiratory volumes with chronic left basilar  atelectasis and/ or scarring.   Ct Chest Wo Contrast 04/15/2015  Mild linear scarring/atelectasis in the lingula and left lower lobe. No focal consolidation.  Small left pleural effusion, possibly mildly complex/hemorrhagic. Trace right pleural effusion.     Medical Consultants:  None  Other Consultants:  PT OT  IAnti-Infectives:   Elita Quick 6/16 --> changed to Rocephin 04/14/2015 --> Vanc and Rocephin x 1 dose given on admission 6/15   Howard Presto, MD  [facility] Pager [phone]  If 7PM-7AM, please contact night-coverage [email or web address] Password [email or web address] 04/16/2015, 4:50 PM   LOS: 7 days   HPI/Subjective: No events overnight.   Objective: Filed Vitals:   04/15/15 1339 04/15/15 2100 04/16/15 0539 04/16/15 1434  BP: 114/67 105/58 114/81 114/66  Pulse: 88 87 80 94  Temp: 100 F (37.8 C) 99.8 F (37.7 C) 99.7 F (37.6 C) 99 F (37.2 C)  TempSrc: Oral Oral Oral Oral  Resp: Height:      Weight:   88.1 kg (194 lb 3.6 oz)   SpO2: 95% 95% 94% 94%    Intake/Output Summary (Last 24 hours) at 04/16/15 1650 Last data filed at 04/16/15 1629  Gross per 24 hour  Intake    640 ml  Output   1700 ml  Net  -1060 ml    Exam:   General:  Pt is alert, follows commands appropriately, not in acute distress, answers simple questions  Cardiovascular: Regular rhythm, tachycardic, S1/S2, no murmurs, no rubs, no gallops  Respiratory: Clear to auscultation bilaterally, very diminished breath sounds at bases   Abdomen: Soft, non tender, non distended, bowel sounds present, no guarding  Extremities: +2 bilateral pedal and ankle edema, pulses palpable  Data Reviewed: Basic Metabolic Panel:  Recent Labs Lab 04/10/15 0515 04/11/15 0355  04/12/15 0515 04/12/15 1047 04/13/15 0347 04/14/15 0740 04/15/15 0705 04/16/15 0214  NA 143 139  < > 134*  --  133* 135 135 136  K 3.1* 2.6*  < > 3.5  --  3.6 3.5 3.5 3.5  CL 110 108  < > 103  --  102 102 100* 100*  CO2 16* 23  < >  22  --  GLUCOSE 116* 116*  < > 110*  --  102* 108* 108* 109*  BUN 9 8  < > 9  --  <5* CREATININE 0.87 0.64  < > 0.51*  --  0.56* 0.54* 0.58* 0.66  CALCIUM 7.6* 7.9*  < > 7.8*  --  7.9* 8.3* 8.2* 8.2*  MG 1.6* 1.6*  --   --  2.0  --  2.0  --  2.0  PHOS 4.0 1.7*  --   --   --   --   --   --   --   < > = values in this interval not displayed. Liver Function Tests:  Recent Labs Lab 04/12/15 1047 04/13/15 0347 04/14/15 0740 04/16/15 0214  AST 98* 84* 60* 70*  ALT 92* 81* 68* 63  ALKPHOS 230* 224* 213* 203*  BILITOT 4.4* 3.7* 3.5* 1.8*  PROT 5.2* 5.1* 5.4* 5.6*  ALBUMIN 2.1* 2.0* 2.0* 2.2*    Recent Labs Lab 04/10/15 0515 04/13/15 0347 04/16/15 0214  AMMONIA 43* 41* 73*   CBC:  Recent Labs Lab 04/09/15 2133  04/12/15 0515 04/13/15 0347 04/14/15 0740 04/15/15 0705 04/16/15 0214  WBC 21.9*  < > 12.1* 12.4* 13.3* 14.9* 13.2*  NEUTROABS 20.8*  --   --   --   --   --   --   HGB 19.2*  < > 13.6 13.7 13.8 13.6 12.9*  HCT 54.7*  < > 38.9* 38.9* 39.9 38.9* 37.2*  MCV 95.3  < > 92.0 90.0 91.3 92.0 92.3  PLT 229  < > 156 167 175 224 228  < > = values in this interval not displayed. Cardiac Enzymes:  Recent Labs Lab 04/10/15 0520  TROPONINI 0.06*   CBG:  Recent Labs Lab 04/10/15 0409  GLUCAP 113*    Recent Results (from the past 240 hour(s))  Blood Culture (routine x 2)     Status: None   Collection Time: 04/09/15  9:33 PM  Result Value Ref Range Status   Specimen Description BLOOD RIGHT   Final   Special Requests   Final    BOTTLES DRAWN AEROBIC AND ANAEROBIC BLUE 5 CC RED 3 CC   Culture  Setup Time   Final    GRAM NEGATIVE RODS   Culture   Final    KLEBSIELLA PNEUMONIAE   Report Status 04/12/2015 FINAL  Final  Blood Culture (routine x 2)     Status: None   Collection Time: 04/09/15  9:45 PM  Result Value Ref Range Status   Specimen Description BLOOD RIGHT   Final   Special Requests BOTTLES DRAWN  Final    GRAM NEGATIVE RODS    Report Status 04/12/2015 FINAL  Final   Organism ID, Bacteria KLEBSIELLA PNEUMONIAE  Final      Susceptibility   Klebsiella pneumoniae - MIC*    AMPICILLIN 8 RESISTANT Resistant     CEFAZOLIN <=4 SENSITIVE Sensitive     CEFEPIME <=1 SENSITIVE Sensitive     CEFTAZIDIME <=1 SENSITIVE Sensitive     CEFTRIAXONE <=1 SENSITIVE Sensitive     CIPROFLOXACIN <=0.25 SENSITIVE Sensitive     GENTAMICIN <=1 SENSITIVE Sensitive     IMIPENEM <=0.25 SENSITIVE Sensitive     TRIMETH/SULFA <=20 SENSITIVE Sensitive     AMPICILLIN/SULBACTAM <=2 SENSITIVE Sensitive     PIP/TAZO <=4 SENSITIVE Sensitive     * KLEBSIELLA PNEUMONIAE  Urine culture     Status: None   Collection Time: 04/09/15  9:59 PM  Result Value Ref Range Status   Specimen Description URINE, CATHETERIZED  Final   Special Requests NONE  Final   Culture NO GROWTH 2 DAYS  Final   Report Status 04/11/2015 FINAL  Final  MRSA PCR Screening     Status: None   Collection Time: 04/10/15  3:11 AM  Result Value Ref Range Status   MRSA by PCR NEGATIVE NEGATIVE Final  Culture, blood (routine x 2)     Status: None (Preliminary result)   Collection Time: 04/12/15  3:50 PM  Result Value Ref Range Status   Specimen Description BLOOD LFHAND  Final   Special Requests IN PEDIATRIC BOTTLE 1.5CC  Final   Culture NO GROWTH < 24 HOURS  Final   Report Status PENDING  Incomplete  Culture, blood (routine x 2)  Status: None (Preliminary result)   Collection Time: 04/12/15  3:55 PM  Result Value Ref Range Status   Specimen Description BLOOD RIGHT ANTECUBITAL  Final   Culture NO GROWTH < 24 HOURS  Final   Report Status PENDING  Incomplete     Scheduled Meds: . cefTRIAXone (ROCEPHIN)  IV  1 g Intravenous Q24H  . enoxaparin (LOVENOX) injection  40 mg Subcutaneous Daily  . furosemide  20 mg Intravenous BID  . sodium chloride  500 mL Intravenous Once   Continuous Infusions:

## 2015-04-16 NOTE — Progress Notes (Addendum)
Physical Therapy  Note   Reorder received, and noted family concerns re: caring for Mr. Baumhardt at home,   DC to SNF at this time is reasonable:  Before admission, his wife was able to meet his needs, and per her report, he was able to help her with bed mobility (rolling), repositioning, and hygeine;  As of my eval on 6/19, he required Total assist for all bed mobility -- this does represent a functional decline, and therapy at SNF can address this decline;  It is also possible that the need for IV antibiotics can represent a skillable need to get him to SNF;     DC to SNF was my original recommendation, however Mrs. Manson Passey clearly declined, therefore I requested HHtherapies, RN and Aide;   I had discussed this case with Beverely Pace, LCSW, and appreciate his involvement in this case (excellent SW note);    I agree with DC to SNF, and can defer PT to SNF at this time; (Recommend nursing staff to use [facility] lift for OOB to chair;  No further acute care PT needs noted;   Thank you,  Van Clines, [place]  [facility] Pager [phone] Office [phone]      04/16/15 0800  PT - Assessment/Plan  PT Plan Discharge plan needs to be updated  Follow Up Recommendations SNF

## 2015-04-17 LAB — CULTURE, BLOOD (ROUTINE X 2)
Culture: NO GROWTH
Culture: NO GROWTH

## 2015-04-17 LAB — BASIC METABOLIC PANEL
Anion gap: 9 (ref 5–15)
BUN: 9 mg/dL (ref 6–20)
CALCIUM: 8.4 mg/dL — AB (ref 8.9–10.3)
CHLORIDE: 101 mmol/L (ref 101–111)
CO2: 25 mmol/L (ref 22–32)
Creatinine, Ser: 0.55 mg/dL — ABNORMAL LOW (ref 0.61–1.24)
GFR calc Af Amer: >60 mL/min (ref >60)
GLUCOSE: 105 mg/dL — AB (ref 65–99)
POTASSIUM: 4 mmol/L (ref 3.5–5.1)
Sodium: 135 mmol/L (ref 135–145)

## 2015-04-17 LAB — PROCALCITONIN: Procalcitonin: 0.52 ng/mL

## 2015-04-17 LAB — CBC
HCT: 39.2 % (ref 39.0–52.0)
Hemoglobin: 13.1 g/dL (ref 13.0–17.0)
MCH: 31.6 pg (ref 26.0–34.0)
MCHC: 33.4 g/dL (ref 30.0–36.0)
MCV: 94.5 fL (ref 78.0–100.0)
PLATELETS: 322 10*3/uL (ref 150–400)
RBC: 4.15 MIL/uL — ABNORMAL LOW (ref 4.22–5.81)
RDW: 15.4 % (ref 11.5–15.5)
WBC: 12.8 10*3/uL — AB (ref 4.0–10.5)

## 2015-04-17 LAB — URINE CULTURE: CULTURE: NO GROWTH

## 2015-04-17 LAB — MAGNESIUM: Magnesium: 2 mg/dL (ref 1.7–2.4)

## 2015-04-17 MED ORDER — CEFUROXIME AXETIL 500 MG PO TABS
500.0000 mg | ORAL_TABLET | Freq: Two times a day (BID) | ORAL | Status: DC
  Administered 2015-04-17 – 2015-04-18 (×2): 500 mg via ORAL
  Filled 2015-04-17 (×5): qty 1

## 2015-04-17 NOTE — Clinical Social Work Placement (Signed)
   CLINICAL SOCIAL WORK PLACEMENT  NOTE  Date:  04/17/2015  Patient Details  Name: Howard Rhodes MRN: [id number] Date of Birth: Jan 05, 1931  Clinical Social Work is seeking post-discharge placement for this patient at the Skilled  Nursing Facility level of care (*CSW will initial, date and re-position this form in  chart as items are completed):  Yes   Patient/family provided with [place] Clinical Social Work Department's list of facilities offering this level of care within the geographic area requested by the patient (or if unable, by the patient's family).  Yes   Patient/family informed of their freedom to choose among providers that offer the needed level of care, that participate in Medicare, Medicaid or managed care program needed by the patient, have an available bed and are willing to accept the patient.  Yes   Patient/family informed of [facility]'s ownership interest in [facility] and [facility], as well as of the fact that they are under no obligation to receive care at these facilities.  PASRR submitted to EDS on       PASRR number received on       Existing PASRR number confirmed on 04/15/15     FL2 transmitted to all facilities in geographic area requested by pt/family on 04/15/15     FL2 transmitted to all facilities within larger geographic area on       Patient informed that his/her managed care company has contracts with or will negotiate with certain facilities, including the following:        Yes   Patient/family informed of bed offers received.  Patient chooses bed at [facility]     Physician recommends and patient chooses bed at      Patient to be transferred to   on  .  Patient to be transferred to facility by       Patient family notified on   of transfer.  Name of family member notified:        PHYSICIAN Please sign FL2     Additional Comment:    _______________________________________________ Joline Maxcy,  LCSW 04/17/2015, 1:38 PM

## 2015-04-17 NOTE — Progress Notes (Signed)
CSW (Clinical Child psychotherapist) has spoken with pt daughter-in-law multiple times today to answer questions. Pt daughter-in-law notified CSW that they would like to accept private pay bed at [facility]. CSW left message with facility to notify.  Benzion Mesta, LCSWA [phone]

## 2015-04-17 NOTE — Progress Notes (Signed)
CSW Proofreader) spoke with pt daughter-in-law Dedra Skeens. Gwen confirmed she is aware of bed offers and is still making decisions. CSW answered questions and clarified some information for Gwen. Gwen agreeable to making decision today as soon as possible and calling CSW back with decision. Gwen aware that facilities are requiring pt to be approved by insurance prior to admission to facility.  Sonnia Strong, LCSWA [phone]

## 2015-04-17 NOTE — Progress Notes (Addendum)
Patient ID: Howard Rhodes, male   DOB: 03/18/1931, 81 y.o.   MRN: [id number]  [facility] PROGRESS NOTE  Shaydon Roesch EQA:[id number] DOB: 1931/10/14 DOA: 04/09/2015 PCP: Ailene Ravel, MD   Brief narrative:    81 year old male with past medical history of arthritis, anemia, HLD, presented to [facility] ED for evaluation of confusion, lethargy, poor oral intake 2-3 days in duration.   In ED, pt noted to be altered, VS notable for SBP in 80's with T 103 F, blood work notable for lactic acid 5.9, WBC 22K, blood cultures with g-rods of unclear etiology. Initially admitted under PCCM care requiring pressor support and transferred to [facility] 6/18 for continuation of care.   Assessment/Plan:    Active Problems: Acute encephalopathy - determined to be secondary to sepsis - Patient is alert this am, able to eat but needs assistance  - also noted to have elevated ammonia level and unclear if that is related to transaminitis - will repeat ammonia level in AM - PT/OT requested, pt will needs SNF upon discharge and family agrees   Sepsis secondary to gram-negative bacteremia/Klebsiella - per CXR PNA seems to be the source  - Elita Quick transitioned to Rocephin based on the sensitivity report and will now transition to ceftin - pt spiked fever 100F on 6/21 and WBC was up but per nursing staff IV line infiltrated and not clear if pt has actually had any ABX that day which could possibly contributed to worsening infection - repeat blood cultures form 6/18 with no growth to date, urine culture 6/21 with no growth - WBC is trending down and unclear why Tmax still 100.22F, change to Ceftin as noted above  - pt has gotten 1 L NS bolus 6/21 and lactic acid is now WNL 6/22 - Repeat CBC in the morning  Lower extremity edema - Likely from volume overload in the setting of sepsis and requirement for IV fluids in the setting of hypoalbuminemia - Weight up from 190 lbs to 207 lbs on June 18th, 2016 - Placed on Lasix 20 mg  IV twice a day, weight is down to 193 lbs this AM - Daily weights will be monitored - continue Lasix as pt still with LE swelling   Transaminitis with hyperbilirubinemia - Could be due to sepsis. Abdomen is benign and abd [place] with hepatic steatosis, some sludge but no evidence of cholecystitis  - statin still on hold  - LFTs trending down, will repeat again in the morning  Hypomagnesemia with hypokalemia - both electrolytes supplemented and WNL  - repeat BMP and Mg in AM  Acute respiratory failure, left lower lobe pneumonia - Elita Quick transition to Rocephin based on the sensitivity reports of blood culture - continue BD's as needed  - CT chest done 6/21 due to fever as noted above and atelectasis noted, no further consolidation and small pleural effusion  - respiratory status stable this AM - change to ABX Ceftin   Moderate PCM - in the context of acute illness - nutritionist consulted  - SLP done, thin diet recommended   Obesity - Body mass index is 33.21 kg/(m^2).  DVT prophylaxis - Lovenox SQ  Code Status: Full.  Family Communication:  Wife updated over the phone Disposition Plan: Not ready for D/C due to low grade fever 100.6 F, maybe in AM  IV access:  Peripheral IV  Procedures and diagnostic studies:    Dg Chest 2 View 04/12/2015  Decreased aeration since previously with increased left lower lobe/ retrocardiac consolidation which  could represent atelectasis although pneumonia could appear similar.     Ct Head Wo Contrast 04/12/2015  Chronic findings as above without acute intra cranial abnormality.     US Abdomen Complete 04/13/2015    Hepatic increased echogenicity likely indicates underlying steatosis without focal abnormality or intrahepatic ductal dilatation.  Gallbladder sludge without other sonographic evidence for acute cholecystitis.     Dg Chest Portable 1 View 04/10/2015  Central venous catheter tip localizes over the expected location of the junction between  the left brachiocephalic vein and SVC  Dg Chest Port 1 View 04/09/2015  Low inspiratory volumes with chronic left basilar atelectasis and/ or scarring.   Ct Chest Wo Contrast 04/15/2015  Mild linear scarring/atelectasis in the lingula and left lower lobe. No focal consolidation.  Small left pleural effusion, possibly mildly complex/hemorrhagic. Trace right pleural effusion.     Medical Consultants:  None  Other Consultants:  PT OT  IAnti-Infectives:   Elita Quick 6/16 --> changed to Rocephin 04/14/2015 --> change to Ceftin 04/17/2015 --> Vanc and Rocephin x 1 dose given on admission 6/15   Debbora Presto, MD  [facility] Pager [phone]  If 7PM-7AM, please contact night-coverage [email or web address] Password [email or web address] 04/17/2015, 1:22 PM   LOS: 8 days   HPI/Subjective: No events overnight.   Objective: Filed Vitals:   04/16/15 0539 04/16/15 1434 04/16/15 2113 04/17/15 0617  BP: 114/81 114/66 101/55 115/75  Pulse: 80 94 90 78  Temp: 99.7 F (37.6 C) 99 F (37.2 C) 100.6 F (38.1 C)   TempSrc: Oral Oral Oral Oral  Resp: Height:      Weight: 88.1 kg (194 lb 3.6 oz)   87.8 kg (193 lb 9 oz)  SpO2: 94% 94% 94% 92%    Intake/Output Summary (Last 24 hours) at 04/17/15 1322 Last data filed at 04/17/15 6578  Gross per 24 hour  Intake    400 ml  Output    900 ml  Net   -500 ml    Exam:   General:  Pt is alert, follows commands appropriately, not in acute distress, answers simple questions  Cardiovascular: Regular rhythm, tachycardic, S1/S2, no murmurs, no rubs, no gallops  Respiratory: Clear to auscultation bilaterally, very diminished breath sounds at bases   Abdomen: Soft, non tender, non distended, bowel sounds present, no guarding  Extremities: +1-2 bilateral pedal and ankle edema, pulses palpable  Data Reviewed: Basic Metabolic Panel:  Recent Labs Lab 04/11/15 0355  04/12/15 1047 04/13/15 0347 04/14/15 0740 04/15/15 0705 04/16/15 0214 04/17/15 0531  NA  139  < >  --  133* 135 135 136 135  K 2.6*  < >  --  3.6 3.5 3.5 3.5 4.0  CL 108  < >  --  102 102 100* 100* 101  CO2 23  < >  --  GLUCOSE 116*  < >  --  102* 108* 108* 109* 105*  BUN 8  < >  --  <5* CREATININE 0.64  < >  --  0.56* 0.54* 0.58* 0.66 0.55*  CALCIUM 7.9*  < >  --  7.9* 8.3* 8.2* 8.2* 8.4*  MG 1.6*  --  2.0  --  2.0  --  2.0 2.0  PHOS 1.7*  --   --   --   --   --   --   --   < > = values in this interval not displayed. Liver Function  Tests:  Recent Labs Lab 04/12/15 1047 04/13/15 0347 04/14/15 0740 04/16/15 0214  AST 98* 84* 60* 70*  ALT 92* 81* 68* 63  ALKPHOS 230* 224* 213* 203*  BILITOT 4.4* 3.7* 3.5* 1.8*  PROT 5.2* 5.1* 5.4* 5.6*  ALBUMIN 2.1* 2.0* 2.0* 2.2*    Recent Labs Lab 04/13/15 0347 04/16/15 0214  AMMONIA 41* 73*   CBC:  Recent Labs Lab 04/13/15 0347 04/14/15 0740 04/15/15 0705 04/16/15 0214 04/17/15 0531  WBC 12.4* 13.3* 14.9* 13.2* 12.8*  HGB 13.7 13.8 13.6 12.9* 13.1  HCT 38.9* 39.9 38.9* 37.2* 39.2  MCV 90.0 91.3 92.0 92.3 94.5  PLT 167 175 224 228 322     Recent Results (from the past 240 hour(s))  Blood Culture (routine x 2)     Status: None   Collection Time: 04/09/15  9:33 PM  Result Value Ref Range Status   Specimen Description BLOOD RIGHT   Final   Special Requests   Final    BOTTLES DRAWN AEROBIC AND ANAEROBIC BLUE 5 CC RED 3 CC   Culture  Setup Time   Final    GRAM NEGATIVE RODS   Culture   Final    KLEBSIELLA PNEUMONIAE   Report Status 04/12/2015 FINAL  Final  Blood Culture (routine x 2)     Status: None   Collection Time: 04/09/15  9:45 PM  Result Value Ref Range Status   Specimen Description BLOOD RIGHT   Final   Special Requests BOTTLES DRAWN  Final    GRAM NEGATIVE RODS   Report Status 04/12/2015 FINAL  Final   Organism ID, Bacteria KLEBSIELLA PNEUMONIAE  Final      Susceptibility   Klebsiella pneumoniae - MIC*    AMPICILLIN 8 RESISTANT Resistant     CEFAZOLIN <=4 SENSITIVE  Sensitive     CEFEPIME <=1 SENSITIVE Sensitive     CEFTAZIDIME <=1 SENSITIVE Sensitive     CEFTRIAXONE <=1 SENSITIVE Sensitive     CIPROFLOXACIN <=0.25 SENSITIVE Sensitive     GENTAMICIN <=1 SENSITIVE Sensitive     IMIPENEM <=0.25 SENSITIVE Sensitive     TRIMETH/SULFA <=20 SENSITIVE Sensitive     AMPICILLIN/SULBACTAM <=2 SENSITIVE Sensitive     PIP/TAZO <=4 SENSITIVE Sensitive     * KLEBSIELLA PNEUMONIAE  Urine culture     Status: None   Collection Time: 04/09/15  9:59 PM  Result Value Ref Range Status   Specimen Description URINE, CATHETERIZED  Final   Special Requests NONE  Final   Culture NO GROWTH 2 DAYS  Final   Report Status 04/11/2015 FINAL  Final  MRSA PCR Screening     Status: None   Collection Time: 04/10/15  3:11 AM  Result Value Ref Range Status   MRSA by PCR NEGATIVE NEGATIVE Final  Culture, blood (routine x 2)     Status: None (Preliminary result)   Collection Time: 04/12/15  3:50 PM  Result Value Ref Range Status   Specimen Description BLOOD LFHAND  Final   Special Requests IN PEDIATRIC BOTTLE 1.5CC  Final   Culture NO GROWTH < 24 HOURS  Final   Report Status PENDING  Incomplete  Culture, blood (routine x 2)     Status: None (Preliminary result)   Collection Time: 04/12/15  3:55 PM  Result Value Ref Range Status   Specimen Description BLOOD RIGHT ANTECUBITAL  Final   Culture NO GROWTH < 24 HOURS  Final   Report Status PENDING  Incomplete     Scheduled Meds: .  cefTRIAXone (ROCEPHIN)  IV  1 g Intravenous Q24H  . enoxaparin (LOVENOX) injection  40 mg Subcutaneous Daily  . furosemide  20 mg Intravenous Daily  . sodium chloride  500 mL Intravenous Once   Continuous Infusions:

## 2015-04-17 NOTE — Progress Notes (Signed)
CSW (Clinical Child psychotherapist) received call from pt daughter-in-law and she informed CSW that family has spoken with SNF and have a better understanding of what SNF would be providing. At this time, they have decided to have pt dc home with caregivers. Pt will need non-emergent ambulance transfer home. CSW will assist with this at time of dc. RNCM notified of change in dc plan.   Ayeisha Lindenberger, LCSWA [phone]

## 2015-04-17 NOTE — Progress Notes (Signed)
PHARMACIST - PHYSICIAN COMMUNICATION  CONCERNING:  Rocephin   RECOMMENDATION: Day # 9 of Rocephin therapy for Klebsiella sepsis Consider switch to Ceftin / Keflex to complete course  (? 10 to 14 day course)   Thank you Okey Regal, PharmD [phone]

## 2015-04-18 DIAGNOSIS — R7989 Other specified abnormal findings of blood chemistry: Secondary | ICD-10-CM

## 2015-04-18 DIAGNOSIS — R945 Abnormal results of liver function studies: Secondary | ICD-10-CM | POA: Insufficient documentation

## 2015-04-18 LAB — CBC
HCT: 41.6 % (ref 39.0–52.0)
Hemoglobin: 13.9 g/dL (ref 13.0–17.0)
MCH: 31.8 pg (ref 26.0–34.0)
MCHC: 33.4 g/dL (ref 30.0–36.0)
MCV: 95.2 fL (ref 78.0–100.0)
PLATELETS: 343 10*3/uL (ref 150–400)
RBC: 4.37 MIL/uL (ref 4.22–5.81)
RDW: 15.4 % (ref 11.5–15.5)
WBC: 10.8 10*3/uL — AB (ref 4.0–10.5)

## 2015-04-18 LAB — COMPREHENSIVE METABOLIC PANEL
ALK PHOS: 190 U/L — AB (ref 38–126)
ALT: 61 U/L (ref 17–63)
AST: 62 U/L — ABNORMAL HIGH (ref 15–41)
Albumin: 2.4 g/dL — ABNORMAL LOW (ref 3.5–5.0)
Anion gap: 8 (ref 5–15)
BUN: 8 mg/dL (ref 6–20)
CALCIUM: 8.6 mg/dL — AB (ref 8.9–10.3)
CO2: 27 mmol/L (ref 22–32)
Chloride: 101 mmol/L (ref 101–111)
Creatinine, Ser: 0.53 mg/dL — ABNORMAL LOW (ref 0.61–1.24)
Glucose, Bld: 115 mg/dL — ABNORMAL HIGH (ref 65–99)
Potassium: 4.1 mmol/L (ref 3.5–5.1)
SODIUM: 136 mmol/L (ref 135–145)
Total Bilirubin: 1.5 mg/dL — ABNORMAL HIGH (ref 0.3–1.2)
Total Protein: 6.7 g/dL (ref 6.5–8.1)

## 2015-04-18 LAB — AMMONIA: Ammonia: 57 umol/L — ABNORMAL HIGH (ref 9–35)

## 2015-04-18 MED ORDER — LEVALBUTEROL HCL 0.63 MG/3ML IN NEBU: 0.6300 mg | INHALATION_SOLUTION | Freq: Four times a day (QID) | RESPIRATORY_TRACT | Status: DC | PRN

## 2015-04-18 MED ORDER — CEFUROXIME AXETIL 500 MG PO TABS: 500.0000 mg | ORAL_TABLET | Freq: Two times a day (BID) | ORAL | Status: DC

## 2015-04-18 MED ORDER — FUROSEMIDE 20 MG PO TABS: 20.0000 mg | ORAL_TABLET | Freq: Every day | ORAL | Status: AC

## 2015-04-18 NOTE — Discharge Instructions (Signed)

## 2015-04-18 NOTE — Progress Notes (Signed)
Discharge instructions given to daughter [name] via phone conversation.  Patient paperwork given to ambulance drivers to give to family.  PIV removed catheter intact, site clean, dry, and intact.    Discharge vitals Filed Vitals:   04/18/15 1253  BP: 96/60  Pulse: 108  Temp: 99.3 F (37.4 C)  Resp: 18    Discharge medications   Medication List    TAKE these medications        atorvastatin 40 MG tablet  Commonly known as:  LIPITOR  Take 40 mg by mouth daily at 6 PM.     cefUROXime 500 MG tablet  Commonly known as:  CEFTIN  Take 1 tablet (500 mg total) by mouth 2 (two) times daily with a meal.     furosemide 20 MG tablet  Commonly known as:  LASIX  Take 1 tablet (20 mg total) by mouth daily.     HYDROcodone-acetaminophen 5-325 MG per tablet  Commonly known as:  NORCO/VICODIN  Take 1 tablet by mouth every 6 (six) hours as needed for pain.     levalbuterol 0.63 MG/3ML nebulizer solution  Commonly known as:  XOPENEX  Take 3 mLs (0.63 mg total) by nebulization every 6 (six) hours as needed for wheezing or shortness of breath.       Patient transported via ambulance.    04/18/15 at 1559 Danne Harbor, RN

## 2015-04-18 NOTE — Progress Notes (Signed)
Spoke with daughter in Scientific laboratory technician. Family is choosing not to private pay for long term care at this time, and try home health. Miranda at [facility] notified of referral for services.Miranda notified that Dedra Skeens is to be primary contact for all home health planning. Patient will need EMS transport home. Glodean RN at bedside notified that family requesting patient to be discharged via EMS, and they will be at home to receive him; they will not be coming to hospital.

## 2015-04-18 NOTE — Discharge Summary (Addendum)
Physician Discharge Summary  Howard Rhodes JYN:[id number] DOB: 1931-08-21 DOA: 04/09/2015  PCP: Ailene Ravel, MD  Admit date: 04/09/2015 Discharge date: 04/18/2015  Recommendations for Outpatient Follow-up:  1. Pt will need to follow up with PCP in 2-3 weeks post discharge 2. Please obtain BMP to evaluate electrolytes and kidney function 3. Please also check CBC to evaluate Hg and Hct level  Discharge Diagnoses:  Active Problems:   Septic shock   Lactic acidosis   Acute encephalopathy   Transaminitis   Gram-negative bacteremia  Discharge Condition: Stable  Diet recommendation: Heart healthy diet discussed in details    Brief narrative:    81 year old male with past medical history of arthritis, anemia, HLD, presented to [facility] ED for evaluation of confusion, lethargy, poor oral intake 2-3 days in duration.   In ED, pt noted to be altered, VS notable for SBP in 80's with T 103 F, blood work notable for lactic acid 5.9, WBC 22K, blood cultures with g-rods of unclear etiology. Initially admitted under PCCM care requiring pressor support and transferred to [facility] 6/18 for continuation of care.   Assessment/Plan:    Active Problems: Acute encephalopathy - determined to be secondary to sepsis - Patient is alert this am, able to eat but needs assistance  - also noted to have elevated ammonia level and unclear if that is related to transaminitis - will repeat ammonia level in AM - PT/OT requested, pt will needs SNF upon discharge and family agrees   Sepsis secondary to gram-negative bacteremia/Klebsiella - per CXR PNA seems to be the source  - Elita Quick transitioned to Rocephin based on the sensitivity report and will now transition to ceftin - pt spiked fever 100F on 6/21 and WBC was up but per nursing staff IV line infiltrated and not clear if pt has actually had any ABX that day which could possibly contributed to worsening infection - repeat blood cultures form 6/18 with no growth  to date, urine culture 6/21 with no growth - WBC is trending down  - pt has gotten 1 L NS bolus 6/21 and lactic acid was WNL 6/22  Lower extremity edema - Likely from volume overload in the setting of sepsis and requirement for IV fluids in the setting of hypoalbuminemia - Weight up from 190 lbs to 207 lbs on June 18th, 2016 - Placed on Lasix 20 mg IV twice a day, weight is down to 191 lbs this AM - Daily weights will be monitored - continue Lasix as pt still with LE swelling  Transaminitis with hyperbilirubinemia - Could be due to sepsis. Abdomen is benign and abd [place] with hepatic steatosis, some sludge but no evidence of cholecystitis  - statin still on hold  - LFTs trending down, will repeat again in the morning  Hypomagnesemia with hypokalemia - both electrolytes supplemented and WNL   Acute respiratory failure, left lower lobe pneumonia - Elita Quick transition to Rocephin based on the sensitivity reports of blood culture - continue BD's as needed  - CT chest done 6/21 due to fever as noted above and atelectasis noted, no further consolidation and small pleural effusion  - respiratory status stable this AM - change to ABX Ceftin   Moderate PCM - in the context of acute illness - nutritionist consulted  - SLP done, thin diet recommended and pt tolerating well   Obesity - Body mass index is 33.21 kg/(m^2).  Code Status: Full.  Family Communication: Wife updated over the phone Disposition Plan: Not ready for D/C due  to low grade fever 100.6 F, maybe in AM  IV access:  Peripheral IV  Procedures and diagnostic studies:   Dg Chest 2 View 04/12/2015 Decreased aeration since previously with increased left lower lobe/ retrocardiac consolidation which could represent atelectasis although pneumonia could appear similar.   Ct Head Wo Contrast 04/12/2015 Chronic findings as above without acute intra cranial abnormality.   US Abdomen Complete 04/13/2015  Hepatic increased echogenicity likely indicates underlying steatosis without focal abnormality or intrahepatic ductal dilatation. Gallbladder sludge without other sonographic evidence for acute cholecystitis.   Dg Chest Portable 1 View 04/10/2015 Central venous catheter tip localizes over the expected location of the junction between the left brachiocephalic vein and SVC  Dg Chest Port 1 View 04/09/2015 Low inspiratory volumes with chronic left basilar atelectasis and/ or scarring.   Ct Chest Wo Contrast 04/15/2015 Mild linear scarring/atelectasis in the lingula and left lower lobe. No focal consolidation. Small left pleural effusion, possibly mildly complex/hemorrhagic. Trace right pleural effusion.   Medical Consultants:  None  Other Consultants:  PT OT  IAnti-Infectives:   Elita Quick 6/16 --> changed to Rocephin 04/14/2015 --> change to Ceftin 04/17/2015 --> Vanc and Rocephin x 1 dose given on admission 6/15       Discharge Exam: Filed Vitals:   04/18/15 1017  BP: 108/59  Pulse: 88  Temp:   Resp:    Filed Vitals:   04/17/15 2056 04/18/15 0511 04/18/15 0637 04/18/15 1017  BP: 115/58 110/68  108/59  Pulse: 80 84 79 88  Temp: 99.4 F (37.4 C) 98.3 F (36.8 C) 97.8 F (36.6 C)   TempSrc: Oral Oral Oral   Resp: 18 17 1    Height:      Weight:  86.9 kg (191 lb 9.3 oz)    SpO2: 94% 95%  98%    General: Pt is alert, follows commands appropriately, not in acute distress Cardiovascular: Regular rate and rhythm, no rubs, no gallops Respiratory: Clear to auscultation bilaterally, diminished breath sounds at bases  Abdominal: Soft, non tender, non distended, bowel sounds +, no guarding Extremities: +1-2 bilateral LE edema   Discharge Instructions  Discharge Instructions    Diet - low sodium heart healthy    Complete by:  As directed      Increase activity slowly    Complete by:  As directed             Medication List    STOP taking these medications         HYDROcodone-acetaminophen 5-325 MG per tablet  Commonly known as:  NORCO/VICODIN      TAKE these medications        atorvastatin 40 MG tablet  Commonly known as:  LIPITOR  Take 40 mg by mouth daily at 6 PM.     cefUROXime 500 MG tablet  Commonly known as:  CEFTIN  Take 1 tablet (500 mg total) by mouth 2 (two) times daily with a meal.     furosemide 20 MG tablet  Commonly known as:  LASIX  Take 1 tablet (20 mg total) by mouth daily.     levalbuterol 0.63 MG/3ML nebulizer solution  Commonly known as:  XOPENEX  Take 3 mLs (0.63 mg total) by nebulization every 6 (six) hours as needed for wheezing or shortness of breath.             Follow-up Information    Follow up with Ailene Ravel, MD.   Specialty:  Family Medicine   Contact information:  Dr. Burnell Blanks [address] [place] [address] [phone]        The results of significant diagnostics from this hospitalization (including imaging, microbiology, ancillary and laboratory) are listed below for reference.     Microbiology: Recent Results (from the past 240 hour(s))  Blood Culture (routine x 2)     Status: None   Collection Time: 04/09/15  9:33 PM  Result Value Ref Range Status   Specimen Description BLOOD RIGHT FOREARM  Final   Special Requests   Final    BOTTLES DRAWN AEROBIC AND ANAEROBIC BLUE 5 CC RED 3 CC   Culture  Setup Time   Final    GRAM NEGATIVE RODS IN BOTH AEROBIC AND ANAEROBIC BOTTLES CRITICAL RESULT CALLED TO, READ BACK BY AND VERIFIED WITH: L SATTERFIELD, RN AT [phone] 04/10/15 L BENFIELD    Culture   Final    KLEBSIELLA PNEUMONIAE SUSCEPTIBILITIES PERFORMED ON PREVIOUS CULTURE WITHIN THE LAST 5 DAYS.    Report Status 04/12/2015 FINAL  Final  Blood Culture (routine x 2)     Status: None   Collection Time: 04/09/15  9:45 PM  Result Value Ref Range Status   Specimen Description BLOOD RIGHT HAND  Final   Special Requests BOTTLES DRAWN AEROBIC AND ANAEROBIC 10 CC   Final   Culture  Setup Time   Final    GRAM NEGATIVE RODS IN BOTH AEROBIC AND ANAEROBIC BOTTLES CRITICAL RESULT CALLED TO, READ BACK BY AND VERIFIED WITH: L SATTERFIELD,RN AT 8295 04/10/15 L BENFIELD    Culture KLEBSIELLA PNEUMONIAE  Final   Report Status 04/12/2015 FINAL  Final   Organism ID, Bacteria KLEBSIELLA PNEUMONIAE  Final      Susceptibility   Klebsiella pneumoniae - MIC*    AMPICILLIN 8 RESISTANT Resistant     CEFAZOLIN <=4 SENSITIVE Sensitive     CEFEPIME <=1 SENSITIVE Sensitive     CEFTAZIDIME <=1 SENSITIVE Sensitive     CEFTRIAXONE <=1 SENSITIVE Sensitive     CIPROFLOXACIN <=0.25 SENSITIVE Sensitive     GENTAMICIN <=1 SENSITIVE Sensitive     IMIPENEM <=0.25 SENSITIVE Sensitive     TRIMETH/SULFA <=20 SENSITIVE Sensitive     AMPICILLIN/SULBACTAM <=2 SENSITIVE Sensitive     PIP/TAZO <=4 SENSITIVE Sensitive     * KLEBSIELLA PNEUMONIAE  Urine culture     Status: None   Collection Time: 04/09/15  9:59 PM  Result Value Ref Range Status   Specimen Description URINE, CATHETERIZED  Final   Special Requests NONE  Final   Culture NO GROWTH 2 DAYS  Final   Report Status 04/11/2015 FINAL  Final  MRSA PCR Screening     Status: None   Collection Time: 04/10/15  3:11 AM  Result Value Ref Range Status   MRSA by PCR NEGATIVE NEGATIVE Final    Comment:        The GeneXpert MRSA Assay (FDA approved for NASAL specimens only), is one component of a comprehensive MRSA colonization surveillance program. It is not intended to diagnose MRSA infection nor to guide or monitor treatment for MRSA infections.   Culture, blood (routine x 2)     Status: None   Collection Time: 04/12/15  3:50 PM  Result Value Ref Range Status   Specimen Description BLOOD [facility]  Final   Special Requests IN PEDIATRIC BOTTLE 1.5CC  Final   Culture NO GROWTH 5 DAYS  Final   Report Status 04/17/2015 FINAL  Final  Culture, blood (routine x 2)  Status: None   Collection Time: 04/12/15  3:55 PM  Result  Value Ref Range Status   Specimen Description BLOOD RIGHT ANTECUBITAL  Final   Special Requests BOTTLES DRAWN AEROBIC AND ANAEROBIC 10CC  Final   Culture NO GROWTH 5 DAYS  Final   Report Status 04/17/2015 FINAL  Final  Urine culture     Status: None   Collection Time: 04/15/15  3:55 PM  Result Value Ref Range Status   Specimen Description URINE, RANDOM  Final   Special Requests NONE  Final   Culture NO GROWTH 2 DAYS  Final   Report Status 04/17/2015 FINAL  Final     Labs: Basic Metabolic Panel:  Recent Labs Lab 04/12/15 1047  04/14/15 0740 04/15/15 0705 04/16/15 0214 04/17/15 0531 04/18/15 0600  NA  --   < > 135 135 136 135 136  K  --   < > 3.5 3.5 3.5 4.0 4.1  CL  --   < > 102 100* 100* 101 101  CO2  --   < > GLUCOSE  --   < > 108* 108* 109* 105* 115*  BUN  --   < > CREATININE  --   < > 0.54* 0.58* 0.66 0.55* 0.53*  CALCIUM  --   < > 8.3* 8.2* 8.2* 8.4* 8.6*  MG 2.0  --  2.0  --  2.0 2.0  --   < > = values in this interval not displayed. Liver Function Tests:  Recent Labs Lab 04/12/15 1047 04/13/15 0347 04/14/15 0740 04/16/15 0214 04/18/15 0600  AST 98* 84* 60* 70* 62*  ALT 92* 81* 68* 63 61  ALKPHOS 230* 224* 213* 203* 190*  BILITOT 4.4* 3.7* 3.5* 1.8* 1.5*  PROT 5.2* 5.1* 5.4* 5.6* 6.7  ALBUMIN 2.1* 2.0* 2.0* 2.2* 2.4*   No results for input(s): LIPASE, AMYLASE in the last 168 hours.  Recent Labs Lab 04/13/15 0347 04/16/15 0214 04/18/15 0529  AMMONIA 41* 73* 57*   CBC:  Recent Labs Lab 04/14/15 0740 04/15/15 0705 04/16/15 0214 04/17/15 0531 04/18/15 0600  WBC 13.3* 14.9* 13.2* 12.8* 10.8*  HGB 13.8 13.6 12.9* 13.1 13.9  HCT 39.9 38.9* 37.2* 39.2 41.6  MCV 91.3 92.0 92.3 94.5 95.2  PLT 175 224 228 322 343   Cardiac Enzymes: No results for input(s): CKTOTAL, CKMB, CKMBINDEX, TROPONINI in the last 168 hours. BNP: BNP (last 3 results)  Recent Labs  04/09/15 2133  BNP 118.4*    SIGNED: Time coordinating  discharge: 30 minutes  Debbora Presto, MD  [facility] 04/18/2015, 10:58 AM Pager [phone]  If 7PM-7AM, please contact night-coverage [email or web address] Password [email or web address]

## 2015-04-18 NOTE — Clinical Social Work Note (Signed)
Per MD patient ready for DC home. RN and patient/family notified of DC home. Address confirmed by patient/family. DC packet on chart. Ambulance transport will be requested by RN once ready for DC. CSW signing off at this time.     Roddie Mc MSW, [place], [place], [phone]

## 2015-04-22 DIAGNOSIS — Z87891 Personal history of nicotine dependence: Secondary | ICD-10-CM | POA: Diagnosis not present

## 2015-04-22 DIAGNOSIS — D649 Anemia, unspecified: Secondary | ICD-10-CM | POA: Diagnosis not present

## 2015-04-22 DIAGNOSIS — Z8673 Personal history of transient ischemic attack (TIA), and cerebral infarction without residual deficits: Secondary | ICD-10-CM | POA: Diagnosis not present

## 2015-04-22 DIAGNOSIS — A4159 Other Gram-negative sepsis: Secondary | ICD-10-CM | POA: Diagnosis not present

## 2015-04-22 DIAGNOSIS — B961 Klebsiella pneumoniae [K. pneumoniae] as the cause of diseases classified elsewhere: Secondary | ICD-10-CM | POA: Diagnosis not present

## 2015-04-22 DIAGNOSIS — M1990 Unspecified osteoarthritis, unspecified site: Secondary | ICD-10-CM | POA: Diagnosis not present

## 2015-04-22 DIAGNOSIS — G9340 Encephalopathy, unspecified: Secondary | ICD-10-CM | POA: Diagnosis not present

## 2015-04-22 DIAGNOSIS — E785 Hyperlipidemia, unspecified: Secondary | ICD-10-CM | POA: Diagnosis not present

## 2015-04-22 DIAGNOSIS — F328 Other depressive episodes: Secondary | ICD-10-CM | POA: Diagnosis not present

## 2015-04-23 DIAGNOSIS — Z87891 Personal history of nicotine dependence: Secondary | ICD-10-CM | POA: Diagnosis not present

## 2015-04-23 DIAGNOSIS — M1990 Unspecified osteoarthritis, unspecified site: Secondary | ICD-10-CM | POA: Diagnosis not present

## 2015-04-23 DIAGNOSIS — G9340 Encephalopathy, unspecified: Secondary | ICD-10-CM | POA: Diagnosis not present

## 2015-04-23 DIAGNOSIS — E785 Hyperlipidemia, unspecified: Secondary | ICD-10-CM | POA: Diagnosis not present

## 2015-04-23 DIAGNOSIS — F328 Other depressive episodes: Secondary | ICD-10-CM | POA: Diagnosis not present

## 2015-04-23 DIAGNOSIS — Z8673 Personal history of transient ischemic attack (TIA), and cerebral infarction without residual deficits: Secondary | ICD-10-CM | POA: Diagnosis not present

## 2015-04-23 DIAGNOSIS — A4159 Other Gram-negative sepsis: Secondary | ICD-10-CM | POA: Diagnosis not present

## 2015-04-23 DIAGNOSIS — D649 Anemia, unspecified: Secondary | ICD-10-CM | POA: Diagnosis not present

## 2015-04-23 DIAGNOSIS — B961 Klebsiella pneumoniae [K. pneumoniae] as the cause of diseases classified elsewhere: Secondary | ICD-10-CM | POA: Diagnosis not present

## 2015-04-24 DIAGNOSIS — A4159 Other Gram-negative sepsis: Secondary | ICD-10-CM | POA: Diagnosis not present

## 2015-04-24 DIAGNOSIS — F328 Other depressive episodes: Secondary | ICD-10-CM | POA: Diagnosis not present

## 2015-04-24 DIAGNOSIS — M1990 Unspecified osteoarthritis, unspecified site: Secondary | ICD-10-CM | POA: Diagnosis not present

## 2015-04-24 DIAGNOSIS — Z87891 Personal history of nicotine dependence: Secondary | ICD-10-CM | POA: Diagnosis not present

## 2015-04-24 DIAGNOSIS — Z8673 Personal history of transient ischemic attack (TIA), and cerebral infarction without residual deficits: Secondary | ICD-10-CM | POA: Diagnosis not present

## 2015-04-24 DIAGNOSIS — B961 Klebsiella pneumoniae [K. pneumoniae] as the cause of diseases classified elsewhere: Secondary | ICD-10-CM | POA: Diagnosis not present

## 2015-04-24 DIAGNOSIS — D649 Anemia, unspecified: Secondary | ICD-10-CM | POA: Diagnosis not present

## 2015-04-24 DIAGNOSIS — E785 Hyperlipidemia, unspecified: Secondary | ICD-10-CM | POA: Diagnosis not present

## 2015-04-24 DIAGNOSIS — G9340 Encephalopathy, unspecified: Secondary | ICD-10-CM | POA: Diagnosis not present

## 2015-04-28 DIAGNOSIS — G9340 Encephalopathy, unspecified: Secondary | ICD-10-CM | POA: Diagnosis not present

## 2015-04-28 DIAGNOSIS — A4159 Other Gram-negative sepsis: Secondary | ICD-10-CM | POA: Diagnosis not present

## 2015-04-28 DIAGNOSIS — Z8673 Personal history of transient ischemic attack (TIA), and cerebral infarction without residual deficits: Secondary | ICD-10-CM | POA: Diagnosis not present

## 2015-04-28 DIAGNOSIS — F328 Other depressive episodes: Secondary | ICD-10-CM | POA: Diagnosis not present

## 2015-04-28 DIAGNOSIS — D649 Anemia, unspecified: Secondary | ICD-10-CM | POA: Diagnosis not present

## 2015-04-28 DIAGNOSIS — Z87891 Personal history of nicotine dependence: Secondary | ICD-10-CM | POA: Diagnosis not present

## 2015-04-28 DIAGNOSIS — E785 Hyperlipidemia, unspecified: Secondary | ICD-10-CM | POA: Diagnosis not present

## 2015-04-28 DIAGNOSIS — B961 Klebsiella pneumoniae [K. pneumoniae] as the cause of diseases classified elsewhere: Secondary | ICD-10-CM | POA: Diagnosis not present

## 2015-04-28 DIAGNOSIS — M1990 Unspecified osteoarthritis, unspecified site: Secondary | ICD-10-CM | POA: Diagnosis not present

## 2015-04-29 DIAGNOSIS — G9340 Encephalopathy, unspecified: Secondary | ICD-10-CM | POA: Diagnosis not present

## 2015-04-29 DIAGNOSIS — F328 Other depressive episodes: Secondary | ICD-10-CM | POA: Diagnosis not present

## 2015-04-29 DIAGNOSIS — E785 Hyperlipidemia, unspecified: Secondary | ICD-10-CM | POA: Diagnosis not present

## 2015-04-29 DIAGNOSIS — M1990 Unspecified osteoarthritis, unspecified site: Secondary | ICD-10-CM | POA: Diagnosis not present

## 2015-04-29 DIAGNOSIS — D649 Anemia, unspecified: Secondary | ICD-10-CM | POA: Diagnosis not present

## 2015-04-29 DIAGNOSIS — Z87891 Personal history of nicotine dependence: Secondary | ICD-10-CM | POA: Diagnosis not present

## 2015-04-29 DIAGNOSIS — B961 Klebsiella pneumoniae [K. pneumoniae] as the cause of diseases classified elsewhere: Secondary | ICD-10-CM | POA: Diagnosis not present

## 2015-04-29 DIAGNOSIS — A4159 Other Gram-negative sepsis: Secondary | ICD-10-CM | POA: Diagnosis not present

## 2015-04-29 DIAGNOSIS — Z8673 Personal history of transient ischemic attack (TIA), and cerebral infarction without residual deficits: Secondary | ICD-10-CM | POA: Diagnosis not present

## 2015-04-30 DIAGNOSIS — R740 Nonspecific elevation of levels of transaminase and lactic acid dehydrogenase [LDH]: Secondary | ICD-10-CM | POA: Diagnosis not present

## 2015-04-30 DIAGNOSIS — A414 Sepsis due to anaerobes: Secondary | ICD-10-CM | POA: Diagnosis not present

## 2015-04-30 DIAGNOSIS — R600 Localized edema: Secondary | ICD-10-CM | POA: Diagnosis not present

## 2015-04-30 DIAGNOSIS — J150 Pneumonia due to Klebsiella pneumoniae: Secondary | ICD-10-CM | POA: Diagnosis not present

## 2015-05-01 DIAGNOSIS — B961 Klebsiella pneumoniae [K. pneumoniae] as the cause of diseases classified elsewhere: Secondary | ICD-10-CM | POA: Diagnosis not present

## 2015-05-01 DIAGNOSIS — E785 Hyperlipidemia, unspecified: Secondary | ICD-10-CM | POA: Diagnosis not present

## 2015-05-01 DIAGNOSIS — Z87891 Personal history of nicotine dependence: Secondary | ICD-10-CM | POA: Diagnosis not present

## 2015-05-01 DIAGNOSIS — D649 Anemia, unspecified: Secondary | ICD-10-CM | POA: Diagnosis not present

## 2015-05-01 DIAGNOSIS — Z8673 Personal history of transient ischemic attack (TIA), and cerebral infarction without residual deficits: Secondary | ICD-10-CM | POA: Diagnosis not present

## 2015-05-01 DIAGNOSIS — A4159 Other Gram-negative sepsis: Secondary | ICD-10-CM | POA: Diagnosis not present

## 2015-05-01 DIAGNOSIS — G9340 Encephalopathy, unspecified: Secondary | ICD-10-CM | POA: Diagnosis not present

## 2015-05-01 DIAGNOSIS — M1990 Unspecified osteoarthritis, unspecified site: Secondary | ICD-10-CM | POA: Diagnosis not present

## 2015-05-01 DIAGNOSIS — F328 Other depressive episodes: Secondary | ICD-10-CM | POA: Diagnosis not present

## 2015-05-05 DIAGNOSIS — D649 Anemia, unspecified: Secondary | ICD-10-CM | POA: Diagnosis not present

## 2015-05-05 DIAGNOSIS — Z87891 Personal history of nicotine dependence: Secondary | ICD-10-CM | POA: Diagnosis not present

## 2015-05-05 DIAGNOSIS — R740 Nonspecific elevation of levels of transaminase and lactic acid dehydrogenase [LDH]: Secondary | ICD-10-CM | POA: Diagnosis not present

## 2015-05-05 DIAGNOSIS — A414 Sepsis due to anaerobes: Secondary | ICD-10-CM | POA: Diagnosis not present

## 2015-05-05 DIAGNOSIS — M1990 Unspecified osteoarthritis, unspecified site: Secondary | ICD-10-CM | POA: Diagnosis not present

## 2015-05-05 DIAGNOSIS — B961 Klebsiella pneumoniae [K. pneumoniae] as the cause of diseases classified elsewhere: Secondary | ICD-10-CM | POA: Diagnosis not present

## 2015-05-05 DIAGNOSIS — E785 Hyperlipidemia, unspecified: Secondary | ICD-10-CM | POA: Diagnosis not present

## 2015-05-05 DIAGNOSIS — G9340 Encephalopathy, unspecified: Secondary | ICD-10-CM | POA: Diagnosis not present

## 2015-05-05 DIAGNOSIS — A4159 Other Gram-negative sepsis: Secondary | ICD-10-CM | POA: Diagnosis not present

## 2015-05-05 DIAGNOSIS — F328 Other depressive episodes: Secondary | ICD-10-CM | POA: Diagnosis not present

## 2015-05-05 DIAGNOSIS — Z8673 Personal history of transient ischemic attack (TIA), and cerebral infarction without residual deficits: Secondary | ICD-10-CM | POA: Diagnosis not present

## 2015-12-07 ENCOUNTER — Emergency Department (HOSPITAL_COMMUNITY): Payer: Medicare Other

## 2015-12-07 ENCOUNTER — Other Ambulatory Visit: Payer: Self-pay

## 2015-12-07 ENCOUNTER — Encounter (HOSPITAL_COMMUNITY): Payer: Self-pay | Admitting: Emergency Medicine

## 2015-12-07 ENCOUNTER — Inpatient Hospital Stay (HOSPITAL_COMMUNITY)
Admission: EM | Admit: 2015-12-07 | Discharge: 2015-12-19 | DRG: 871 | Disposition: A | Discharge status: Skilled Nursing Facility | Payer: Medicare Other | Attending: Internal Medicine | Admitting: Internal Medicine

## 2015-12-07 DIAGNOSIS — K7689 Other specified diseases of liver: Secondary | ICD-10-CM | POA: Diagnosis not present

## 2015-12-07 DIAGNOSIS — R740 Nonspecific elevation of levels of transaminase and lactic acid dehydrogenase [LDH]: Secondary | ICD-10-CM | POA: Diagnosis not present

## 2015-12-07 DIAGNOSIS — Z8673 Personal history of transient ischemic attack (TIA), and cerebral infarction without residual deficits: Secondary | ICD-10-CM

## 2015-12-07 DIAGNOSIS — L89151 Pressure ulcer of sacral region, stage 1: Secondary | ICD-10-CM | POA: Diagnosis not present

## 2015-12-07 DIAGNOSIS — F0390 Unspecified dementia without behavioral disturbance: Secondary | ICD-10-CM | POA: Diagnosis present

## 2015-12-07 DIAGNOSIS — B961 Klebsiella pneumoniae [K. pneumoniae] as the cause of diseases classified elsewhere: Secondary | ICD-10-CM | POA: Diagnosis present

## 2015-12-07 DIAGNOSIS — N179 Acute kidney failure, unspecified: Secondary | ICD-10-CM | POA: Diagnosis not present

## 2015-12-07 DIAGNOSIS — G9340 Encephalopathy, unspecified: Secondary | ICD-10-CM | POA: Diagnosis present

## 2015-12-07 DIAGNOSIS — J181 Lobar pneumonia, unspecified organism: Secondary | ICD-10-CM | POA: Diagnosis present

## 2015-12-07 DIAGNOSIS — A4150 Gram-negative sepsis, unspecified: Secondary | ICD-10-CM | POA: Diagnosis not present

## 2015-12-07 DIAGNOSIS — F329 Major depressive disorder, single episode, unspecified: Secondary | ICD-10-CM | POA: Diagnosis present

## 2015-12-07 DIAGNOSIS — E860 Dehydration: Secondary | ICD-10-CM | POA: Diagnosis not present

## 2015-12-07 DIAGNOSIS — A4159 Other Gram-negative sepsis: Principal | ICD-10-CM | POA: Diagnosis present

## 2015-12-07 DIAGNOSIS — R748 Abnormal levels of other serum enzymes: Secondary | ICD-10-CM | POA: Insufficient documentation

## 2015-12-07 DIAGNOSIS — A419 Sepsis, unspecified organism: Secondary | ICD-10-CM | POA: Diagnosis not present

## 2015-12-07 DIAGNOSIS — K8000 Calculus of gallbladder with acute cholecystitis without obstruction: Secondary | ICD-10-CM | POA: Diagnosis not present

## 2015-12-07 DIAGNOSIS — R7881 Bacteremia: Secondary | ICD-10-CM

## 2015-12-07 DIAGNOSIS — J9601 Acute respiratory failure with hypoxia: Secondary | ICD-10-CM | POA: Diagnosis not present

## 2015-12-07 DIAGNOSIS — J189 Pneumonia, unspecified organism: Secondary | ICD-10-CM | POA: Diagnosis not present

## 2015-12-07 DIAGNOSIS — R031 Nonspecific low blood-pressure reading: Secondary | ICD-10-CM | POA: Diagnosis not present

## 2015-12-07 DIAGNOSIS — G8220 Paraplegia, unspecified: Secondary | ICD-10-CM | POA: Diagnosis present

## 2015-12-07 DIAGNOSIS — E876 Hypokalemia: Secondary | ICD-10-CM | POA: Diagnosis not present

## 2015-12-07 DIAGNOSIS — Z66 Do not resuscitate: Secondary | ICD-10-CM | POA: Diagnosis present

## 2015-12-07 DIAGNOSIS — R509 Fever, unspecified: Secondary | ICD-10-CM | POA: Diagnosis not present

## 2015-12-07 DIAGNOSIS — R1313 Dysphagia, pharyngeal phase: Secondary | ICD-10-CM | POA: Diagnosis present

## 2015-12-07 DIAGNOSIS — N390 Urinary tract infection, site not specified: Secondary | ICD-10-CM

## 2015-12-07 DIAGNOSIS — K750 Abscess of liver: Secondary | ICD-10-CM

## 2015-12-07 DIAGNOSIS — R6521 Severe sepsis with septic shock: Secondary | ICD-10-CM | POA: Diagnosis not present

## 2015-12-07 DIAGNOSIS — L8990 Pressure ulcer of unspecified site, unspecified stage: Secondary | ICD-10-CM | POA: Insufficient documentation

## 2015-12-07 DIAGNOSIS — E872 Acidosis: Secondary | ICD-10-CM | POA: Diagnosis present

## 2015-12-07 DIAGNOSIS — Z87891 Personal history of nicotine dependence: Secondary | ICD-10-CM | POA: Diagnosis not present

## 2015-12-07 DIAGNOSIS — E785 Hyperlipidemia, unspecified: Secondary | ICD-10-CM | POA: Diagnosis present

## 2015-12-07 DIAGNOSIS — R4182 Altered mental status, unspecified: Secondary | ICD-10-CM | POA: Diagnosis not present

## 2015-12-07 DIAGNOSIS — Z7401 Bed confinement status: Secondary | ICD-10-CM | POA: Diagnosis not present

## 2015-12-07 DIAGNOSIS — K810 Acute cholecystitis: Secondary | ICD-10-CM

## 2015-12-07 LAB — CBC WITH DIFFERENTIAL/PLATELET
BASOS ABS: 0 10*3/uL (ref 0.0–0.1)
Basophils Relative: 0 %
EOS ABS: 0 10*3/uL (ref 0.0–0.7)
EOS PCT: 0 %
HCT: 47.9 % (ref 39.0–52.0)
HEMOGLOBIN: 16.3 g/dL (ref 13.0–17.0)
LYMPHS ABS: 0.6 10*3/uL — AB (ref 0.7–4.0)
Lymphocytes Relative: 2 %
MCH: 32.5 pg (ref 26.0–34.0)
MCHC: 34 g/dL (ref 30.0–36.0)
MCV: 95.4 fL (ref 78.0–100.0)
Monocytes Absolute: 2 10*3/uL — ABNORMAL HIGH (ref 0.1–1.0)
Monocytes Relative: 7 %
NEUTROS PCT: 91 %
Neutro Abs: 25.3 10*3/uL — ABNORMAL HIGH (ref 1.7–7.7)
PLATELETS: 164 10*3/uL (ref 150–400)
RBC: 5.02 MIL/uL (ref 4.22–5.81)
RDW: 15 % (ref 11.5–15.5)
WBC: 27.9 10*3/uL — AB (ref 4.0–10.5)

## 2015-12-07 LAB — COMPREHENSIVE METABOLIC PANEL
ALBUMIN: 2.7 g/dL — AB (ref 3.5–5.0)
ALK PHOS: 83 U/L (ref 38–126)
ALT: 35 U/L (ref 17–63)
AST: 50 U/L — ABNORMAL HIGH (ref 15–41)
Anion gap: 16 — ABNORMAL HIGH (ref 5–15)
BILIRUBIN TOTAL: 2.3 mg/dL — AB (ref 0.3–1.2)
BUN: 15 mg/dL (ref 6–20)
CALCIUM: 8.9 mg/dL (ref 8.9–10.3)
CO2: 21 mmol/L — ABNORMAL LOW (ref 22–32)
Chloride: 104 mmol/L (ref 101–111)
Creatinine, Ser: 0.99 mg/dL (ref 0.61–1.24)
GLUCOSE: 149 mg/dL — AB (ref 65–99)
POTASSIUM: 3.4 mmol/L — AB (ref 3.5–5.1)
Sodium: 141 mmol/L (ref 135–145)
Total Protein: 6.1 g/dL — ABNORMAL LOW (ref 6.5–8.1)

## 2015-12-07 LAB — URINALYSIS, ROUTINE W REFLEX MICROSCOPIC
GLUCOSE, UA: NEGATIVE mg/dL
Hgb urine dipstick: NEGATIVE
Ketones, ur: 15 mg/dL — AB
Leukocytes, UA: NEGATIVE
Nitrite: NEGATIVE
Protein, ur: NEGATIVE mg/dL
Specific Gravity, Urine: 1.021 (ref 1.005–1.030)
pH: 5 (ref 5.0–8.0)

## 2015-12-07 LAB — I-STAT CG4 LACTIC ACID, ED: LACTIC ACID, VENOUS: 6.24 mmol/L — AB (ref 0.5–2.0)

## 2015-12-07 MED ORDER — NOREPINEPHRINE BITARTRATE 1 MG/ML IV SOLN
0.0000 ug/min | Freq: Once | INTRAVENOUS | Status: AC
  Administered 2015-12-08: 2 ug/min via INTRAVENOUS
  Filled 2015-12-07 (×2): qty 4

## 2015-12-07 MED ORDER — VANCOMYCIN HCL IN DEXTROSE 1-5 GM/200ML-% IV SOLN
1000.0000 mg | Freq: Two times a day (BID) | INTRAVENOUS | Status: DC
  Administered 2015-12-08 – 2015-12-09 (×3): 1000 mg via INTRAVENOUS
  Filled 2015-12-07 (×4): qty 200

## 2015-12-07 MED ORDER — VANCOMYCIN HCL 10 G IV SOLR
1250.0000 mg | INTRAVENOUS | Status: AC
  Administered 2015-12-07: 1250 mg via INTRAVENOUS
  Filled 2015-12-07 (×2): qty 1250

## 2015-12-07 MED ORDER — PIPERACILLIN-TAZOBACTAM 3.375 G IVPB 30 MIN
3.3750 g | Freq: Once | INTRAVENOUS | Status: AC
  Administered 2015-12-07: 3.375 g via INTRAVENOUS
  Filled 2015-12-07: qty 50

## 2015-12-07 MED ORDER — SODIUM CHLORIDE 0.9 % IV BOLUS (SEPSIS)
1000.0000 mL | INTRAVENOUS | Status: AC
  Administered 2015-12-07 (×3): 1000 mL via INTRAVENOUS

## 2015-12-07 NOTE — ED Notes (Signed)
Patient's daughter-in-law to be called when bed is available for patient: Howard Rhodes [phone]

## 2015-12-07 NOTE — Progress Notes (Signed)
ANTIBIOTIC CONSULT NOTE - INITIAL  Pharmacy Consult for Vancomycin Indication: sepsis  No Known Allergies  Patient Measurements:   Adjusted Body Weight:   Vital Signs: Temp: 101.7 F (38.7 C) (02/12 2105) Temp Source: Rectal (02/12 2105) BP: 94/57 mmHg (02/12 2145) Pulse Rate: 108 (02/12 2145) Intake/Output from previous day:   Intake/Output from this shift: Total I/O In: 1050 [I.V.:1050] Out: 100 [Urine:100]  Labs:  Recent Labs  12/07/15 2049  WBC 27.9*  HGB 16.3  PLT 164  CREATININE 0.99   CrCl cannot be calculated (Unknown ideal weight.). No results for input(s): VANCOTROUGH, VANCOPEAK, VANCORANDOM, GENTTROUGH, GENTPEAK, GENTRANDOM, TOBRATROUGH, TOBRAPEAK, TOBRARND, AMIKACINPEAK, AMIKACINTROU, AMIKACIN in the last 72 hours.   Microbiology: No results found for this or any previous visit (from the past 720 hour(s)).  Medical History: Past Medical History  Diagnosis Date  . HLD (hyperlipidemia)   . Arthritis   . Anemia   . Depression   . Hip fracture (HCC)   . Stroke [facility])     Medications:  F/u med rec  Assessment: 82 y/o M from [place] [facility] with AMS and previous illness x 2 days.  BP 86/50, sats 91%, Temp 102  ID: Vancomycin to be initiated for Code Sepsis. WBC 27.9. Scr 0.99 (CrCl approx 70). LA 6.24.  Goal of Therapy:  Vancomycin trough level 15-20 mcg/ml  Plan:  Vancomycin  IV x 1 and Zosyn 3.375g IV x 1 in ED Vancomycin 1g IV q12h. Vancomycin trough after 3-5 doses at steady state.   Marquitta Persichetti S. Merilynn Finland, PharmD, BCPS Clinical Staff Pharmacist Pager [phone]  Misty Stanley Stillinger 12/07/2015,10:08 PM

## 2015-12-07 NOTE — ED Notes (Signed)
PER [facility]: Patient to ED from home for possible sepsis. Per EMS, wife stated that patient has had altered mental status and has been "sick" x 2 days - "not acting himself." Hx HTN and bacterial sepsis. Patient alert to name only and responds to voice, follows commands. Patient appears lethargic, EMS VS: 86/50, 91% RA, placed on 3L O2, increased to 94%, ST 125, EtCO2 25. Skin hot to touch, EMS got axillary temp of 102.

## 2015-12-07 NOTE — ED Provider Notes (Signed)
CSN: [id number]     Arrival date & time 12/07/15  2016 History   First MD Initiated Contact with Patient 12/07/15 2028     Chief Complaint  Patient presents with  . Altered Mental Status    Patient is a 82 y.o. male presenting with altered mental status. The history is provided by the patient and the EMS personnel.  Altered Mental Status Level 5 caveat due to altered mental status. Patient was brought in for altered mental status and fever. Fever for EMS. He will not follow commands for me.   Past Medical History  Diagnosis Date  . HLD (hyperlipidemia)   . Arthritis   . Anemia   . Depression   . Hip fracture (HCC)   . Stroke [facility])    Past Surgical History  Procedure Laterality Date  . Hernia repair    . Femur im nail Left 03/09/2013    Procedure: INTRAMEDULLARY (IM) NAIL FEMORAL;  Surgeon: Verlee Rossetti, MD;  Location: [facility] ORS;  Service: Orthopedics;  Laterality: Left;   Family History  Problem Relation Age of Onset  . CAD Brother    Social History  Substance Use Topics  . Smoking status: Former Smoker -- 10 years    Types: Cigarettes, Cigars  . Smokeless tobacco: None  . Alcohol Use: No    Review of Systems  Unable to perform ROS: Mental status change      Allergies  Review of patient's allergies indicates no known allergies.  Home Medications   Prior to Admission medications   Medication Sig Start Date End Date Taking? Authorizing Provider  atorvastatin (LIPITOR) 40 MG tablet Take 40 mg by mouth daily at 6 PM.   Yes Historical Provider, MD  furosemide (LASIX) 20 MG tablet Take 1 tablet (20 mg total) by mouth daily. 04/18/15  Yes Dorothea Ogle, MD  cefUROXime (CEFTIN) 500 MG tablet Take 1 tablet (500 mg total) by mouth 2 (two) times daily with a meal. Patient not taking: Reported on 12/07/2015 04/18/15   Dorothea Ogle, MD  HYDROcodone-acetaminophen (NORCO/VICODIN) 5-325 MG per tablet Take 1 tablet by mouth every 6 (six) hours as needed for pain. Patient not  taking: Reported on 12/07/2015 04/16/13   Vassie Loll, MD  levalbuterol [facility]) 0.63 MG/3ML nebulizer solution Take 3 mLs (0.63 mg total) by nebulization every 6 (six) hours as needed for wheezing or shortness of breath. Patient not taking: Reported on 12/07/2015 04/18/15   Dorothea Ogle, MD   BP 94/66 mmHg  Pulse 93  Temp(Src) 101.7 F (38.7 C) (Rectal)  Resp 26  SpO2 97% Physical Exam  Constitutional: He appears well-developed.  HENT:  Dry mucus membranes.   Eyes: Pupils are equal, round, and reactive to light.  Neck: Neck supple.  Cardiovascular:  Tachycardia.  Pulmonary/Chest:  tachypnea  Abdominal: There is no tenderness.  Genitourinary:  No CVA tenderness  Musculoskeletal: He exhibits no edema.  Neurological:  Patient is awake. Will look to voice but will not follow commands or answer questions.  Skin:  Hot     ED Course  Procedures (including critical care time) Labs Review Labs Reviewed  COMPREHENSIVE METABOLIC PANEL - Abnormal; Notable for the following:    Potassium 3.4 (*)    CO2 21 (*)    Glucose, Bld 149 (*)    Total Protein 6.1 (*)    Albumin 2.7 (*)    AST 50 (*)    Total Bilirubin 2.3 (*)    Anion gap 16 (*)  All other components within normal limits  CBC WITH DIFFERENTIAL/PLATELET - Abnormal; Notable for the following:    WBC 27.9 (*)    Neutro Abs 25.3 (*)    Lymphs Abs 0.6 (*)    Monocytes Absolute 2.0 (*)    All other components within normal limits  URINALYSIS, ROUTINE W REFLEX MICROSCOPIC (NOT AT [facility]) - Abnormal; Notable for the following:    Color, Urine AMBER (*)    APPearance CLOUDY (*)    Bilirubin Urine SMALL (*)    Ketones, ur 15 (*)    All other components within normal limits  I-STAT CG4 LACTIC ACID, ED - Abnormal; Notable for the following:    Lactic Acid, Venous 6.24 (*)    All other components within normal limits  I-STAT CG4 LACTIC ACID, ED - Abnormal; Notable for the following:    Lactic Acid, Venous 3.96 (*)    All  other components within normal limits  CULTURE, BLOOD (ROUTINE X 2)  CULTURE, BLOOD (ROUTINE X 2)  URINE CULTURE    Imaging Review Dg Chest Port 1 View  12/07/2015  CLINICAL DATA:  82 year old presenting with 2 day history of fever and acute mental status changes. Patient was hypoxic upon EMS arrival. EXAM: PORTABLE CHEST 1 VIEW COMPARISON:  CT chest 04/15/2015 and earlier. Chest x-rays 04/14/2015 and earlier. FINDINGS: Markedly suboptimal inspiration. Streaky and patchy airspace opacity in the lung bases and in the right upper lobe, more than expected with just atelectasis due to low volumes. Cardiac silhouette mildly to moderately enlarged for technique and degree of inspiration, unchanged. Mild pulmonary venous hypertension without overt edema. IMPRESSION: Markedly suboptimal inspiration. Multilobar bronchopneumonia suspected involving both lung bases and the right upper lobe. Electronically Signed   By: Hulan Saas M.D.   On: 12/07/2015 21:33   I have personally reviewed and evaluated these images and lab results as part of my medical decision-making.   EKG Interpretation None      MDM   Final diagnoses:  Septic shock (HCC)  CAP (community acquired pneumonia)  Urinary tract infection without hematuria, site unspecified   Patient presents with fever and altered mental status. Reportedly has been worse today. Patient is not able to provide much history. Found to be hypotensive with pneumonia and UTI. Lactic acid is elevated. Continued hypotension after 30 mL/kg fluid bolus. I discussed with critical care after a 15/kg bolus and they requested to finish up the bolus before  ICU admission. continued hypotension. Antibiotics started empirically. Culture sent. Admit to ICU. Discussed with patient's wife who states he is a full code and wants everything done. Patient's wife states his baseline is very good and he would normally be able to give a very good history although he is bedbound.  Patient's daughter-in-law states that he is not normally completely appropriate and normal.    CRITICAL CARE Performed by: Billee Cashing. Total critical care time: 30 minutes Critical care time was exclusive of separately billable procedures and treating other patients. Critical care was necessary to treat or prevent imminent or life-threatening deterioration. Critical care was time spent personally by me on the following activities: development of treatment plan with patient and/or surrogate as well as nursing, discussions with consultants, evaluation of patient's response to treatment, examination of patient, obtaining history from patient or surrogate, ordering and performing treatments and interventions, ordering and review of laboratory studies, ordering and review of radiographic studies, pulse oximetry and re-evaluation of patient's condition.   Benjiman Core, MD 12/08/15 [phone]

## 2015-12-07 NOTE — ED Notes (Signed)
Patient's BP 90/61 after 3L NS. Dr. Rubin Payor made aware.

## 2015-12-08 DIAGNOSIS — F0390 Unspecified dementia without behavioral disturbance: Secondary | ICD-10-CM | POA: Diagnosis present

## 2015-12-08 DIAGNOSIS — K810 Acute cholecystitis: Secondary | ICD-10-CM | POA: Diagnosis not present

## 2015-12-08 DIAGNOSIS — Z7401 Bed confinement status: Secondary | ICD-10-CM | POA: Diagnosis not present

## 2015-12-08 DIAGNOSIS — Z8673 Personal history of transient ischemic attack (TIA), and cerebral infarction without residual deficits: Secondary | ICD-10-CM | POA: Diagnosis not present

## 2015-12-08 DIAGNOSIS — E872 Acidosis: Secondary | ICD-10-CM | POA: Diagnosis present

## 2015-12-08 DIAGNOSIS — J9601 Acute respiratory failure with hypoxia: Secondary | ICD-10-CM | POA: Diagnosis not present

## 2015-12-08 DIAGNOSIS — I639 Cerebral infarction, unspecified: Secondary | ICD-10-CM | POA: Insufficient documentation

## 2015-12-08 DIAGNOSIS — B961 Klebsiella pneumoniae [K. pneumoniae] as the cause of diseases classified elsewhere: Secondary | ICD-10-CM | POA: Diagnosis present

## 2015-12-08 DIAGNOSIS — A4159 Other Gram-negative sepsis: Secondary | ICD-10-CM | POA: Diagnosis present

## 2015-12-08 DIAGNOSIS — E785 Hyperlipidemia, unspecified: Secondary | ICD-10-CM | POA: Diagnosis not present

## 2015-12-08 DIAGNOSIS — I69991 Dysphagia following unspecified cerebrovascular disease: Secondary | ICD-10-CM | POA: Diagnosis not present

## 2015-12-08 DIAGNOSIS — Z66 Do not resuscitate: Secondary | ICD-10-CM | POA: Diagnosis present

## 2015-12-08 DIAGNOSIS — R7989 Other specified abnormal findings of blood chemistry: Secondary | ICD-10-CM | POA: Diagnosis not present

## 2015-12-08 DIAGNOSIS — G9340 Encephalopathy, unspecified: Secondary | ICD-10-CM | POA: Diagnosis not present

## 2015-12-08 DIAGNOSIS — R932 Abnormal findings on diagnostic imaging of liver and biliary tract: Secondary | ICD-10-CM | POA: Diagnosis not present

## 2015-12-08 DIAGNOSIS — L89151 Pressure ulcer of sacral region, stage 1: Secondary | ICD-10-CM | POA: Diagnosis not present

## 2015-12-08 DIAGNOSIS — E876 Hypokalemia: Secondary | ICD-10-CM | POA: Diagnosis not present

## 2015-12-08 DIAGNOSIS — A4150 Gram-negative sepsis, unspecified: Secondary | ICD-10-CM | POA: Diagnosis not present

## 2015-12-08 DIAGNOSIS — K8020 Calculus of gallbladder without cholecystitis without obstruction: Secondary | ICD-10-CM | POA: Diagnosis not present

## 2015-12-08 DIAGNOSIS — N179 Acute kidney failure, unspecified: Secondary | ICD-10-CM | POA: Diagnosis present

## 2015-12-08 DIAGNOSIS — G8220 Paraplegia, unspecified: Secondary | ICD-10-CM | POA: Diagnosis present

## 2015-12-08 DIAGNOSIS — M6281 Muscle weakness (generalized): Secondary | ICD-10-CM | POA: Diagnosis not present

## 2015-12-08 DIAGNOSIS — Z87891 Personal history of nicotine dependence: Secondary | ICD-10-CM | POA: Diagnosis not present

## 2015-12-08 DIAGNOSIS — R740 Nonspecific elevation of levels of transaminase and lactic acid dehydrogenase [LDH]: Secondary | ICD-10-CM | POA: Diagnosis not present

## 2015-12-08 DIAGNOSIS — K750 Abscess of liver: Secondary | ICD-10-CM | POA: Diagnosis not present

## 2015-12-08 DIAGNOSIS — R1311 Dysphagia, oral phase: Secondary | ICD-10-CM | POA: Diagnosis not present

## 2015-12-08 DIAGNOSIS — J189 Pneumonia, unspecified organism: Secondary | ICD-10-CM | POA: Diagnosis not present

## 2015-12-08 DIAGNOSIS — R7881 Bacteremia: Secondary | ICD-10-CM | POA: Diagnosis not present

## 2015-12-08 DIAGNOSIS — R279 Unspecified lack of coordination: Secondary | ICD-10-CM | POA: Diagnosis not present

## 2015-12-08 DIAGNOSIS — R4182 Altered mental status, unspecified: Secondary | ICD-10-CM | POA: Diagnosis present

## 2015-12-08 DIAGNOSIS — A419 Sepsis, unspecified organism: Secondary | ICD-10-CM | POA: Diagnosis not present

## 2015-12-08 DIAGNOSIS — K8000 Calculus of gallbladder with acute cholecystitis without obstruction: Secondary | ICD-10-CM | POA: Diagnosis not present

## 2015-12-08 DIAGNOSIS — F329 Major depressive disorder, single episode, unspecified: Secondary | ICD-10-CM | POA: Diagnosis present

## 2015-12-08 DIAGNOSIS — Z8719 Personal history of other diseases of the digestive system: Secondary | ICD-10-CM | POA: Diagnosis not present

## 2015-12-08 DIAGNOSIS — J181 Lobar pneumonia, unspecified organism: Secondary | ICD-10-CM | POA: Diagnosis present

## 2015-12-08 DIAGNOSIS — I69398 Other sequelae of cerebral infarction: Secondary | ICD-10-CM | POA: Diagnosis not present

## 2015-12-08 DIAGNOSIS — N390 Urinary tract infection, site not specified: Secondary | ICD-10-CM | POA: Diagnosis present

## 2015-12-08 DIAGNOSIS — K7689 Other specified diseases of liver: Secondary | ICD-10-CM | POA: Diagnosis not present

## 2015-12-08 DIAGNOSIS — R1313 Dysphagia, pharyngeal phase: Secondary | ICD-10-CM | POA: Diagnosis present

## 2015-12-08 DIAGNOSIS — R6521 Severe sepsis with septic shock: Secondary | ICD-10-CM | POA: Diagnosis not present

## 2015-12-08 DIAGNOSIS — R262 Difficulty in walking, not elsewhere classified: Secondary | ICD-10-CM | POA: Diagnosis not present

## 2015-12-08 DIAGNOSIS — E860 Dehydration: Secondary | ICD-10-CM | POA: Diagnosis present

## 2015-12-08 LAB — CBC
HEMATOCRIT: 44.1 % (ref 39.0–52.0)
HEMOGLOBIN: 15.2 g/dL (ref 13.0–17.0)
MCH: 32.4 pg (ref 26.0–34.0)
MCHC: 34.5 g/dL (ref 30.0–36.0)
MCV: 94 fL (ref 78.0–100.0)
Platelets: 149 10*3/uL — ABNORMAL LOW (ref 150–400)
RBC: 4.69 MIL/uL (ref 4.22–5.81)
RDW: 15.3 % (ref 11.5–15.5)
WBC: 20.2 10*3/uL — AB (ref 4.0–10.5)

## 2015-12-08 LAB — LACTIC ACID, PLASMA
Lactic Acid, Venous: 2.5 mmol/L (ref 0.5–2.0)
Lactic Acid, Venous: 2.9 mmol/L (ref 0.5–2.0)

## 2015-12-08 LAB — MAGNESIUM: MAGNESIUM: 1.7 mg/dL (ref 1.7–2.4)

## 2015-12-08 LAB — URINE CULTURE: Culture: NO GROWTH

## 2015-12-08 LAB — INFLUENZA PANEL BY PCR (TYPE A & B)
H1N1FLUPCR: NOT DETECTED
Influenza A By PCR: NEGATIVE
Influenza B By PCR: NEGATIVE

## 2015-12-08 LAB — STREP PNEUMONIAE URINARY ANTIGEN: STREP PNEUMO URINARY ANTIGEN: NEGATIVE

## 2015-12-08 LAB — I-STAT CG4 LACTIC ACID, ED: Lactic Acid, Venous: 3.96 mmol/L (ref 0.5–2.0)

## 2015-12-08 LAB — APTT: APTT: 37 s (ref 24–37)

## 2015-12-08 LAB — PROTIME-INR
INR: 1.34 (ref 0.00–1.49)
PROTHROMBIN TIME: 16.7 s — AB (ref 11.6–15.2)

## 2015-12-08 LAB — PROCALCITONIN: Procalcitonin: 6.42 ng/mL

## 2015-12-08 MED ORDER — LEVALBUTEROL HCL 1.25 MG/0.5ML IN NEBU
1.2500 mg | INHALATION_SOLUTION | Freq: Four times a day (QID) | RESPIRATORY_TRACT | Status: DC
  Filled 2015-12-08 (×2): qty 0.5

## 2015-12-08 MED ORDER — ENOXAPARIN SODIUM 40 MG/0.4ML ~~LOC~~ SOLN
40.0000 mg | SUBCUTANEOUS | Status: DC
  Administered 2015-12-08 – 2015-12-12 (×5): 40 mg via SUBCUTANEOUS
  Filled 2015-12-08 (×6): qty 0.4

## 2015-12-08 MED ORDER — PIPERACILLIN-TAZOBACTAM 3.375 G IVPB
3.3750 g | Freq: Three times a day (TID) | INTRAVENOUS | Status: DC
  Administered 2015-12-08 – 2015-12-09 (×6): 3.375 g via INTRAVENOUS
  Filled 2015-12-08 (×8): qty 50

## 2015-12-08 MED ORDER — SODIUM CHLORIDE 0.9 % IV SOLN
INTRAVENOUS | Status: DC
  Administered 2015-12-08: 1000 mL via INTRAVENOUS
  Administered 2015-12-08 – 2015-12-12 (×6): via INTRAVENOUS

## 2015-12-08 MED ORDER — LEVALBUTEROL HCL 1.25 MG/0.5ML IN NEBU
1.2500 mg | INHALATION_SOLUTION | Freq: Four times a day (QID) | RESPIRATORY_TRACT | Status: DC | PRN
  Filled 2015-12-08: qty 0.5

## 2015-12-08 MED ORDER — POTASSIUM CHLORIDE 10 MEQ/100ML IV SOLN
10.0000 meq | INTRAVENOUS | Status: AC
  Administered 2015-12-08 (×2): 10 meq via INTRAVENOUS
  Filled 2015-12-08 (×2): qty 100

## 2015-12-08 NOTE — Progress Notes (Addendum)
PROGRESS NOTE  Howard Rhodes JYN:[id number] DOB: Jul 26, 1931 DOA: 12/07/2015 PCP: Ailene Ravel, MD  HPI/Recap of past 24 hours: Patient is an 82 year old male with past medical history of depression, stroke which has left him paraplegic who was brought in by wife on night of 2/12 after he had been confused and disoriented times several days as well as noted to be diaphoretic. The emergency room, patient can have white count of 28, lactic acid level 6.24, febrile and hypotensive which initially required fluids and then briefly pressors. Chest x-ray noted multi lobar pneumonia.   Patient admitted to the hospital service for septic shock. Prior to leaving the emergency room, able to be weaned off pressor support and maintain stable blood pressures.  Follow-up lactic acid level down to 2.  2/2 bottles of blood cultures growing out gram-negative rods. Urinalysis negative  Patient seen and emergency room before transfer. Confused, not really able to follow too many commands  Assessment/Plan: Principal Problem:   Septic shock (HCC) secondary to community acquired pneumonia causing acute encephalopathy: Patient meets criteria for septic shock on admission given hypotension persisting despite fluid support and briefly requiring pressor support, lactic acidosis, marked leukocytosis, tachypnea and tachycardia with pulmonary source noted on chest x-ray. Improving. Continue sepsis protocol, following lactic acid level. Continue antibiotics . CT scan of head negative, anticipate confusion should improve  Active Problems:    Stroke (cerebrum) (HCC): At baseline   Hypokalemia: From dehydration. Slowly improving   Code Status: Full code   Family Communication: Left message with wife   Disposition Plan: Likely here for several days    Consultants:  None   Procedures:  None   Antibiotics:  IV Zosyn and vancomycin 2/13-present    Objective: BP 98/67 mmHg  Pulse 94  Temp(Src) 99.7 F (37.6  C) (Oral)  Resp 27  SpO2 96%  Intake/Output Summary (Last 24 hours) at 12/08/15 1632 Last data filed at 12/08/15 1007  Gross per 24 hour  Intake   4750 ml  Output    100 ml  Net   4650 ml   There were no vitals filed for this visit.  Exam:   General:  Oriented 1, no acute distress   Cardiovascular: Regular rate and rhythm, S1-S2   Respiratory: Scattered rhonchi   Abdomen: Soft, obese, nontender, hypoactive bowel sounds   Musculoskeletal: Trace pitting edema bilaterally    Data Reviewed: Basic Metabolic Panel:  Recent Labs Lab 12/07/15 2049 12/08/15 0408  NA 141  --   K 3.4*  --   CL 104  --   CO2 21*  --   GLUCOSE 149*  --   BUN 15  --   CREATININE 0.99  --   CALCIUM 8.9  --   MG  --  1.7   Liver Function Tests:  Recent Labs Lab 12/07/15 2049  AST 50*  ALT 35  ALKPHOS 83  BILITOT 2.3*  PROT 6.1*  ALBUMIN 2.7*   No results for input(s): LIPASE, AMYLASE in the last 168 hours. No results for input(s): AMMONIA in the last 168 hours. CBC:  Recent Labs Lab 12/07/15 2049  WBC 27.9*  NEUTROABS 25.3*  HGB 16.3  HCT 47.9  MCV 95.4  PLT 164   Cardiac Enzymes:   No results for input(s): CKTOTAL, CKMB, CKMBINDEX, TROPONINI in the last 168 hours. BNP (last 3 results)  Recent Labs  04/09/15 2133  BNP 118.4*    ProBNP (last 3 results) No results for input(s): PROBNP in the last  8760 hours.  CBG: No results for input(s): GLUCAP in the last 168 hours.  Recent Results (from the past 240 hour(s))  Culture, blood (routine x 2)     Status: None (Preliminary result)   Collection Time: 12/07/15  8:44 PM  Result Value Ref Range Status   Specimen Description BLOOD RIGHT ANTECUBITAL  Final   Special Requests BOTTLES DRAWN AEROBIC AND ANAEROBIC 5CC   Final   Culture  Setup Time   Final    GRAM NEGATIVE RODS IN BOTH AEROBIC AND ANAEROBIC BOTTLES CRITICAL RESULT CALLED TO, READ BACK BY AND VERIFIED WITH: Lydia Guiles 12/08/15 @ 1259 M VESTAL     Culture GRAM NEGATIVE RODS  Final   Report Status PENDING  Incomplete  Culture, blood (routine x 2)     Status: None (Preliminary result)   Collection Time: 12/07/15  8:49 PM  Result Value Ref Range Status   Specimen Description BLOOD RIGHT ANTECUBITAL  Final   Special Requests BOTTLES DRAWN AEROBIC AND ANAEROBIC 5CC   Final   Culture NO GROWTH < 24 HOURS  Final   Report Status PENDING  Incomplete  Urine culture     Status: None (Preliminary result)   Collection Time: 12/07/15  9:18 PM  Result Value Ref Range Status   Specimen Description URINE, RANDOM  Final   Special Requests NONE  Final   Culture NO GROWTH < 24 HOURS  Final   Report Status PENDING  Incomplete     Studies: Dg Chest Port 1 View  12/07/2015  CLINICAL DATA:  82 year old presenting with 2 day history of fever and acute mental status changes. Patient was hypoxic upon EMS arrival. EXAM: PORTABLE CHEST 1 VIEW COMPARISON:  CT chest 04/15/2015 and earlier. Chest x-rays 04/14/2015 and earlier. FINDINGS: Markedly suboptimal inspiration. Streaky and patchy airspace opacity in the lung bases and in the right upper lobe, more than expected with just atelectasis due to low volumes. Cardiac silhouette mildly to moderately enlarged for technique and degree of inspiration, unchanged. Mild pulmonary venous hypertension without overt edema. IMPRESSION: Markedly suboptimal inspiration. Multilobar bronchopneumonia suspected involving both lung bases and the right upper lobe. Electronically Signed   By: Hulan Saas M.D.   On: 12/07/2015 21:33    Scheduled Meds: . enoxaparin (LOVENOX) injection  40 mg Subcutaneous Q24H  . piperacillin-tazobactam (ZOSYN)  IV  3.375 g Intravenous 3 times per day  . vancomycin  1,000 mg Intravenous Q12H    Continuous Infusions: . sodium chloride 1,000 mL (12/08/15 0908)     Time spent: 35 minutes  Hollice Espy  [facility] Pager [phone] . If 7PM-7AM, please contact night-coverage at  [email or web address], password [facility] 12/08/2015, 4:32 PM  LOS: 0 days

## 2015-12-08 NOTE — ED Provider Notes (Signed)
10:42 AM Bed assignment changed from SDU to tele per Dr. Rito Ehrlich.  Deroy Noah, PA-C 12/08/15 1042  Blane Ohara, MD 12/09/15 [phone]

## 2015-12-08 NOTE — ED Notes (Signed)
Myself and Matt, RN cleaned patient; placed a clean dry gown on patient as well as a depends; stretcher linens were changed and 3 chuks were placed underneath patient; patient repositioned on stretcher and a pillow was placed underneath his right side; patient resting at this time

## 2015-12-08 NOTE — H&P (Addendum)
[facility] History and Physical  Howard Rhodes ZOX:[id number] DOB: 06/16/1931 DOA: 12/07/2015  Referring physician: ED physician PCP: Ailene Ravel, MD  Specialists:   Chief Complaint:  AMS and shortness of breath  HPI: Howard Rhodes is a 82 y.o. male with PMH of hyperlipidemia, depression, stroke (not moving both legs at baseline), lung nodules, who presents with altered mental status and shortness of breath.  Per patient's wife, patient has been confused and disoriented in the past 2 days. He has some mild shortness of breath, but not coughing. No fever or chills. He sweats a lot. Patient did not complain of abdominal pain, diarrhea symptoms of UTI. He does not move his legs which is normal to him.   In ED, patient was found to have WBC 27.9, lactate 6.24 negative urinalysis, temperature 101.7, tachycardia, tachypnea, hypotension with blood pressure 86/55, which improved to 107/61 with IV fluid, potassium 3.4, renal function okay. Chest x-ray showed multiple lobar pneumonia.,   EKG: Independently reviewed. QTC 414, mild T-wave inversion in lateral leads and precordial leads.  Where does patient live?   At home   Can patient participate in ADLs?   Some   Review of Systems: Could not be reviewed due to altered mental status.  Allergy: No Known Allergies  Past Medical History  Diagnosis Date  . HLD (hyperlipidemia)   . Arthritis   . Anemia   . Depression   . Hip fracture (HCC)   . Stroke [facility])     Past Surgical History  Procedure Laterality Date  . Hernia repair    . Femur im nail Left 03/09/2013    Procedure: INTRAMEDULLARY (IM) NAIL FEMORAL;  Surgeon: Verlee Rossetti, MD;  Location: [facility] ORS;  Service: Orthopedics;  Laterality: Left;    Social History:  reports that he has quit smoking. His smoking use included Cigarettes and Cigars. He quit after 10 years of use. He does not have any smokeless tobacco history on file. He reports that he does not drink alcohol or use  illicit drugs.  Family History:  Family History  Problem Relation Age of Onset  . CAD Brother      Prior to Admission medications   Medication Sig Start Date End Date Taking? Authorizing Provider  atorvastatin (LIPITOR) 40 MG tablet Take 40 mg by mouth daily at 6 PM.   Yes Historical Provider, MD  furosemide (LASIX) 20 MG tablet Take 1 tablet (20 mg total) by mouth daily. 04/18/15  Yes Dorothea Ogle, MD  cefUROXime (CEFTIN) 500 MG tablet Take 1 tablet (500 mg total) by mouth 2 (two) times daily with a meal. Patient not taking: Reported on 12/07/2015 04/18/15   Dorothea Ogle, MD  HYDROcodone-acetaminophen (NORCO/VICODIN) 5-325 MG per tablet Take 1 tablet by mouth every 6 (six) hours as needed for pain. Patient not taking: Reported on 12/07/2015 04/16/13   Vassie Loll, MD  levalbuterol [facility]) 0.63 MG/3ML nebulizer solution Take 3 mLs (0.63 mg total) by nebulization every 6 (six) hours as needed for wheezing or shortness of breath. Patient not taking: Reported on 12/07/2015 04/18/15   Dorothea Ogle, MD    Physical Exam: Filed Vitals:   12/08/15 0500 12/08/15 0515 12/08/15 0641 12/08/15 0700  BP: 125/93 116/68 108/61 108/66  Pulse: 96 94 93 95  Temp:   100.5 F (38.1 C) 99.7 F (37.6 C)  TempSrc:   Temporal Oral  Resp:  SpO2: 97% 96% 97% 97%   General: Not in acute distress  HEENT:       Eyes: PERRL, EOMI, no scleral icterus.       ENT: No discharge from the ears and nose, no pharynx injection, no tonsillar enlargement.        Neck: No JVD, no bruit, no mass felt. Heme: No neck lymph node enlargement. Cardiac: S1/S2, RRR, tachycardia, No murmurs, No gallops or rubs. Pulm: No rales, wheezing, rhonchi or rubs. Abd: Soft, nondistended, no organomegaly, BS present. Ext: No pitting leg edema bilaterally. 2+DP/PT pulse bilaterally. Musculoskeletal: No joint deformities, No joint redness or warmth, no limitation of ROM in spin. Skin: No rashes.  Neuro: not oriented X3, cranial  nerves II-XII grossly intact, not moving both legs at the baseline Psych: Patient is not psychotic, no suicidal or hemocidal ideation.  Labs on Admission:  Basic Metabolic Panel:  Recent Labs Lab 12/07/15 2049 12/08/15 0408  NA 141  --   K 3.4*  --   CL 104  --   CO2 21*  --   GLUCOSE 149*  --   BUN 15  --   CREATININE 0.99  --   CALCIUM 8.9  --   MG  --  1.7   Liver Function Tests:  Recent Labs Lab 12/07/15 2049  AST 50*  ALT 35  ALKPHOS 83  BILITOT 2.3*  PROT 6.1*  ALBUMIN 2.7*   No results for input(s): LIPASE, AMYLASE in the last 168 hours. No results for input(s): AMMONIA in the last 168 hours. CBC:  Recent Labs Lab 12/07/15 2049  WBC 27.9*  NEUTROABS 25.3*  HGB 16.3  HCT 47.9  MCV 95.4  PLT 164   Cardiac Enzymes: No results for input(s): CKTOTAL, CKMB, CKMBINDEX, TROPONINI in the last 168 hours.  BNP (last 3 results)  Recent Labs  04/09/15 2133  BNP 118.4*    ProBNP (last 3 results) No results for input(s): PROBNP in the last 8760 hours.  CBG: No results for input(s): GLUCAP in the last 168 hours.  Radiological Exams on Admission: Dg Chest Port 1 View  12/07/2015  CLINICAL DATA:  82 year old presenting with 2 day history of fever and acute mental status changes. Patient was hypoxic upon EMS arrival. EXAM: PORTABLE CHEST 1 VIEW COMPARISON:  CT chest 04/15/2015 and earlier. Chest x-rays 04/14/2015 and earlier. FINDINGS: Markedly suboptimal inspiration. Streaky and patchy airspace opacity in the lung bases and in the right upper lobe, more than expected with just atelectasis due to low volumes. Cardiac silhouette mildly to moderately enlarged for technique and degree of inspiration, unchanged. Mild pulmonary venous hypertension without overt edema. IMPRESSION: Markedly suboptimal inspiration. Multilobar bronchopneumonia suspected involving both lung bases and the right upper lobe. Electronically Signed   By: Hulan Saas M.D.   On: 12/07/2015  21:33    Assessment/Plan Principal Problem:   Septic shock (HCC) Active Problems:   HLD (hyperlipidemia)   CAP (community acquired pneumonia)   Acute encephalopathy   Stroke (cerebrum) (HCC)   Hypokalemia   Septic shock due to CAP: Initially patient to septic shock with blood pressure 86/50, which responded to IV fluid. Blood pressure improved to 107/61 when I saw patient in the emergency room. Patient has multiple lobar pneumonia by chest x-ray. Currently he is hemodynamically stable.   - Will admit to SDU - IV Vancomycin and Zosyn - Xopenex Neb prn for SOB - Urine legionella and S. pneumococcal antigen - Follow up blood culture x2, sputum culture, plus Flu pcr - will get Procalcitonin and trend lactic acid level per  sepsis protocol - IVF: 3L of NS bolus in ED, followed by 100 mL per hour of NS  - Hold lasix (not sure why pt is on lasix)  Acute encephalopathy: due to septic shock and CAP -Treat underlying infection and sepsis -Frequent neuro check  HLD: Last LDL was not our records  -continue Lipitor when able to eat (on hold now) -Check FLP  Hx of Stroke (cerebrum) (HCC): -On Lipitor. Hold it now due to AMS  Hypokalemia: K= 3.4 on admission. - Repleted - Check Mg level  DVT ppx: sQ Lovenox  Code Status: Full code Family Communication:  Yes, patient's wife and dughter-in law  at bed side Disposition Plan: Admit to inpatient   Date of Service 12/08/2015    Lorretta Harp [facility] Pager [phone]  If 7PM-7AM, please contact night-coverage [email or web address] Password [facility] 12/08/2015, 8:09 AM

## 2015-12-08 NOTE — Progress Notes (Signed)
Pharmacy Antibiotic Note  Howard Rhodes is a 82 y.o. male admitted on 12/07/2015 with severe sepsis - presumed pulmonary source.  Pharmacy consulted earlier for Vancomycin dosing. Now to add Zosyn per pharmacy.  Plan: Continue Vanc 1gm IV q12h Zoysn 3.375gm IV q8h - extended interval dosing Will f/u renal function, pt's clinical condition and micro data    Temp (24hrs), Avg:100.9 F (38.3 C), Min:100.1 F (37.8 C), Max:101.7 F (38.7 C)   Recent Labs Lab 12/07/15 2049 12/07/15 2100 12/07/15 2353  WBC 27.9*  --   --   CREATININE 0.99  --   --   LATICACIDVEN  --  6.24* 3.96*    CrCl cannot be calculated (Unknown ideal weight.).    No Known Allergies  Antimicrobials this admission: 2/12 Vanc >>  2/12 Zosyn >>   Dose adjustments this admission: n/a  Microbiology results: 2/12 BCx:  2/12 UCx:   Sputum:  MRSA PCR:   Thank you for allowing pharmacy to be a part of this patient's care.  Christoper Fabian, PharmD, BCPS Clinical pharmacist, pager [phone] 12/08/2015 2:52 AM

## 2015-12-08 NOTE — Progress Notes (Signed)
PT Cancellation Note  Patient Details Name: Hervey Wedig MRN: [id number] DOB: 1931/05/02   Cancelled Treatment:    Reason Eval/Treat Not Completed: Other (comment)  Patient on strict bedrest. Will page MD for incr activity orders when appropriate.  Bannon Giammarco 12/08/2015, 3:01 PM  Pager [phone]

## 2015-12-08 NOTE — Consult Note (Addendum)
Name: Howard Rhodes MRN: [id number] DOB: [birth year]    ADMISSION DATE:  12/07/2015 CONSULTATION DATE:  12/07/15  REFERRING MD :  EDP  CHIEF COMPLAINT:  Altered mental status  HISTORY OF PRESENT ILLNESS:   8M w/ history of HLD and lower extremity swelling on Lasix presented after wife and daughter-in-law found him to be less responsive than usual. He was hypotensive on arrival and hypoxemic requiring 3L. He was given 3L NS with improvement in his blood pressure and increased alertness. He is unable to provide any history. His wife says he may have had a fever or sweats yesterday, but she's not sure. He has been more incontinent than usual for the past day or two. No sick contacts. No cough, sputum production, complaints of pain, diarrhea, nausea, vomiting, other urinary complaints. Her daughter-in-law states that she also has some dementia and is not the best historian. The patient is bed bound at home and cannot ambulate. He requires assistance with all ADLs. He has an advanced directive, but they are not sure what it says. His family was unable to make a decision regarding code status and would like for him to remain full code for now.   PAST MEDICAL HISTORY :   has a past medical history of HLD (hyperlipidemia); Arthritis; Anemia; Depression; Hip fracture (HCC); and Stroke (HCC).  has past surgical history that includes Hernia repair and Femur IM nail (Left, 03/09/2013). Prior to Admission medications   Medication Sig Start Date End Date Taking? Authorizing Provider  atorvastatin (LIPITOR) 40 MG tablet Take 40 mg by mouth daily at 6 PM.   Yes Historical Provider, MD  furosemide (LASIX) 20 MG tablet Take 1 tablet (20 mg total) by mouth daily. 04/18/15  Yes Dorothea Ogle, MD  cefUROXime (CEFTIN) 500 MG tablet Take 1 tablet (500 mg total) by mouth 2 (two) times daily with a meal. Patient not taking: Reported on 12/07/2015 04/18/15   Dorothea Ogle, MD  HYDROcodone-acetaminophen (NORCO/VICODIN) 5-325  MG per tablet Take 1 tablet by mouth every 6 (six) hours as needed for pain. Patient not taking: Reported on 12/07/2015 04/16/13   Vassie Loll, MD  levalbuterol [facility]) 0.63 MG/3ML nebulizer solution Take 3 mLs (0.63 mg total) by nebulization every 6 (six) hours as needed for wheezing or shortness of breath. Patient not taking: Reported on 12/07/2015 04/18/15   Dorothea Ogle, MD   No Known Allergies  FAMILY HISTORY:  family history includes CAD in his brother. SOCIAL HISTORY:  reports that he has quit smoking. His smoking use included Cigarettes and Cigars. He quit after 10 years of use. He does not have any smokeless tobacco history on file. He reports that he does not drink alcohol or use illicit drugs.  REVIEW OF SYSTEMS:   Unable to obtain full ROS 2/2 mental status. Pertinent findings in the HPI.  SUBJECTIVE:   VITAL SIGNS: Temp:  [100.1 F (37.8 C)-101.7 F (38.7 C)] 101.7 F (38.7 C) (02/12 2105) Pulse Rate:  [93-131] 93 (02/13 0030) Resp:  [18-35] 24 (02/13 0030) BP: (90-97)/(57-66) 91/62 mmHg (02/13 0030) SpO2:  [91 %-98 %] 96 % (02/13 0030)  PHYSICAL EXAMINATION: General:  WNWD obese WM nad, resting Neuro:  Arouses to voice, follows commands with slight delay. Oriented to self, family but not to place or time HEENT:  Grossly normal Cardiovascular:  RRR s1, s2, no M/R/G, distal pulses palpable Lungs:  Clear to auscultation on anterior exam. No wheezes/rales/ronchi. Diminished breath sounds laterally. Sub-optimal effort. Symmetrical expansion.  Abdomen:  Obese, soft, non-tender, non-distended, +bs Musculoskeletal:  Grossly normal, moves all extremities Skin:     Recent Labs Lab 12/07/15 2049  NA 141  K 3.4*  CL 104  CO2 21*  BUN 15  CREATININE 0.99  GLUCOSE 149*    Recent Labs Lab 12/07/15 2049  HGB 16.3  HCT 47.9  WBC 27.9*  PLT 164   Dg Chest Port 1 View  12/07/2015  CLINICAL DATA:  82 year old presenting with 2 day history of fever and acute  mental status changes. Patient was hypoxic upon EMS arrival. EXAM: PORTABLE CHEST 1 VIEW COMPARISON:  CT chest 04/15/2015 and earlier. Chest x-rays 04/14/2015 and earlier. FINDINGS: Markedly suboptimal inspiration. Streaky and patchy airspace opacity in the lung bases and in the right upper lobe, more than expected with just atelectasis due to low volumes. Cardiac silhouette mildly to moderately enlarged for technique and degree of inspiration, unchanged. Mild pulmonary venous hypertension without overt edema. IMPRESSION: Markedly suboptimal inspiration. Multilobar bronchopneumonia suspected involving both lung bases and the right upper lobe. Electronically Signed   By: Hulan Saas M.D.   On: 12/07/2015 21:33    ASSESSMENT / PLAN: [name] is an 82M with severe sepsis 2/2 pulmonary source. His blood pressure is holding with MAPs in the 70s after 3L NS. PMH is significant for hyperlipidemia and CHF.   # Severe sepsis 2/2 presumed pulmonary source  Follow cultures  Empiric antibiotics with vancomycin and zosyn   Trend lactate  Check viral etiologies, strep pneumo ag, legionella ag  # Acute hypoxemic respiratory failure  New 3L O2 requirement  Wean as tolerated for sats >92%  # Lactic acidosis with mild AKI  Cr 0.99 from 0.53 in July  Trend Cr  Trend lactate  Replace lytes (K)  Check Mag, Phos  # Hypotension  Resolved post 3L NS  Monitor   Maintain MAP >65   The patient is critically ill with multiple organ system failure and requires high complexity decision making for assessment and support, frequent evaluation and titration of therapies, advanced monitoring, review of radiographic studies and interpretation of complex data.   Critical Care Time devoted to patient care services, exclusive of separately billable procedures, described in this note is 35 minutes.   Orville Govern, MD Pulmonary and Critical Care Medicine [facility] Pager: [phone]  12/08/2015, 12:49 AM  2/14 2017 Attending Note:  I have examined the patient and reviewed the notes and orders by Dr. Apolinar Junes. I agree with her assessment and plan. At present, pt is NOT the best historian. He denies any issues. He is comfortable. No family around.  Briefly, 42 M, admitted from home for AMS. Was hypotensive on admission -- better with IVF. Had elevated WBC. CXR suboptimal -- possible asp pna.  Other cultures remain (-). On zosyn and Vanc.  VSS. Still with fever. O2 sats 95% on 2L.  (+) 1.5 L since admission.  Poor effort. Poor cough. Dec BS BLS. Crackles at bases.  Trace edema. Rest of PE -- U/R.  Plan: 1. Cont Vanc and zosyn for now. 2. Check MRSA screen, legionella Ag. Check urine culture. Other cultures remain (-).  3. Likely, he is aspirating. Suggest swallow evaln or MBS. Suggest dietary consult. We may need to modify diet.  4. Observe lactate. 5. Will schedule duoneb qid.  6. Cont other meds.  7. dvt prophylaxis.

## 2015-12-09 DIAGNOSIS — A4150 Gram-negative sepsis, unspecified: Secondary | ICD-10-CM

## 2015-12-09 LAB — BASIC METABOLIC PANEL
Anion gap: 13 (ref 5–15)
BUN: 8 mg/dL (ref 6–20)
CHLORIDE: 106 mmol/L (ref 101–111)
CO2: 23 mmol/L (ref 22–32)
CREATININE: 0.61 mg/dL (ref 0.61–1.24)
Calcium: 8.3 mg/dL — ABNORMAL LOW (ref 8.9–10.3)
GFR calc Af Amer: >60 mL/min (ref >60)
GFR calc non Af Amer: >60 mL/min (ref >60)
Glucose, Bld: 120 mg/dL — ABNORMAL HIGH (ref 65–99)
Potassium: 2.6 mmol/L — CL (ref 3.5–5.1)
SODIUM: 142 mmol/L (ref 135–145)

## 2015-12-09 LAB — CBC
HCT: 42.5 % (ref 39.0–52.0)
Hemoglobin: 14.4 g/dL (ref 13.0–17.0)
MCH: 32 pg (ref 26.0–34.0)
MCHC: 33.9 g/dL (ref 30.0–36.0)
MCV: 94.4 fL (ref 78.0–100.0)
PLATELETS: 123 10*3/uL — AB (ref 150–400)
RBC: 4.5 MIL/uL (ref 4.22–5.81)
RDW: 15.3 % (ref 11.5–15.5)
WBC: 17.3 10*3/uL — AB (ref 4.0–10.5)

## 2015-12-09 LAB — LACTIC ACID, PLASMA: LACTIC ACID, VENOUS: 3.3 mmol/L — AB (ref 0.5–2.0)

## 2015-12-09 LAB — LIPID PANEL
CHOL/HDL RATIO: 8.5 ratio
Cholesterol: 93 mg/dL (ref 0–200)
HDL: 11 mg/dL — ABNORMAL LOW (ref >40)
LDL CALC: 57 mg/dL (ref 0–99)
Triglycerides: 125 mg/dL (ref <150)
VLDL: 25 mg/dL (ref 0–40)

## 2015-12-09 LAB — MRSA PCR SCREENING: MRSA by PCR: NEGATIVE

## 2015-12-09 LAB — MAGNESIUM: MAGNESIUM: 2 mg/dL (ref 1.7–2.4)

## 2015-12-09 LAB — LEGIONELLA ANTIGEN, URINE

## 2015-12-09 MED ORDER — CETYLPYRIDINIUM CHLORIDE 0.05 % MT LIQD
7.0000 mL | Freq: Two times a day (BID) | OROMUCOSAL | Status: DC
  Administered 2015-12-09 (×2): 7 mL via OROMUCOSAL

## 2015-12-09 MED ORDER — IPRATROPIUM-ALBUTEROL 0.5-2.5 (3) MG/3ML IN SOLN
3.0000 mL | Freq: Four times a day (QID) | RESPIRATORY_TRACT | Status: DC
  Administered 2015-12-09 – 2015-12-11 (×9): 3 mL via RESPIRATORY_TRACT
  Filled 2015-12-09 (×9): qty 3

## 2015-12-09 MED ORDER — POTASSIUM CHLORIDE CRYS ER 20 MEQ PO TBCR
40.0000 meq | EXTENDED_RELEASE_TABLET | Freq: Three times a day (TID) | ORAL | Status: AC
Start: 2015-12-09 — End: 2015-12-10
  Administered 2015-12-09 (×2): 40 meq via ORAL
  Filled 2015-12-09 (×2): qty 2

## 2015-12-09 MED ORDER — RESOURCE THICKENUP CLEAR PO POWD
Freq: Once | ORAL | Status: AC
  Administered 2015-12-09: 23:00:00 via ORAL
  Filled 2015-12-09 (×2): qty 125

## 2015-12-09 NOTE — Care Management Important Message (Signed)
Important Message  Patient Details  Name: Howard Rhodes MRN: [id number] Date of Birth: January 07, 1931   Medicare Important Message Given:  Yes    Kyla Balzarine 12/09/2015, 4:52 PM

## 2015-12-09 NOTE — Evaluation (Signed)
Clinical/Bedside Swallow Evaluation Patient Details  Name: Howard Rhodes MRN: [id number] Date of Birth: 08/02/1931  Today's Date: 12/09/2015 Time: SLP Start Time (ACUTE ONLY): 1450 SLP Stop Time (ACUTE ONLY): 1506 SLP Time Calculation (min) (ACUTE ONLY): 16 min  Past Medical History:  Past Medical History  Diagnosis Date  . HLD (hyperlipidemia)   . Arthritis   . Anemia   . Depression   . Hip fracture (HCC)   . Stroke [facility])    Past Surgical History:  Past Surgical History  Procedure Laterality Date  . Hernia repair    . Femur im nail Left 03/09/2013    Procedure: INTRAMEDULLARY (IM) NAIL FEMORAL;  Surgeon: Verlee Rossetti, MD;  Location: [facility];  Service: Orthopedics;  Laterality: Left;   HPI:  82 year old male with past medical history of depression, stroke which has left him paraplegic who was brought in by wife on night of 2/12 after he had been confused and disoriented for several days as well as noted to be diaphoretic.  Dx sepsis secondary to CAP, acute encephalopathy. Followed by SLP services June 2016 admission - concerns for choking incidents with dry solids; he was D/Cd on a dysphagia 3 diet with thin liquids.    Assessment / Plan / Recommendation Clinical Impression  Pt alert, looks at examiner; inert; responds only intermittently to cues/prompts.  Demonstrates potential risk factors for aspiration including waxing/waning attention to bolus, inconsistent cough after consuming thin liquids, occasional oral holding.  CXR with multilobar bronchopneumonia -both lung bases and RUL.  Aspiration could be contributing to his respiratory status, or current respiratory status and MS changes could be creating opportunity for acute aspiration.  Recommend altering diet to dysphagia 1, nectar-thick liquids for now; meds whole in puree.  SLP will follow for PO toleration, family education, and to determine necessity/value of instrumental study.  D/W RN.     Aspiration Risk  Moderate  aspiration risk    Diet Recommendation     Medication Administration: Whole meds with puree    Other  Recommendations Oral Care Recommendations: Oral care BID Other Recommendations: Order thickener from pharmacy   Follow up Recommendations   (tba)    Frequency and Duration min 2x/week  2 weeks       Prognosis Prognosis for Safe Diet Advancement: Good      Swallow Study   General HPI: 82 year old male with past medical history of depression, stroke which has left him paraplegic who was brought in by wife on night of 2/12 after he had been confused and disoriented for several days as well as noted to be diaphoretic.  Dx sepsis secondary to CAP, acute encephalopathy. Followed by SLP services June 2016 admission - concerns for choking incidents with dry solids; he was D/Cd on a dysphagia 3 diet with thin liquids.  Type of Study: Bedside Swallow Evaluation Previous Swallow Assessment: June 2016 Diet Prior to this Study:  (clear liquid diet) Temperature Spikes Noted: No Respiratory Status: Nasal cannula History of Recent Intubation: No Behavior/Cognition: Alert;Confused;Requires cueing Oral Cavity Assessment: Dry Oral Care Completed by SLP: No Oral Cavity - Dentition: Adequate natural dentition Self-Feeding Abilities: Total assist Patient Positioning: Upright in bed Baseline Vocal Quality: Normal Volitional Cough: Cognitively unable to elicit Volitional Swallow: Unable to elicit    Oral/Motor/Sensory Function Overall Oral Motor/Sensory Function:  (symmetric at rest; does not f/c)   Ice Chips Ice chips: Within functional limits Presentation: Spoon   Thin Liquid Thin Liquid: Impaired Presentation: Cup;Straw Pharyngeal  Phase Impairments: Cough -  Immediate    Nectar Thick Nectar Thick Liquid: Within functional limits Presentation: Cup   Honey Thick Honey Thick Liquid: Not tested   Puree Puree: Within functional limits Presentation: Spoon   Solid  Howard Rhodes L. Howard Rhodes, Howard Rhodes  CCC/SLP Pager [phone]    Solid: Not tested (pt shook head no)        Howard Rhodes 12/09/2015,3:21 PM

## 2015-12-09 NOTE — Progress Notes (Addendum)
PROGRESS NOTE Howard Rhodes ZOX:[id number] DOB: 1931-03-13 DOA: 12/07/2015 PCP: Ailene Ravel, MD  HPI/Recap of past 24 hours: Patient is an 82 year old male with past medical history of depression, stroke which has left him paraplegic who was brought in by wife on night of 2/12 after he had been confused and disoriented times several days as well as noted to be diaphoretic. The emergency room, patient can have white count of 28, lactic acid level 6.24, febrile and hypotensive which initially required fluids and then briefly pressors. Chest x-ray noted multi lobar pneumonia.   Patient admitted to the hospital service for septic shock. Prior to leaving the emergency room, able to be weaned off pressor support and maintain stable blood pressures.  Follow-up lactic acid level down to 2.  2/2 bottles of blood cultures growing out gram-negative rods. Urinalysis negative  Today, patient looks a bit more stable. More fatigued. White blood cell count trending downward slowly.  Assessment/Plan: Principal Problem:   Gram-negative Septic shock (HCC) secondary to community acquired pneumonia causing acute encephalopathy: Patient meets criteria for septic shock on admission given hypotension persisting despite fluid support and briefly requiring pressor support, lactic acidosis, marked leukocytosis, tachypnea and tachycardia with pulmonary source noted on chest x-ray. Checking swallow evaluation to rule out aspiration Improving. Continue sepsis protocol, following lactic acid level. Continue antibiotics . CT scan of head negative.  Patient more somnolent, making presumption of encephalopathy resolution difficult  Hyperbilirubinemia: Significant acute illness. We'll recheck in the morning.lft  Active Problems:    Stroke (cerebrum) (HCC): At baseline   Hypokalemia:Replacing. Checking magnesium level  Code Status: DNR-spoke with son who confirmed previous wishes and CODE STATUS changed  Family  Communication: Spoke with son at the bedside  Disposition Plan: Likely here for several days    Consultants:  None   Procedures:  None   Antibiotics:  IV Zosyn and vancomycin 2/13-present    Objective: BP 105/55 mmHg  Pulse 94  Temp(Src) 98.8 F (37.1 C) (Oral)  Resp 25  SpO2 94%  Intake/Output Summary (Last 24 hours) at 12/09/15 1413 Last data filed at 12/09/15 0830  Gross per 24 hour  Intake 1173.3 ml  Output      0 ml  Net 1173.3 ml   There were no vitals filed for this visit.  Exam:   General:   more somnolent, less labored breathing   Cardiovascular: Regular rate and rhythm, S1-S2   Respiratory: Few rhonchi  Abdomen: Soft, obese, nontender, hypoactive bowel sounds   Musculoskeletal: Trace pitting edema bilaterally    Data Reviewed: Basic Metabolic Panel:  Recent Labs Lab 12/07/15 2049 12/08/15 0408 12/09/15 0554  NA 141  --  142  K 3.4*  --  2.6*  CL 104  --  106  CO2 21*  --  23  GLUCOSE 149*  --  120*  BUN 15  --  8  CREATININE 0.99  --  0.61  CALCIUM 8.9  --  8.3*  MG  --  1.7  --    Liver Function Tests:  Recent Labs Lab 12/07/15 2049  AST 50*  ALT 35  ALKPHOS 83  BILITOT 2.3*  PROT 6.1*  ALBUMIN 2.7*   No results for input(s): LIPASE, AMYLASE in the last 168 hours. No results for input(s): AMMONIA in the last 168 hours. CBC:  Recent Labs Lab 12/07/15 2049 12/08/15 1758 12/09/15 0554  WBC 27.9* 20.2* 17.3*  NEUTROABS 25.3*  --   --   HGB 16.3 15.2 14.4  HCT 47.9 44.1 42.5  MCV 95.4 94.0 94.4  PLT 164 149* 123*   Cardiac Enzymes:   No results for input(s): CKTOTAL, CKMB, CKMBINDEX, TROPONINI in the last 168 hours. BNP (last 3 results)  Recent Labs  04/09/15 2133  BNP 118.4*    ProBNP (last 3 results) No results for input(s): PROBNP in the last 8760 hours.  CBG: No results for input(s): GLUCAP in the last 168 hours.  Recent Results (from the past 240 hour(s))  Culture, blood (routine x 2)     Status:  None (Preliminary result)   Collection Time: 12/07/15  8:44 PM  Result Value Ref Range Status   Specimen Description BLOOD RIGHT ANTECUBITAL  Final   Special Requests BOTTLES DRAWN AEROBIC AND ANAEROBIC 5CC   Final   Culture  Setup Time   Final    GRAM NEGATIVE RODS IN BOTH AEROBIC AND ANAEROBIC BOTTLES CRITICAL RESULT CALLED TO, READ BACK BY AND VERIFIED WITH: G SANTANELLA 12/08/15 @ 1259 M VESTAL    Culture GRAM NEGATIVE RODS  Final   Report Status PENDING  Incomplete  Culture, blood (routine x 2)     Status: None (Preliminary result)   Collection Time: 12/07/15  8:49 PM  Result Value Ref Range Status   Specimen Description BLOOD RIGHT ANTECUBITAL  Final   Special Requests BOTTLES DRAWN AEROBIC AND ANAEROBIC 5CC   Final   Culture  Setup Time   Final    GRAM NEGATIVE RODS ANAEROBIC BOTTLE ONLY CRITICAL RESULT CALLED TO, READ BACK BY AND VERIFIED WITH: B. BLAKE,RN AT 0909 ON [id number] BY Lucienne Capers    Culture NO GROWTH < 24 HOURS  Final   Report Status PENDING  Incomplete  Urine culture     Status: None   Collection Time: 12/07/15  9:18 PM  Result Value Ref Range Status   Specimen Description URINE, RANDOM  Final   Special Requests NONE  Final   Culture NO GROWTH 1 DAY  Final   Report Status 12/08/2015 FINAL  Final  MRSA PCR Screening     Status: None   Collection Time: 12/09/15 10:14 AM  Result Value Ref Range Status   MRSA by PCR NEGATIVE NEGATIVE Final    Comment:        The GeneXpert MRSA Assay (FDA approved for NASAL specimens only), is one component of a comprehensive MRSA colonization surveillance program. It is not intended to diagnose MRSA infection nor to guide or monitor treatment for MRSA infections.      Studies: No results found.  Scheduled Meds: . antiseptic oral rinse  7 mL Mouth Rinse BID  . enoxaparin (LOVENOX) injection  40 mg Subcutaneous Q24H  . ipratropium-albuterol  3 mL Nebulization QID  . piperacillin-tazobactam (ZOSYN)  IV  3.375 g  Intravenous 3 times per day  . potassium chloride  40 mEq Oral TID    Continuous Infusions: . sodium chloride 100 mL/hr at 12/09/15 0300     Time spent: 25 minutes  Hollice Espy  [facility] Pager [phone] . If 7PM-7AM, please contact night-coverage at [email or web address], password [facility] 12/09/2015, 2:13 PM  LOS: 1 day

## 2015-12-09 NOTE — Progress Notes (Signed)
OT Cancellation Note  Patient Details Name: Howard Rhodes MRN: [id number] DOB: [birth year]   Cancelled Treatment:    Reason Eval/Treat Not Completed: OT screened, no needs identified, will sign off. Pt with baseline dependence in mobility and ADL. No OT needs identified.  Recommending SNF vs ALF if family not able to continue to care for pt at home. Signing off.  Evern Bio 12/09/2015, 3:31 PM

## 2015-12-09 NOTE — Progress Notes (Signed)
Critical lab for potassium 2.6 and microbiology noted. MD notified. Orders placed.

## 2015-12-09 NOTE — Evaluation (Signed)
Physical Therapy Evaluation & Discharge Patient Details Name: Howard Rhodes MRN: [id number] DOB: 1931-04-21 Today's Date: 12/09/2015   History of Present Illness  Howard Rhodes is a 82 y.o. male with PMH of hyperlipidemia, depression, stroke (not moving both legs at baseline), lung nodules, who presents with altered mental status and shortness of breath.  Found to have multilobar pneumonia with sepsis.  Clinical Impression  Patient presents with dependent mobility which is his functional baseline.  Feel no further skilled PT intervention needed so signing off at this time.  If spouse not longer able to care for patient could consider ALF or custodial care placement.    Follow Up Recommendations No PT follow up    Equipment Recommendations  None recommended by PT    Recommendations for Other Services       Precautions / Restrictions Precautions Precautions: Fall      Mobility  Bed Mobility Overal bed mobility: Needs Assistance Bed Mobility: Supine to Sit;Sit to Supine     Supine to sit: Max assist Sit to supine: +2 for physical assistance;Total assist   General bed mobility comments: assisted to bring pt's legs off bed and to lift trunk, but pt unable to stay upright due to c/o back pain so assited back to bed with NT help.  Transfers                    Ambulation/Gait                Stairs            Wheelchair Mobility    Modified Rankin (Stroke Patients Only)       Balance Overall balance assessment: Needs assistance   Sitting balance-Leahy Scale: Zero Sitting balance - Comments: leans back and to R with pain in back                                     Pertinent Vitals/Pain Pain Assessment: Faces Faces Pain Scale: Hurts whole lot Pain Location: back with mobility Pain Descriptors / Indicators: Aching;Sharp Pain Intervention(s): Repositioned    Home Living Family/patient expects to be discharged to:: Private  residence Living Arrangements: Spouse/significant other Available Help at Discharge: Family;Available 24 hours/day Type of Home: House       Home Layout: One level Home Equipment: Walker - 2 wheels;Wheelchair - manual;Hospital bed      Prior Function Level of Independence: Needs assistance   Gait / Transfers Assistance Needed: non-ambulatory for approx 2 years since hip fx; does not transfer OOB to [facility] or to wheelchair  ADL's / Homemaking Assistance Needed: Total assist; wears adult diapers and wife changes him and gives him bed baths        Hand Dominance   Dominant Hand: Right    Extremity/Trunk Assessment   Upper Extremity Assessment: RUE deficits/detail;LUE deficits/detail RUE Deficits / Details: AAROM limited to about 80 degrees shoulder flexion elbow flexion about 2/5, grip 3-/5     LUE Deficits / Details: AAROM limited to about 90 degrees shoulder flexion elbow flexion about 2/5, grip 3-/5   Lower Extremity Assessment: RLE deficits/detail;LLE deficits/detail RLE Deficits / Details: AAROM limited to about -20 degrees knee extension; strength grossly 2/5, ankle DF about -40 and strength 1+/5 LLE Deficits / Details: AAROM limited to about 30 degrees knee flexion with pain, strength 1/5, ankle fixed in about 70 degrees PF     Communication  Communication: No difficulties  Cognition Arousal/Alertness: Awake/alert Behavior During Therapy: WFL for tasks assessed/performed Overall Cognitive Status: Impaired/Different from baseline Area of Impairment: Orientation;Following commands;Problem solving Orientation Level: Time;Situation     Following Commands: Follows one step commands with increased time     Problem Solving: Slow processing;Decreased initiation;Requires verbal cues;Requires tactile cues      General Comments      Exercises General Exercises - Upper Extremity Shoulder Flexion: AAROM;Both;5 reps;Supine General Exercises - Lower Extremity Ankle  Circles/Pumps: PROM;Both;5 reps;Supine Heel Slides: PROM;Both;5 reps;Supine;AAROM      Assessment/Plan    PT Assessment Patent does not need any further PT services  PT Diagnosis Generalized weakness;Altered mental status   PT Problem List    PT Treatment Interventions     PT Goals (Current goals can be found in the Care Plan section) Acute Rehab PT Goals PT Goal Formulation: All assessment and education complete, DC therapy    Frequency     Barriers to discharge        Co-evaluation               End of Session   Activity Tolerance: Patient limited by pain Patient left: in bed;with call bell/phone within reach;with nursing/sitter in room           Time: 1112-1133 PT Time Calculation (min) (ACUTE ONLY): 21 min   Charges:   PT Evaluation $PT Eval Moderate Complexity: 1 Procedure     PT G CodesElray Rhodes January 05, 2016, 1:08 PM  Sheran Lawless, PT [phone] January 05, 2016

## 2015-12-09 NOTE — Consult Note (Addendum)
WOC wound consult note Reason for Consult: Consult requested for penis.  Pt is frequently incontinent of urine and stool and currently is soiled with liquid stool to inner groin, scrotum, penis and bilat buttocks.   Wound type: When foreskin is retracted, there are red patchy areas of partial thickness skin loss which is revealed, especially the tip of the penis.  Appearance consistent with moisture associated skin damage and is very painful to touch. It is difficult to keep area from becoming soiled. Dressing procedure/placement/frequency: Xeroform to decrease discomfort at this location and promote healing.  This dressing may not remain in place if he continues to be soiled; then it should be discontinued and will remain open to air.  Please re-consult if further assistance is needed.  Thank-you,  Cammie Mcgee MSN, RN, CWOCN, [place], CNS [phone]

## 2015-12-09 NOTE — Progress Notes (Signed)
Critical lab of lactic acid: 3.3. MD notified. Will continue to monitor.

## 2015-12-10 ENCOUNTER — Inpatient Hospital Stay (HOSPITAL_COMMUNITY): Payer: Medicare Other

## 2015-12-10 DIAGNOSIS — A419 Sepsis, unspecified organism: Secondary | ICD-10-CM

## 2015-12-10 LAB — COMPREHENSIVE METABOLIC PANEL
ALT: 53 U/L (ref 17–63)
AST: 42 U/L — ABNORMAL HIGH (ref 15–41)
Albumin: 2 g/dL — ABNORMAL LOW (ref 3.5–5.0)
Alkaline Phosphatase: 71 U/L (ref 38–126)
Anion gap: 10 (ref 5–15)
BUN: 7 mg/dL (ref 6–20)
CHLORIDE: 107 mmol/L (ref 101–111)
CO2: 24 mmol/L (ref 22–32)
Calcium: 8 mg/dL — ABNORMAL LOW (ref 8.9–10.3)
Creatinine, Ser: 0.57 mg/dL — ABNORMAL LOW (ref 0.61–1.24)
Glucose, Bld: 130 mg/dL — ABNORMAL HIGH (ref 65–99)
POTASSIUM: 3 mmol/L — AB (ref 3.5–5.1)
Sodium: 141 mmol/L (ref 135–145)
Total Bilirubin: 3.7 mg/dL — ABNORMAL HIGH (ref 0.3–1.2)
Total Protein: 5 g/dL — ABNORMAL LOW (ref 6.5–8.1)

## 2015-12-10 LAB — CBC
HCT: 40.7 % (ref 39.0–52.0)
Hemoglobin: 14 g/dL (ref 13.0–17.0)
MCH: 32 pg (ref 26.0–34.0)
MCHC: 34.4 g/dL (ref 30.0–36.0)
MCV: 92.9 fL (ref 78.0–100.0)
PLATELETS: 124 10*3/uL — AB (ref 150–400)
RBC: 4.38 MIL/uL (ref 4.22–5.81)
RDW: 15 % (ref 11.5–15.5)
WBC: 15.4 10*3/uL — ABNORMAL HIGH (ref 4.0–10.5)

## 2015-12-10 LAB — CULTURE, BLOOD (ROUTINE X 2)

## 2015-12-10 LAB — LACTIC ACID, PLASMA: LACTIC ACID, VENOUS: 1.6 mmol/L (ref 0.5–2.0)

## 2015-12-10 LAB — AMMONIA: AMMONIA: 62 umol/L — AB (ref 9–35)

## 2015-12-10 MED ORDER — LACTULOSE 10 GM/15ML PO SOLN
20.0000 g | Freq: Two times a day (BID) | ORAL | Status: DC
  Administered 2015-12-10 – 2015-12-11 (×2): 20 g via ORAL
  Filled 2015-12-10 (×2): qty 30

## 2015-12-10 MED ORDER — CIPROFLOXACIN IN D5W 400 MG/200ML IV SOLN
400.0000 mg | Freq: Two times a day (BID) | INTRAVENOUS | Status: AC
  Administered 2015-12-10 (×2): 400 mg via INTRAVENOUS
  Filled 2015-12-10 (×2): qty 200

## 2015-12-10 MED ORDER — CIPROFLOXACIN HCL 500 MG PO TABS
500.0000 mg | ORAL_TABLET | Freq: Two times a day (BID) | ORAL | Status: DC
  Administered 2015-12-11: 500 mg via ORAL
  Filled 2015-12-10: qty 1

## 2015-12-10 MED ORDER — CETYLPYRIDINIUM CHLORIDE 0.05 % MT LIQD
7.0000 mL | Freq: Two times a day (BID) | OROMUCOSAL | Status: DC
  Administered 2015-12-11 – 2015-12-19 (×13): 7 mL via OROMUCOSAL

## 2015-12-10 MED ORDER — POTASSIUM CHLORIDE CRYS ER 20 MEQ PO TBCR
40.0000 meq | EXTENDED_RELEASE_TABLET | Freq: Once | ORAL | Status: AC
  Administered 2015-12-10: 40 meq via ORAL
  Filled 2015-12-10: qty 2

## 2015-12-10 MED ORDER — CHLORHEXIDINE GLUCONATE 0.12 % MT SOLN
15.0000 mL | Freq: Two times a day (BID) | OROMUCOSAL | Status: DC
  Administered 2015-12-10 – 2015-12-19 (×17): 15 mL via OROMUCOSAL
  Filled 2015-12-10 (×19): qty 15

## 2015-12-10 NOTE — Progress Notes (Signed)
Pharmacy Antibiotic Note  Howard Rhodes is a 82 y.o. male admitted on 12/07/2015 with AMS and SOB  She is noted with enterobacter and klebsiella bacteremia and currently on Zosyn. Blood cultures show that enterobacter is resistant to zosyn, both cultures are sensitive to cipro. Spoke with Dr. Sunnie Nielsen and will change to cipro. -WBC= 15.4, afebfrile, SCr = 0.57 CrCl ~ 70.   Plan  Cipro  IV q12 IV today then change to po on 2/16 Consider a total of 14 days treatment Will sign off, please contact pharmacy with any other needs  Temp (24hrs), Avg:98.8 F (37.1 C), Min:98.7 F (37.1 C), Max:98.8 F (37.1 C)   Recent Labs Lab 12/07/15 2049  12/07/15 2353 12/08/15 0408 12/08/15 1632 12/08/15 1758 12/09/15 0554 12/09/15 1615 12/10/15 0340  WBC 27.9*  --   --   --   --  20.2* 17.3*  --  15.4*  CREATININE 0.99  --   --   --   --   --  0.61  --  0.57*  LATICACIDVEN  --   < > 3.96* 2.5* 2.9*  --   --  3.3* 1.6  < > = values in this interval not displayed.  CrCl cannot be calculated (Unknown ideal weight.).    No Known Allergies  Antimicrobials this admission: 2/15 cipro 2/12 Vanc >>  2/12 Zosyn >> 2/15  Dose adjustments this admission: none  Microbiology results: 2/12 BCx x2- enterobacter (resistant to zosyn; sens to cipro, cefepime, bactrim), kleb (sens to cipro, cefepime, bactrim) 2/12 UCx: neg  Thank you for allowing pharmacy to be a part of this patient's care.  Harland German, Pharm D 12/10/2015 10:11 AM

## 2015-12-10 NOTE — Progress Notes (Signed)
Name: Howard Rhodes MRN: [id number] DOB: 1931/08/18    ADMISSION DATE:  12/07/2015 CONSULTATION DATE:  12/07/15  REFERRING MD :  EDP  CHIEF COMPLAINT:  AMS   HISTORY OF PRESENT ILLNESS:   22 M, admitted from home for AMS. Was hypotensive on admission -- better with IVF. Had elevated WBC and lactic acid. CXR suboptimal -- possible asp pna.On zosyn and Vanc.  PCCM was consulted initally for hypotension but pt responded to IVF at the ED and transferred to floors.   No issues the last 24 hrs. Latest lactic acid was N.  There is concern about aspiration. On dysphagia diet now.  Cultures remain (-).     PAST MEDICAL HISTORY :   has a past medical history of HLD (hyperlipidemia); Arthritis; Anemia; Depression; Hip fracture (HCC); and Stroke (HCC).  has past surgical history that includes Hernia repair and Femur IM nail (Left, 03/09/2013). Prior to Admission medications   Medication Sig Start Date End Date Taking? Authorizing Provider  atorvastatin (LIPITOR) 40 MG tablet Take 40 mg by mouth daily at 6 PM.   Yes Historical Provider, MD  furosemide (LASIX) 20 MG tablet Take 1 tablet (20 mg total) by mouth daily. 04/18/15  Yes Dorothea Ogle, MD  cefUROXime (CEFTIN) 500 MG tablet Take 1 tablet (500 mg total) by mouth 2 (two) times daily with a meal. Patient not taking: Reported on 12/07/2015 04/18/15   Dorothea Ogle, MD  HYDROcodone-acetaminophen (NORCO/VICODIN) 5-325 MG per tablet Take 1 tablet by mouth every 6 (six) hours as needed for pain. Patient not taking: Reported on 12/07/2015 04/16/13   Vassie Loll, MD  levalbuterol [facility]) 0.63 MG/3ML nebulizer solution Take 3 mLs (0.63 mg total) by nebulization every 6 (six) hours as needed for wheezing or shortness of breath. Patient not taking: Reported on 12/07/2015 04/18/15   Dorothea Ogle, MD   No Known Allergies  FAMILY HISTORY:  family history includes CAD in his brother. SOCIAL HISTORY:  reports that he has quit smoking. His smoking use  included Cigarettes and Cigars. He quit after 10 years of use. He does not have any smokeless tobacco history on file. He reports that he does not drink alcohol or use illicit drugs.  REVIEW OF SYSTEMS:   Constitutional: Negative for fever, chills, weight loss, malaise/fatigue and diaphoresis.  HENT: Negative for hearing loss, ear pain, nosebleeds, congestion, sore throat, neck pain, tinnitus and ear discharge.   Eyes: Negative for blurred vision, double vision, photophobia, pain, discharge and redness.  Respiratory: On and off cough and SOB.  Cardiovascular: Negative for chest pain, palpitations, orthopnea, claudication, leg swelling and PND.  Gastrointestinal: Negative for heartburn, nausea, vomiting, abdominal pain, diarrhea, constipation, blood in stool and melena.  Genitourinary: Negative for dysuria, urgency, frequency, hematuria and flank pain.  Musculoskeletal: Negative for myalgias, back pain, joint pain and falls.  Skin: Negative for itching and rash.  Neurological: Negative for dizziness, tingling, tremors, sensory change, speech change, focal weakness, seizures, loss of consciousness, weakness and headaches.  Endo/Heme/Allergies: Negative for environmental allergies and polydipsia. Does not bruise/bleed easily. Limited ROS as pt is not the best historian -- if you as him, he does not complain about anything.  Per nurse -- no issues overnight.   SUBJECTIVE:   VITAL SIGNS: Temp:  [98.7 F (37.1 C)-98.8 F (37.1 C)] 98.7 F (37.1 C) (02/15 0410) Pulse Rate:  [94-99] 99 (02/15 0410) Resp:  [16-25] 18 (02/15 0410) BP: (96-117)/(51-61) 96/51 mmHg (02/15 0410) SpO2:  [93 %-97 %]  94 % (02/15 0803)  PHYSICAL EXAMINATION: Vitals:  Filed Vitals:   12/09/15 2054 12/09/15 2145 12/10/15 0410 12/10/15 0803  BP: 117/61  96/51   Pulse: 99 94 99   Temp: 98.8 F (37.1 C)  98.7 F (37.1 C)   TempSrc: Oral  Axillary   Resp: SpO2: 97% 97% 96% 94%    Constitutional/General:   Pleasant, well-nourished, well-developed, not in any distress,  Comfortably seating.  Well kempt  There is no weight on file to calculate BMI. Wt Readings from Last 3 Encounters:  04/18/15 191 lb 9.3 oz (86.9 kg)  04/16/13 195 lb 5.2 oz (88.6 kg)  03/08/13 220 lb (99.791 kg)    HEENT: Pupils equal and reactive to light and accommodation. Anicteric sclerae. Normal nasal mucosa.   No oral  lesions,  mouth clear,  oropharynx clear, no postnasal drip. (-) Oral thrush. No dental caries.  Airway - Mallampati class III  Neck: No masses. Midline trachea. No JVD, (-) LAD. (-) bruits appreciated.  Respiratory/Chest: Grossly normal chest. (-) deformity. (-) Accessory muscle use.  Symmetric expansion. (-) Tenderness on palpation.  Resonant on percussion.  Diminished BS on both lower lung zones. (-) wheezing,  Rhonchi Bibasilar crackles. (-) egophony  Cardiovascular: Regular rate and  rhythm, heart sounds normal, no murmur or gallops, no peripheral edema  Gastrointestinal:  Normal bowel sounds. Soft, non-tender. No hepatosplenomegaly.  (-) masses.   Musculoskeletal:  Normal muscle tone. Normal gait.   Extremities: Grossly normal. (-) clubbing, cyanosis.  (-) edema  Skin: (-) rash,lesions seen.   Neurological/Psychiatric :difficult to elicit -- answers simple questions.  Normal mood and affect    Recent Labs Lab 12/07/15 2049 12/09/15 0554 12/10/15 0340  NA 141 142 141  K 3.4* 2.6* 3.0*  CL 104 106 107  CO2 21* 23 24  BUN CREATININE 0.99 0.61 0.57*  GLUCOSE 149* 120* 130*    Recent Labs Lab 12/08/15 1758 12/09/15 0554 12/10/15 0340  HGB 15.2 14.4 14.0  HCT 44.1 42.5 40.7  WBC 20.2* 17.3* 15.4*  PLT 149* 123* 124*   No results found.  ASSESSMENT / PLAN: 71M initially with AMS/sepsis, lactic acidosis, elevated WBC and lactic acid. Clinically improved with IVF and abx. Cultures remain (-). Lactic acid normal.  Possible Asp PNA. Possible UTI.  Has  baseline dementia.   1. Cont Vanc and zosyn for now. If cultures remain (-), suggest to switch to augmentin PO  later today or in am to complete 7 days. Pls add probiotic.  2. Agree with MBS. May need to further modify diet. 3. Asp precaution. 4. DVT prophylaxis. 5. Cont other meds. 6. Will sign off for now. Call back if with any questions.    Lance Sell A. Christene Slates, MD Pulmonary and Critical Care Medicine [facility] Pager : [phone] After 3 pm or if no response, call [phone]  12/10/2015, 9:00 AM

## 2015-12-10 NOTE — Progress Notes (Signed)
   MBSS complete. Full report located under chart review in imaging section. Recommend upgrade to Dys 3 and continue nectar thick liquids   Breck Coons Onward.Ed [employer] [phone]

## 2015-12-10 NOTE — Progress Notes (Signed)
PROGRESS NOTE  Makhai Fulco ZOX:[id number] DOB: [birth year] DOA: 12/07/2015 PCP: Ailene Ravel, MD  HPI/Recap of past 24 hours: Patient is an 82 year old male with past medical history of depression, stroke which has left him paraplegic who was brought in by wife on night of 2/12 after he had been confused and disoriented times several days as well as noted to be diaphoretic. The emergency room, patient can have white count of 28, lactic acid level 6.24, febrile and hypotensive which initially required fluids and then briefly pressors. Chest x-ray noted multi lobar pneumonia.   Patient admitted to the hospital service for septic shock. Prior to leaving the emergency room, able to be weaned off pressor support and maintain stable blood pressures.  Follow-up lactic acid level down to 2.  2/2 bottles of blood cultures growing out gram-negative rods. Urinalysis negative  Today, patient looks a bit more stable. More fatigued. White blood cell count trending downward slowly.  Assessment/Plan:  Gram-negative Septic shock (HCC), klebsiella, enteobacter Bacteremia  Patient meets criteria for septic shock on admission given hypotension persisting despite fluid support and briefly requiring pressor support, lactic acidosis, marked leukocytosis, tachypnea and tachycardia with pulmonary source noted on chest x-ray. Change Antibiotics to cipro, both organism sensitive to cipro.  Will need repeat Blood culture.  Check RUQ [place].   Multilobar, broncho-PNA Continue with cipro.   Encephalopathy; Check ammonia level. Start lactulose.  Suspect related to acute illness   Hyperbilirubinemia: Significant acute illness. Elevated, check ammonia level. Check RUQ [place].   Dysphagia; on dysphagia 3 diet nectar thick.    Stroke (cerebrum) [facility]): At baseline Hypokalemia:Replacing. Mg normal   Code Status: DNR  Family Communication: no family at bedsi.   Disposition Plan: Likely here for several days     Consultants:  None   Procedures:  None   Antibiotics:  IV Zosyn and vancomycin 2/13-present --- cipro    Objective: BP 96/51 mmHg  Pulse 99  Temp(Src) 98.7 F (37.1 C) (Axillary)  Resp 18  SpO2 94%  Intake/Output Summary (Last 24 hours) at 12/10/15 1240 Last data filed at 12/10/15 0400  Gross per 24 hour  Intake   2450 ml  Output      0 ml  Net   2450 ml   There were no vitals filed for this visit.  Exam:   General:   Alert, non verbal.   Cardiovascular: Regular rate and rhythm, S1-S2   Respiratory: Few rhonchi  Abdomen: Soft, obese, nontender, hypoactive bowel sounds   Musculoskeletal: Trace pitting edema bilaterally    Data Reviewed: Basic Metabolic Panel:  Recent Labs Lab 12/07/15 2049 12/08/15 0408 12/09/15 0554 12/09/15 1615 12/10/15 0340  NA 141  --  142  --  141  K 3.4*  --  2.6*  --  3.0*  CL 104  --  106  --  107  CO2 21*  --  23  --  24  GLUCOSE 149*  --  120*  --  130*  BUN 15  --  8  --  7  CREATININE 0.99  --  0.61  --  0.57*  CALCIUM 8.9  --  8.3*  --  8.0*  MG  --  1.7  --  2.0  --    Liver Function Tests:  Recent Labs Lab 12/07/15 2049 12/10/15 0340  AST 50* 42*  ALT 35 53  ALKPHOS 83 71  BILITOT 2.3* 3.7*  PROT 6.1* 5.0*  ALBUMIN 2.7* 2.0*   No results for  input(s): LIPASE, AMYLASE in the last 168 hours. No results for input(s): AMMONIA in the last 168 hours. CBC:  Recent Labs Lab 12/07/15 2049 12/08/15 1758 12/09/15 0554 12/10/15 0340  WBC 27.9* 20.2* 17.3* 15.4*  NEUTROABS 25.3*  --   --   --   HGB 16.3 15.2 14.4 14.0  HCT 47.9 44.1 42.5 40.7  MCV 95.4 94.0 94.4 92.9  PLT 164 149* 123* 124*   Cardiac Enzymes:   No results for input(s): CKTOTAL, CKMB, CKMBINDEX, TROPONINI in the last 168 hours. BNP (last 3 results)  Recent Labs  04/09/15 2133  BNP 118.4*    ProBNP (last 3 results) No results for input(s): PROBNP in the last 8760 hours.  CBG: No results for input(s): GLUCAP in the last  168 hours.  Recent Results (from the past 240 hour(s))  Culture, blood (routine x 2)     Status: None   Collection Time: 12/07/15  8:44 PM  Result Value Ref Range Status   Specimen Description BLOOD RIGHT ANTECUBITAL  Final   Special Requests BOTTLES DRAWN AEROBIC AND ANAEROBIC 5CC   Final   Culture  Setup Time   Final    GRAM NEGATIVE RODS IN BOTH AEROBIC AND ANAEROBIC BOTTLES CRITICAL RESULT CALLED TO, READ BACK BY AND VERIFIED WITH: Lydia Guiles 12/08/15 @ 1259 M VESTAL    Culture ENTEROBACTER CLOACAE KLEBSIELLA PNEUMONIAE   Final   Report Status 12/10/2015 FINAL  Final   Organism ID, Bacteria ENTEROBACTER CLOACAE  Final   Organism ID, Bacteria KLEBSIELLA PNEUMONIAE  Final      Susceptibility   Enterobacter cloacae - MIC*    CEFAZOLIN >=64 RESISTANT Resistant     CEFEPIME 2 SENSITIVE Sensitive     CEFTAZIDIME >=64 RESISTANT Resistant     CEFTRIAXONE >=64 RESISTANT Resistant     CIPROFLOXACIN <=0.25 SENSITIVE Sensitive     GENTAMICIN <=1 SENSITIVE Sensitive     IMIPENEM <=0.25 SENSITIVE Sensitive     TRIMETH/SULFA <=20 SENSITIVE Sensitive     PIP/TAZO >=128 RESISTANT Resistant     * ENTEROBACTER CLOACAE   Klebsiella pneumoniae - MIC*    AMPICILLIN 16 RESISTANT Resistant     CEFAZOLIN <=4 SENSITIVE Sensitive     CEFEPIME <=1 SENSITIVE Sensitive     CEFTAZIDIME <=1 SENSITIVE Sensitive     CEFTRIAXONE <=1 SENSITIVE Sensitive     CIPROFLOXACIN <=0.25 SENSITIVE Sensitive     GENTAMICIN <=1 SENSITIVE Sensitive     IMIPENEM <=0.25 SENSITIVE Sensitive     TRIMETH/SULFA <=20 SENSITIVE Sensitive     AMPICILLIN/SULBACTAM <=2 SENSITIVE Sensitive     PIP/TAZO <=4 SENSITIVE Sensitive     * KLEBSIELLA PNEUMONIAE  Culture, blood (routine x 2)     Status: None (Preliminary result)   Collection Time: 12/07/15  8:49 PM  Result Value Ref Range Status   Specimen Description BLOOD RIGHT ANTECUBITAL  Final   Special Requests BOTTLES DRAWN AEROBIC AND ANAEROBIC 5CC   Final   Culture  Setup  Time   Final    GRAM NEGATIVE RODS ANAEROBIC BOTTLE ONLY CRITICAL RESULT CALLED TO, READ BACK BY AND VERIFIED WITH: B. BLAKE,RN AT 3086 ON [id number] BY Lucienne Capers    Culture GRAM NEGATIVE RODS  Final   Report Status PENDING  Incomplete  Urine culture     Status: None   Collection Time: 12/07/15  9:18 PM  Result Value Ref Range Status   Specimen Description URINE, RANDOM  Final   Special Requests NONE  Final  Culture NO GROWTH 1 DAY  Final   Report Status 12/08/2015 FINAL  Final  MRSA PCR Screening     Status: None   Collection Time: 12/09/15 10:14 AM  Result Value Ref Range Status   MRSA by PCR NEGATIVE NEGATIVE Final    Comment:        The GeneXpert MRSA Assay (FDA approved for NASAL specimens only), is one component of a comprehensive MRSA colonization surveillance program. It is not intended to diagnose MRSA infection nor to guide or monitor treatment for MRSA infections.      Studies: Dg Swallowing Func-speech Pathology  12/10/2015  Objective Swallowing Evaluation: Type of Study: MBS-Modified Barium Swallow Study Patient Details Name: Jaaziel Peatross MRN: [id number] Date of Birth: 27-Sep-1931 Today's Date: 12/10/2015 Time: SLP Start Time (ACUTE ONLY): 0859-SLP Stop Time (ACUTE ONLY): 0919 SLP Time Calculation (min) (ACUTE ONLY): 20 min Past Medical History: Past Medical History Diagnosis Date . HLD (hyperlipidemia)  . Arthritis  . Anemia  . Depression  . Hip fracture (HCC)  . Stroke [facility])  Past Surgical History: Past Surgical History Procedure Laterality Date . Hernia repair   . Femur im nail Left 03/09/2013   Procedure: INTRAMEDULLARY (IM) NAIL FEMORAL;  Surgeon: Verlee Rossetti, MD;  Location: [facility] ORS;  Service: Orthopedics;  Laterality: Left; HPI: 82 year old male with past medical history of depression, stroke which has left him paraplegic who was brought in by wife on night of 2/12 after he had been confused and disoriented for several days as well as noted to be diaphoretic.  Dx  sepsis secondary to CAP, acute encephalopathy. Followed by SLP services June 2016 admission - concerns for choking incidents with dry solids; he was D/Cd on a dysphagia 3 diet with thin liquids.  Subjective: alert, looks at examiner but responds intermittently Assessment / Plan / Recommendation CHL IP CLINICAL IMPRESSIONS 12/10/2015 Therapy Diagnosis Mild pharyngeal phase dysphagia;Moderate pharyngeal phase dysphagia Clinical Impression Pt exhibited mild-mod pharyngeal phase dysphagia, characterized by delayed swallow initiation to the pyriform sinuses. This resulted in aspiration before the swallow during intake of thin liquids via cup with 5 second delayed cough . During intake of all other consistencies, oropharyngeal swallow appeared within functional limits. No mastication impairment or difficulty with straws observed, however due to pt's cognitive deficits regular texture and straws are not recommended as pt unlikely to sense aspiration. Recommend Dysphagia 3 (mechanical soft) diet, nectar thick liquids (no straws), meds whole in puree and full assist/supervision during meals. Pt educated re: diet recommendation. SLP will f/u to determine diet toleration. Impact on safety and function Moderate aspiration risk;Severe aspiration risk   CHL IP TREATMENT RECOMMENDATION 12/10/2015 Treatment Recommendations Therapy as outlined in treatment plan below   Prognosis 12/10/2015 Prognosis for Safe Diet Advancement Fair Barriers to Reach Goals Cognitive deficits;Severity of deficits Barriers/Prognosis Comment -- CHL IP DIET RECOMMENDATION 12/10/2015 SLP Diet Recommendations Dysphagia 3 (Mech soft) solids;Nectar thick liquid Liquid Administration via Cup;No straw Medication Administration Whole meds with puree Compensations Minimize environmental distractions;Slow rate;Small sips/bites Postural Changes Seated upright at 90 degrees   CHL IP OTHER RECOMMENDATIONS 12/10/2015 Recommended Consults -- Oral Care Recommendations Oral  care BID Other Recommendations Order thickener from pharmacy   CHL IP FOLLOW UP RECOMMENDATIONS 12/10/2015 Follow up Recommendations (No Data)   CHL IP FREQUENCY AND DURATION 12/10/2015 Speech Therapy Frequency (ACUTE ONLY) min 2x/week Treatment Duration 2 weeks      CHL IP ORAL PHASE 12/10/2015 Oral Phase WFL Oral - Pudding Teaspoon -- Oral - Pudding Cup --  Oral - Honey Teaspoon -- Oral - Honey Cup -- Oral - Nectar Teaspoon -- Oral - Nectar Cup -- Oral - Nectar Straw -- Oral - Thin Teaspoon -- Oral - Thin Cup -- Oral - Thin Straw -- Oral - Puree -- Oral - Mech Soft -- Oral - Regular -- Oral - Multi-Consistency -- Oral - Pill -- Oral Phase - Comment --  CHL IP PHARYNGEAL PHASE 12/10/2015 Pharyngeal Phase Impaired Pharyngeal- Pudding Teaspoon -- Pharyngeal -- Pharyngeal- Pudding Cup -- Pharyngeal -- Pharyngeal- Honey Teaspoon -- Pharyngeal -- Pharyngeal- Honey Cup -- Pharyngeal -- Pharyngeal- Nectar Teaspoon -- Pharyngeal -- Pharyngeal- Nectar Cup Delayed swallow initiation-vallecula Pharyngeal -- Pharyngeal- Nectar Straw WFL Pharyngeal -- Pharyngeal- Thin Teaspoon -- Pharyngeal -- Pharyngeal- Thin Cup Delayed swallow initiation-pyriform sinuses;Penetration/Aspiration before swallow;Moderate aspiration Pharyngeal Material enters airway, passes BELOW cords and not ejected out despite cough attempt by patient Pharyngeal- Thin Straw -- Pharyngeal -- Pharyngeal- Puree WFL Pharyngeal -- Pharyngeal- Mechanical Soft -- Pharyngeal -- Pharyngeal- Regular -- Pharyngeal -- Pharyngeal- Multi-consistency -- Pharyngeal -- Pharyngeal- Pill -- Pharyngeal -- Pharyngeal Comment --  CHL IP CERVICAL ESOPHAGEAL PHASE 12/10/2015 Cervical Esophageal Phase WFL Pudding Teaspoon -- Pudding Cup -- Honey Teaspoon -- Honey Cup -- Nectar Teaspoon -- Nectar Cup -- Nectar Straw -- Thin Teaspoon -- Thin Cup -- Thin Straw -- Puree -- Mechanical Soft -- Regular -- Multi-consistency -- Pill -- Cervical Esophageal Comment -- No flowsheet data found. Royce Macadamia 12/10/2015, 10:31 AM  Breck Coons Lonell Face.Ed CCC-SLP Pager [phone]              Scheduled Meds: . antiseptic oral rinse  7 mL Mouth Rinse q12n4p  . chlorhexidine  15 mL Mouth Rinse BID  . ciprofloxacin  400 mg Intravenous Q12H  . [START ON 12/11/2015] ciprofloxacin  500 mg Oral BID  . enoxaparin (LOVENOX) injection  40 mg Subcutaneous Q24H  . ipratropium-albuterol  3 mL Nebulization QID    Continuous Infusions: . sodium chloride 100 mL/hr at 12/10/15 6962     Time spent: 25 minutes  Riggs Dineen A  [facility] Pager [phone] . If 7PM-7AM, please contact night-coverage at [email or web address], password [facility] 12/10/2015, 12:40 PM  LOS: 2 days

## 2015-12-10 NOTE — Progress Notes (Signed)
Speech Language Pathology Dysphagia Treatment Patient Details Name: Howard Rhodes MRN: [id number] DOB: 04-26-1931 Today's Date: 12/10/2015 Time: 0811-0825 SLP Time Calculation (min) (ACUTE ONLY): 14 min  Assessment / Plan / Recommendation Clinical Impression    Pt presented with fluctuating alertness throughout session and required max verbal, tactile and visual cues to attend. Delayed coughing, watery eyes and oral holding exhibited during intake of thin liquids and purees. Pt educated re: need for instrumental testing. MBSS to be completed this am to determine if s/s are due to penetration/aspiration and to advise diet recommendation. D/w RN.      Diet Recommendation    TBD pending results of MBSS   SLP Plan MBS;New goals to be determined pending instrumental study      Swallowing Goals     General Behavior/Cognition: Cooperative;Confused;Lethargic/Drowsy;Requires cueing Patient Positioning: Upright in bed Oral care provided: Yes HPI: 82 year old male with past medical history of depression, stroke which has left him paraplegic who was brought in by wife on night of 2/12 after he had been confused and disoriented for several days as well as noted to be diaphoretic.  Dx sepsis secondary to CAP, acute encephalopathy. Followed by SLP services June 2016 admission - concerns for choking incidents with dry solids; he was D/Cd on a dysphagia 3 diet with thin liquids.   Oral Cavity - Oral Hygiene     Dysphagia Treatment Treatment Methods: Skilled observation;Differential diagnosis;Upgraded PO texture trial Patient observed directly with PO's: Yes Type of PO's observed: Thin liquids;Dysphagia 1 (puree) Feeding: Total assist Liquids provided via: Cup;Straw Oral Phase Signs & Symptoms: Oral holding Pharyngeal Phase Signs & Symptoms: Multiple swallows;Delayed cough;Watery eyes Type of cueing: Verbal;Tactile;Visual Amount of cueing: Maximal   GO     Howard Rhodes 12/10/2015, 8:55  AM    Lynita Lombard, Student-SLP

## 2015-12-11 LAB — COMPREHENSIVE METABOLIC PANEL
ALK PHOS: 80 U/L (ref 38–126)
ALT: 46 U/L (ref 17–63)
ANION GAP: 10 (ref 5–15)
AST: 37 U/L (ref 15–41)
Albumin: 1.8 g/dL — ABNORMAL LOW (ref 3.5–5.0)
BILIRUBIN TOTAL: 3.1 mg/dL — AB (ref 0.3–1.2)
BUN: 8 mg/dL (ref 6–20)
CALCIUM: 8 mg/dL — AB (ref 8.9–10.3)
CO2: 24 mmol/L (ref 22–32)
Chloride: 108 mmol/L (ref 101–111)
Creatinine, Ser: 0.59 mg/dL — ABNORMAL LOW (ref 0.61–1.24)
GFR calc Af Amer: >60 mL/min (ref >60)
GLUCOSE: 147 mg/dL — AB (ref 65–99)
POTASSIUM: 3.3 mmol/L — AB (ref 3.5–5.1)
Sodium: 142 mmol/L (ref 135–145)
TOTAL PROTEIN: 5 g/dL — AB (ref 6.5–8.1)

## 2015-12-11 LAB — CBC
HEMATOCRIT: 37.8 % — AB (ref 39.0–52.0)
HEMOGLOBIN: 12.9 g/dL — AB (ref 13.0–17.0)
MCH: 31.9 pg (ref 26.0–34.0)
MCHC: 34.1 g/dL (ref 30.0–36.0)
MCV: 93.3 fL (ref 78.0–100.0)
Platelets: 126 10*3/uL — ABNORMAL LOW (ref 150–400)
RBC: 4.05 MIL/uL — ABNORMAL LOW (ref 4.22–5.81)
RDW: 15.1 % (ref 11.5–15.5)
WBC: 14.8 10*3/uL — ABNORMAL HIGH (ref 4.0–10.5)

## 2015-12-11 LAB — AMMONIA: AMMONIA: 56 umol/L — AB (ref 9–35)

## 2015-12-11 MED ORDER — LACTULOSE 10 GM/15ML PO SOLN
20.0000 g | Freq: Three times a day (TID) | ORAL | Status: DC
  Administered 2015-12-11 – 2015-12-12 (×2): 20 g via ORAL
  Filled 2015-12-11 (×2): qty 30

## 2015-12-11 MED ORDER — POTASSIUM CHLORIDE CRYS ER 20 MEQ PO TBCR
40.0000 meq | EXTENDED_RELEASE_TABLET | ORAL | Status: AC
Start: 2015-12-11 — End: 2015-12-11
  Administered 2015-12-11 (×2): 40 meq via ORAL
  Filled 2015-12-11 (×3): qty 2

## 2015-12-11 MED ORDER — CIPROFLOXACIN IN D5W 400 MG/200ML IV SOLN
400.0000 mg | Freq: Two times a day (BID) | INTRAVENOUS | Status: DC
  Administered 2015-12-11 – 2015-12-19 (×16): 400 mg via INTRAVENOUS
  Filled 2015-12-11 (×18): qty 200

## 2015-12-11 NOTE — Progress Notes (Signed)
PROGRESS NOTE  Howard Rhodes EPP:[id number] DOB: 18-Feb-1931 DOA: 12/07/2015 PCP: Ailene Ravel, MD  HPI/Recap of past 24 hours: Patient is an 82 year old male with past medical history of depression, stroke which has left him paraplegic who was brought in by wife on night of 2/12 after he had been confused and disoriented times several days as well as noted to be diaphoretic. The emergency room, patient can have white count of 28, lactic acid level 6.24, febrile and hypotensive which initially required fluids and then briefly pressors. Chest x-ray noted multi lobar pneumonia.   Patient admitted to the hospital service for septic shock. Prior to leaving the emergency room, able to be weaned off pressor support and maintain stable blood pressures.  Follow-up lactic acid level down to 2.  2/2 bottles of blood cultures growing out gram-negative rods. Urinalysis negative  Alert but no very responsive  Assessment/Plan:  Gram-negative Septic shock (HCC), klebsiella, enteobacter Bacteremia  Patient meets criteria for septic shock on admission given hypotension persisting despite fluid support and briefly requiring pressor support, lactic acidosis, marked leukocytosis, tachypnea and tachycardia with pulmonary source noted on chest x-ray. Change Antibiotics to cipro, both organism sensitive to cipro.  Will need repeat Blood culture. Will order repeat blood culture 2-17.  Multilobar, broncho-PNA Continue with antibiotics.   Encephalopathy; ammonia level elevated at 60. Continue with lactulose, increase to TID.  Suspect related to acute illness .   Hyperbilirubinemia: Significant acute illness. [place] with possible liver cyst unable to rule out mass. Will order CT abdomen   Dysphagia; on dysphagia 3 diet nectar thick.   Stroke (cerebrum) [facility]): At baseline Hypokalemia:Replacing. Mg normal   Code Status: DNR  Family Communication: daughter in law [name] who share power of attorney with  patient 's son.   Disposition Plan: Likely here for several days    Consultants:  None   Procedures:  None   Antibiotics:  IV Zosyn and vancomycin 2/13-present --- cipro    Objective: BP 109/42 mmHg  Pulse 109  Temp(Src) 99.7 F (37.6 C) (Oral)  Resp 18  SpO2 96%  Intake/Output Summary (Last 24 hours) at 12/11/15 1642 Last data filed at 12/11/15 1240  Gross per 24 hour  Intake    480 ml  Output      0 ml  Net    480 ml   There were no vitals filed for this visit.  Exam:   General:   Alert, non verbal.   Cardiovascular: Regular rate and rhythm, S1-S2   Respiratory: Few rhonchi  Abdomen: Soft, obese, nontender, hypoactive bowel sounds   Musculoskeletal: Trace pitting edema bilaterally    Data Reviewed: Basic Metabolic Panel:  Recent Labs Lab 12/07/15 2049 12/08/15 0408 12/09/15 0554 12/09/15 1615 12/10/15 0340 12/11/15 0350  NA 141  --  142  --  141 142  K 3.4*  --  2.6*  --  3.0* 3.3*  CL 104  --  106  --  107 108  CO2 21*  --  23  --  24 24  GLUCOSE 149*  --  120*  --  130* 147*  BUN 15  --  8  --  7 8  CREATININE 0.99  --  0.61  --  0.57* 0.59*  CALCIUM 8.9  --  8.3*  --  8.0* 8.0*  MG  --  1.7  --  2.0  --   --    Liver Function Tests:  Recent Labs Lab 12/07/15 2049 12/10/15 0340 12/11/15  0350  AST 50* 42* 37  ALT 35 53 46  ALKPHOS 83 71 80  BILITOT 2.3* 3.7* 3.1*  PROT 6.1* 5.0* 5.0*  ALBUMIN 2.7* 2.0* 1.8*   No results for input(s): LIPASE, AMYLASE in the last 168 hours.  Recent Labs Lab 12/10/15 1542 12/11/15 0350  AMMONIA 62* 56*   CBC:  Recent Labs Lab 12/07/15 2049 12/08/15 1758 12/09/15 0554 12/10/15 0340 12/11/15 0350  WBC 27.9* 20.2* 17.3* 15.4* 14.8*  NEUTROABS 25.3*  --   --   --   --   HGB 16.3 15.2 14.4 14.0 12.9*  HCT 47.9 44.1 42.5 40.7 37.8*  MCV 95.4 94.0 94.4 92.9 93.3  PLT 164 149* 123* 124* 126*   Cardiac Enzymes:   No results for input(s): CKTOTAL, CKMB, CKMBINDEX, TROPONINI in the last  168 hours. BNP (last 3 results)  Recent Labs  04/09/15 2133  BNP 118.4*    ProBNP (last 3 results) No results for input(s): PROBNP in the last 8760 hours.  CBG: No results for input(s): GLUCAP in the last 168 hours.  Recent Results (from the past 240 hour(s))  Culture, blood (routine x 2)     Status: None   Collection Time: 12/07/15  8:44 PM  Result Value Ref Range Status   Specimen Description BLOOD RIGHT ANTECUBITAL  Final   Special Requests BOTTLES DRAWN AEROBIC AND ANAEROBIC 5CC   Final   Culture  Setup Time   Final    GRAM NEGATIVE RODS IN BOTH AEROBIC AND ANAEROBIC BOTTLES CRITICAL RESULT CALLED TO, READ BACK BY AND VERIFIED WITH: Lydia Guiles 12/08/15 @ 1259 M VESTAL    Culture ENTEROBACTER CLOACAE KLEBSIELLA PNEUMONIAE   Final   Report Status 12/10/2015 FINAL  Final   Organism ID, Bacteria ENTEROBACTER CLOACAE  Final   Organism ID, Bacteria KLEBSIELLA PNEUMONIAE  Final      Susceptibility   Enterobacter cloacae - MIC*    CEFAZOLIN >=64 RESISTANT Resistant     CEFEPIME 2 SENSITIVE Sensitive     CEFTAZIDIME >=64 RESISTANT Resistant     CEFTRIAXONE >=64 RESISTANT Resistant     CIPROFLOXACIN <=0.25 SENSITIVE Sensitive     GENTAMICIN <=1 SENSITIVE Sensitive     IMIPENEM <=0.25 SENSITIVE Sensitive     TRIMETH/SULFA <=20 SENSITIVE Sensitive     PIP/TAZO >=128 RESISTANT Resistant     * ENTEROBACTER CLOACAE   Klebsiella pneumoniae - MIC*    AMPICILLIN 16 RESISTANT Resistant     CEFAZOLIN <=4 SENSITIVE Sensitive     CEFEPIME <=1 SENSITIVE Sensitive     CEFTAZIDIME <=1 SENSITIVE Sensitive     CEFTRIAXONE <=1 SENSITIVE Sensitive     CIPROFLOXACIN <=0.25 SENSITIVE Sensitive     GENTAMICIN <=1 SENSITIVE Sensitive     IMIPENEM <=0.25 SENSITIVE Sensitive     TRIMETH/SULFA <=20 SENSITIVE Sensitive     AMPICILLIN/SULBACTAM <=2 SENSITIVE Sensitive     PIP/TAZO <=4 SENSITIVE Sensitive     * KLEBSIELLA PNEUMONIAE  Culture, blood (routine x 2)     Status: None (Preliminary  result)   Collection Time: 12/07/15  8:49 PM  Result Value Ref Range Status   Specimen Description BLOOD RIGHT ANTECUBITAL  Final   Special Requests BOTTLES DRAWN AEROBIC AND ANAEROBIC 5CC   Final   Culture  Setup Time   Final    GRAM NEGATIVE RODS ANAEROBIC BOTTLE ONLY CRITICAL RESULT CALLED TO, READ BACK BY AND VERIFIED WITH: B. BLAKE,RN AT 1324 ON [id number] BY Lucienne Capers    Culture  Final    KLEBSIELLA PNEUMONIAE SUSCEPTIBILITIES PERFORMED ON PREVIOUS CULTURE WITHIN THE LAST 5 DAYS. GRAM NEGATIVE RODS IDENTIFICATION TO FOLLOW    Report Status PENDING  Incomplete  Urine culture     Status: None   Collection Time: 12/07/15  9:18 PM  Result Value Ref Range Status   Specimen Description URINE, RANDOM  Final   Special Requests NONE  Final   Culture NO GROWTH 1 DAY  Final   Report Status 12/08/2015 FINAL  Final  MRSA PCR Screening     Status: None   Collection Time: 12/09/15 10:14 AM  Result Value Ref Range Status   MRSA by PCR NEGATIVE NEGATIVE Final    Comment:        The GeneXpert MRSA Assay (FDA approved for NASAL specimens only), is one component of a comprehensive MRSA colonization surveillance program. It is not intended to diagnose MRSA infection nor to guide or monitor treatment for MRSA infections.      Studies: US Abdomen Limited Ruq  01/03/2016  CLINICAL DATA:  Abnormal LFTs. EXAM: US ABDOMEN LIMITED - RIGHT UPPER QUADRANT COMPARISON:  04/13/2015 FINDINGS: Gallbladder: Layering sludge noted in the lumen of the gallbladder. No gallbladder wall thickening or pericholecystic fluid. Sonographer reports no sonographic Murphy sign. Common bile duct: Diameter: 6 mm Liver: 4.5 by 4.3 x 4.8 cm complex cystic lesion identified in the inferior right liver, adjacent to the gallbladder. This was not visualized on the prior study. IMPRESSION: Complex cystic and solid lesion in the inferior right liver adjacent to the gallbladder. This lesion will require further imaging  characterization as neoplasm could have this appearance. Consider CT imaging without and with contrast. Electronically Signed   By: Kennith Center M.D.   On: 01-03-2016 19:50    Scheduled Meds: . antiseptic oral rinse  7 mL Mouth Rinse q12n4p  . chlorhexidine  15 mL Mouth Rinse BID  . ciprofloxacin  500 mg Oral BID  . enoxaparin (LOVENOX) injection  40 mg Subcutaneous Q24H  . ipratropium-albuterol  3 mL Nebulization QID  . lactulose  20 g Oral BID    Continuous Infusions: . sodium chloride 100 mL/hr at 12/11/15 1347     Time spent: 25 minutes  Alany Borman A  [facility] Pager [phone] . If 7PM-7AM, please contact night-coverage at [email or web address], password [facility] 12/11/2015, 4:42 PM  LOS: 3 days

## 2015-12-11 NOTE — Progress Notes (Signed)
Utilization review completed.  

## 2015-12-12 ENCOUNTER — Inpatient Hospital Stay (HOSPITAL_COMMUNITY): Payer: Medicare Other

## 2015-12-12 ENCOUNTER — Encounter (HOSPITAL_COMMUNITY): Payer: Self-pay | Admitting: Radiology

## 2015-12-12 LAB — COMPREHENSIVE METABOLIC PANEL
ALT: 52 U/L (ref 17–63)
AST: 58 U/L — AB (ref 15–41)
Albumin: 1.8 g/dL — ABNORMAL LOW (ref 3.5–5.0)
Alkaline Phosphatase: 90 U/L (ref 38–126)
Anion gap: 11 (ref 5–15)
BUN: 8 mg/dL (ref 6–20)
CHLORIDE: 110 mmol/L (ref 101–111)
CO2: 20 mmol/L — AB (ref 22–32)
CREATININE: 0.53 mg/dL — AB (ref 0.61–1.24)
Calcium: 8.2 mg/dL — ABNORMAL LOW (ref 8.9–10.3)
GFR calc Af Amer: >60 mL/min (ref >60)
GFR calc non Af Amer: >60 mL/min (ref >60)
Glucose, Bld: 149 mg/dL — ABNORMAL HIGH (ref 65–99)
Potassium: 4.3 mmol/L (ref 3.5–5.1)
SODIUM: 141 mmol/L (ref 135–145)
Total Bilirubin: 2.6 mg/dL — ABNORMAL HIGH (ref 0.3–1.2)
Total Protein: 5.1 g/dL — ABNORMAL LOW (ref 6.5–8.1)

## 2015-12-12 LAB — AMMONIA: AMMONIA: 65 umol/L — AB (ref 9–35)

## 2015-12-12 LAB — CULTURE, BLOOD (ROUTINE X 2)

## 2015-12-12 MED ORDER — IOHEXOL 300 MG/ML  SOLN
100.0000 mL | Freq: Once | INTRAMUSCULAR | Status: AC | PRN
  Administered 2015-12-12: 100 mL via INTRAVENOUS

## 2015-12-12 MED ORDER — SODIUM CHLORIDE 0.9 % IV SOLN
INTRAVENOUS | Status: DC
  Administered 2015-12-12: 18:00:00 via INTRAVENOUS

## 2015-12-12 MED ORDER — LACTULOSE 10 GM/15ML PO SOLN
30.0000 g | Freq: Three times a day (TID) | ORAL | Status: DC
  Administered 2015-12-13 – 2015-12-16 (×8): 30 g via ORAL
  Filled 2015-12-12 (×13): qty 45

## 2015-12-12 NOTE — Progress Notes (Addendum)
PROGRESS NOTE  Howard Rhodes WGN:[id number] DOB: Mar 07, 1931 DOA: 12/07/2015 PCP: Ailene Ravel, MD  HPI/Recap of past 24 hours: Patient is an 82 year old male with past medical history of depression, stroke which has left him paraplegic who was brought in by wife on night of 2/12 after he had been confused and disoriented times several days as well as noted to be diaphoretic. The emergency room, patient can have white count of 28, lactic acid level 6.24, febrile and hypotensive which initially required fluids and then briefly pressors. Chest x-ray noted multi lobar pneumonia.   Patient admitted to the hospital service for septic shock. Prior to leaving the emergency room, able to be weaned off pressor support and maintain stable blood pressures.  Follow-up lactic acid level down to 2.  2/2 bottles of blood cultures growing out gram-negative rods. Urinalysis negative  Patient alert , no responsive.   Assessment/Plan:  Gram-negative Septic shock (HCC), klebsiella, enteobacter Bacteremia  Patient meets criteria for septic shock on admission given hypotension persisting despite fluid support and briefly requiring pressor support, lactic acidosis, marked leukocytosis, tachypnea and tachycardia with pulmonary source noted on chest x-ray. Change Antibiotics to cipro, both organism sensitive to cipro.  Follow  Repeated Blood culture drawn 2-17.  RUQ [place] complex cyst in the liver, adjacent to gall bladder. Will get CT abdomen to better evaluate.  Will continue with IV ciprofloxacin.  -CT consistent with 2 liver abscess. Discuss with Radiologist, who recommend CT pelvis to rule out diverticulitis as source, if negative will need to consider the gallbladder as source. Will consult IR for evaluation of liver abscess drainage. Will also consult GI.  Please call POA for consent.   Multilobar, broncho-PNA Continue with cipro.   Encephalopathy;  ammonia level elevated .increase lactulose  Suspect related  to acute illness   Hyperbilirubinemia: Significant acute illness. Elevated, check ammonia level. US showed complex cyst liver. Will order CT abdomen.  Bili trending down.   Dysphagia; on dysphagia 3 diet nectar thick.  Stroke (cerebrum) [facility]): At baseline Hypokalemia:Replacing. Mg normal   Code Status: DNR  Family Communication: no family at bedsi.   Disposition Plan: Likely here for several days    Consultants:  None   Procedures:  None   Antibiotics:  IV Zosyn and vancomycin 2/13-present --- cipro    Objective: BP 122/67 mmHg  Pulse 100  Temp(Src) 99.6 F (37.6 C) (Oral)  Resp 21  Wt 86.637 kg (191 lb)  SpO2 96%  Intake/Output Summary (Last 24 hours) at 12/12/15 1639 Last data filed at 12/12/15 0900  Gross per 24 hour  Intake    240 ml  Output      0 ml  Net    240 ml   Filed Weights   12/12/15 1305  Weight: 86.637 kg (191 lb)    Exam:   General:   Alert, non verbal.   Cardiovascular: Regular rate and rhythm, S1-S2   Respiratory: Few rhonchi  Abdomen: Soft, obese, nontender, hypoactive bowel sounds   Musculoskeletal: Trace pitting edema bilaterally    Data Reviewed: Basic Metabolic Panel:  Recent Labs Lab 12/07/15 2049 12/08/15 0408 12/09/15 0554 12/09/15 1615 12/10/15 0340 12/11/15 0350 12/12/15 0117  NA 141  --  142  --  141 142 141  K 3.4*  --  2.6*  --  3.0* 3.3* 4.3  CL 104  --  106  --  107 108 110  CO2 21*  --  23  --  24 24 20*  GLUCOSE  149*  --  120*  --  130* 147* 149*  BUN 15  --  8  --  CREATININE 0.99  --  0.61  --  0.57* 0.59* 0.53*  CALCIUM 8.9  --  8.3*  --  8.0* 8.0* 8.2*  MG  --  1.7  --  2.0  --   --   --    Liver Function Tests:  Recent Labs Lab 12/07/15 2049 12/10/15 0340 12/11/15 0350 12/12/15 0117  AST 50* 42* 37 58*  ALT 35 53 46 52  ALKPHOS 83 71 80 90  BILITOT 2.3* 3.7* 3.1* 2.6*  PROT 6.1* 5.0* 5.0* 5.1*  ALBUMIN 2.7* 2.0* 1.8* 1.8*   No results for input(s): LIPASE, AMYLASE in the  last 168 hours.  Recent Labs Lab 12/10/15 1542 12/11/15 0350 12/12/15 0121  AMMONIA 62* 56* 65*   CBC:  Recent Labs Lab 12/07/15 2049 12/08/15 1758 12/09/15 0554 12/10/15 0340 12/11/15 0350  WBC 27.9* 20.2* 17.3* 15.4* 14.8*  NEUTROABS 25.3*  --   --   --   --   HGB 16.3 15.2 14.4 14.0 12.9*  HCT 47.9 44.1 42.5 40.7 37.8*  MCV 95.4 94.0 94.4 92.9 93.3  PLT 164 149* 123* 124* 126*   Cardiac Enzymes:   No results for input(s): CKTOTAL, CKMB, CKMBINDEX, TROPONINI in the last 168 hours. BNP (last 3 results)  Recent Labs  04/09/15 2133  BNP 118.4*    ProBNP (last 3 results) No results for input(s): PROBNP in the last 8760 hours.  CBG: No results for input(s): GLUCAP in the last 168 hours.  Recent Results (from the past 240 hour(s))  Culture, blood (routine x 2)     Status: None   Collection Time: 12/07/15  8:44 PM  Result Value Ref Range Status   Specimen Description BLOOD RIGHT ANTECUBITAL  Final   Special Requests BOTTLES DRAWN AEROBIC AND ANAEROBIC 5CC   Final   Culture  Setup Time   Final    GRAM NEGATIVE RODS IN BOTH AEROBIC AND ANAEROBIC BOTTLES CRITICAL RESULT CALLED TO, READ BACK BY AND VERIFIED WITH: Lydia Guiles 12/08/15 @ 1259 M VESTAL    Culture ENTEROBACTER CLOACAE KLEBSIELLA PNEUMONIAE   Final   Report Status 12/10/2015 FINAL  Final   Organism ID, Bacteria ENTEROBACTER CLOACAE  Final   Organism ID, Bacteria KLEBSIELLA PNEUMONIAE  Final      Susceptibility   Enterobacter cloacae - MIC*    CEFAZOLIN >=64 RESISTANT Resistant     CEFEPIME 2 SENSITIVE Sensitive     CEFTAZIDIME >=64 RESISTANT Resistant     CEFTRIAXONE >=64 RESISTANT Resistant     CIPROFLOXACIN <=0.25 SENSITIVE Sensitive     GENTAMICIN <=1 SENSITIVE Sensitive     IMIPENEM <=0.25 SENSITIVE Sensitive     TRIMETH/SULFA <=20 SENSITIVE Sensitive     PIP/TAZO >=128 RESISTANT Resistant     * ENTEROBACTER CLOACAE   Klebsiella pneumoniae - MIC*    AMPICILLIN 16 RESISTANT Resistant      CEFAZOLIN <=4 SENSITIVE Sensitive     CEFEPIME <=1 SENSITIVE Sensitive     CEFTAZIDIME <=1 SENSITIVE Sensitive     CEFTRIAXONE <=1 SENSITIVE Sensitive     CIPROFLOXACIN <=0.25 SENSITIVE Sensitive     GENTAMICIN <=1 SENSITIVE Sensitive     IMIPENEM <=0.25 SENSITIVE Sensitive     TRIMETH/SULFA <=20 SENSITIVE Sensitive     AMPICILLIN/SULBACTAM <=2 SENSITIVE Sensitive     PIP/TAZO <=4 SENSITIVE Sensitive     * KLEBSIELLA  PNEUMONIAE  Culture, blood (routine x 2)     Status: None   Collection Time: 12/07/15  8:49 PM  Result Value Ref Range Status   Specimen Description BLOOD RIGHT ANTECUBITAL  Final   Special Requests BOTTLES DRAWN AEROBIC AND ANAEROBIC 5CC   Final   Culture  Setup Time   Final    GRAM NEGATIVE RODS ANAEROBIC BOTTLE ONLY CRITICAL RESULT CALLED TO, READ BACK BY AND VERIFIED WITH: B. BLAKE,RN AT 0909 ON [id number] BY Lucienne Capers    Culture   Final    KLEBSIELLA PNEUMONIAE SUSCEPTIBILITIES PERFORMED ON PREVIOUS CULTURE WITHIN THE LAST 5 DAYS.    Report Status 12/12/2015 FINAL  Final  Urine culture     Status: None   Collection Time: 12/07/15  9:18 PM  Result Value Ref Range Status   Specimen Description URINE, RANDOM  Final   Special Requests NONE  Final   Culture NO GROWTH 1 DAY  Final   Report Status 12/08/2015 FINAL  Final  MRSA PCR Screening     Status: None   Collection Time: 12/09/15 10:14 AM  Result Value Ref Range Status   MRSA by PCR NEGATIVE NEGATIVE Final    Comment:        The GeneXpert MRSA Assay (FDA approved for NASAL specimens only), is one component of a comprehensive MRSA colonization surveillance program. It is not intended to diagnose MRSA infection nor to guide or monitor treatment for MRSA infections.      Studies: No results found.  Scheduled Meds: . antiseptic oral rinse  7 mL Mouth Rinse q12n4p  . chlorhexidine  15 mL Mouth Rinse BID  . ciprofloxacin  400 mg Intravenous Q12H  . enoxaparin (LOVENOX) injection  40 mg Subcutaneous  Q24H  . lactulose  20 g Oral TID    Continuous Infusions: . sodium chloride 75 mL/hr at 12/12/15 0825     Time spent: 25 minutes  Jontae Adebayo A  [facility] Pager [phone] . If 7PM-7AM, please contact night-coverage at [email or web address], password [facility] 12/12/2015, 4:39 PM  LOS: 4 days

## 2015-12-12 NOTE — Progress Notes (Signed)
Nutrition Brief Note  Patient identified on the [place] Report.  Wt Readings from Last 15 Encounters:  12/12/15 191 lb (86.637 kg)  04/18/15 191 lb 9.3 oz (86.9 kg)  04/16/13 195 lb 5.2 oz (88.6 kg)  03/08/13 220 lb (99.791 kg)    Body mass index is 32.77 kg/(m^2). Patient meets criteria for Obesity Class I based on current BMI.   Current diet order is Dysphagia 3, nectar thick liquids, patient is consuming approximately 100% of meals at this time.  Labs and medications reviewed.  No skin breakdown identified.  No nutrition interventions warranted at this time. If nutrition issues arise, please consult RD.   Maureen Chatters, RD, LDN Pager #: [phone] After-Hours Pager #: [phone]

## 2015-12-12 NOTE — Progress Notes (Signed)
PT Cancellation Note  Patient Details Name: Howard Rhodes MRN: [id number] DOB: Dec 25, 1930   Cancelled Treatment:    Reason Eval/Treat Not Completed: PT screened, no needs identified, will sign off.  Pt was just evaluated by PT and no needs identified as pt was a dependent pt at home.  Will not pick up for active therapy as he is at baseline.   Ivar Drape 12/12/2015, 7:48 AM   Samul Dada, PT MS Acute Rehab Dept. Number: [facility] [id number] and [facility] [phone]

## 2015-12-12 NOTE — Progress Notes (Signed)
Speech Language Pathology Dysphagia Treatment Patient Details Name: Howard Rhodes MRN: [id number] DOB: July 18, 1931 Today's Date: 12/12/2015 Time: 4098-1191 SLP Time Calculation (min) (ACUTE ONLY): 18 min  Assessment / Plan / Recommendation Clinical Impression    Pt exhibited multiple swallows after sips of nectar thick liquids, possibly indicative of pharyngeal residue, however no s/s of penetration/aspiration observed. SLP provided max verbal, tactile and visual cues to follow directions given due to pt's baseline cognitive deficits. Pt educated re: diet recommendation. Recommend Dysphagia 3 (mechanical soft) diet, nectar thick liquids, meds whole in puree and full assist/supervision. SLP will f/u to monitor diet.    Diet Recommendation    Dysphagia 3 diet, nectar thick liquids, meds whole in puree   SLP Plan Continue with current plan of care      Swallowing Goals     General Behavior/Cognition: Cooperative;Confused;Lethargic/Drowsy;Requires cueing;Alert Patient Positioning: Upright in bed Oral care provided: N/A HPI: 82 year old male with past medical history of depression, stroke which has left him paraplegic who was brought in by wife on night of 2/12 after he had been confused and disoriented for several days as well as noted to be diaphoretic.  Dx sepsis secondary to CAP, acute encephalopathy. Followed by SLP services June 2016 admission - concerns for choking incidents with dry solids; he was D/Cd on a dysphagia 3 diet with thin liquids.   Oral Cavity - Oral Hygiene     Dysphagia Treatment Treatment Methods: Skilled observation;Patient/caregiver education Patient observed directly with PO's: Yes Type of PO's observed: Nectar-thick liquids;Dysphagia 3 (soft) Feeding: Total assist Liquids provided via: Cup;No straw Pharyngeal Phase Signs & Symptoms: Multiple swallows Type of cueing: Verbal;Visual;Tactile Amount of cueing: Maximal   GO     Howard Rhodes 12/12/2015, 12:22  PM  Howard Rhodes, Student-SLP

## 2015-12-12 NOTE — Progress Notes (Signed)
OT Cancellation Note  Patient Details Name: Howard Rhodes MRN: [id number] DOB: 11-06-1930   Cancelled Treatment:    Reason Eval/Treat Not Completed: OT screened, no needs identified, will sign off. Pt with baseline dependence in ADL and mobility.  If family is no longer able to care for pt at home, will need SNF.  Evern Bio 12/12/2015, 8:38 AM  [phone]

## 2015-12-12 NOTE — Progress Notes (Addendum)
MD Steffanie Dunn made this writer aware that Consent for procedures for this pt is to be obtained from daughter in law Mason, [place] ... Tarri Glenn, RN

## 2015-12-13 ENCOUNTER — Inpatient Hospital Stay (HOSPITAL_COMMUNITY): Payer: Medicare Other

## 2015-12-13 DIAGNOSIS — K750 Abscess of liver: Secondary | ICD-10-CM

## 2015-12-13 DIAGNOSIS — R7881 Bacteremia: Secondary | ICD-10-CM

## 2015-12-13 DIAGNOSIS — R740 Nonspecific elevation of levels of transaminase and lactic acid dehydrogenase [LDH]: Secondary | ICD-10-CM

## 2015-12-13 DIAGNOSIS — R932 Abnormal findings on diagnostic imaging of liver and biliary tract: Secondary | ICD-10-CM

## 2015-12-13 LAB — BASIC METABOLIC PANEL
ANION GAP: 9 (ref 5–15)
BUN: 9 mg/dL (ref 6–20)
CHLORIDE: 108 mmol/L (ref 101–111)
CO2: 21 mmol/L — AB (ref 22–32)
Calcium: 7.9 mg/dL — ABNORMAL LOW (ref 8.9–10.3)
Creatinine, Ser: 0.51 mg/dL — ABNORMAL LOW (ref 0.61–1.24)
GFR calc non Af Amer: >60 mL/min (ref >60)
Glucose, Bld: 98 mg/dL (ref 65–99)
Potassium: 3.5 mmol/L (ref 3.5–5.1)
Sodium: 138 mmol/L (ref 135–145)

## 2015-12-13 LAB — CBC
HCT: 37 % — ABNORMAL LOW (ref 39.0–52.0)
Hemoglobin: 12.4 g/dL — ABNORMAL LOW (ref 13.0–17.0)
MCH: 31.6 pg (ref 26.0–34.0)
MCHC: 33.5 g/dL (ref 30.0–36.0)
MCV: 94.1 fL (ref 78.0–100.0)
Platelets: 215 10*3/uL (ref 150–400)
RBC: 3.93 MIL/uL — AB (ref 4.22–5.81)
RDW: 15.5 % (ref 11.5–15.5)
WBC: 13.7 10*3/uL — ABNORMAL HIGH (ref 4.0–10.5)

## 2015-12-13 LAB — GLUCOSE, CAPILLARY: Glucose-Capillary: 88 mg/dL (ref 65–99)

## 2015-12-13 MED ORDER — IOHEXOL 300 MG/ML  SOLN
50.0000 mL | Freq: Once | INTRAMUSCULAR | Status: AC | PRN
  Administered 2015-12-13: 5 mL via INTRAVENOUS

## 2015-12-13 MED ORDER — MIDAZOLAM HCL 2 MG/2ML IJ SOLN
INTRAMUSCULAR | Status: AC | PRN
  Administered 2015-12-13 (×2): 0.5 mg via INTRAVENOUS

## 2015-12-13 MED ORDER — MIDAZOLAM HCL 2 MG/2ML IJ SOLN
INTRAMUSCULAR | Status: AC
  Filled 2015-12-13: qty 2

## 2015-12-13 MED ORDER — FENTANYL CITRATE (PF) 100 MCG/2ML IJ SOLN
INTRAMUSCULAR | Status: AC | PRN
  Administered 2015-12-13 (×2): 25 ug via INTRAVENOUS

## 2015-12-13 MED ORDER — LIDOCAINE HCL 1 % IJ SOLN
INTRAMUSCULAR | Status: AC
  Filled 2015-12-13: qty 20

## 2015-12-13 MED ORDER — FENTANYL CITRATE (PF) 100 MCG/2ML IJ SOLN
INTRAMUSCULAR | Status: AC
  Filled 2015-12-13: qty 2

## 2015-12-13 NOTE — Procedures (Signed)
R hepatic abscess drain 10 Fr 20 cc pus No comp/EBL

## 2015-12-13 NOTE — Consult Note (Signed)
Gastroenterology and Hepatology Consult Note   History Howard Rhodes MRN # [id number]  Date of Admission: 12/07/2015 Date of Consultation: 12/13/2015 Referring physician: Dr. Alba Cory, MD  Reason for Consultation/Chief Complaint: hepatic abscess  Subjective HPI:  I was called by Dr Sunnie Nielsen last evening to see this patient for hepatic abscess. The patient is non-verbal and can give no Hx or ROS He was admitted from an ECF and found to have Klebsiella sepsis of unclear source.  CT scan abdomen yesterday afternoon found loculated liver abscess and possible GB wall thickening.  Hospitalist asked my advice re; further GB imaging. I advised calling IR for abscess drainage and surgical consult (which does not yet appear to be done). I spoke with Dr Bonnielee Haff of radiology and reviewed CT and U/S with him after he performed CT-guided drainage of hepatic abscess this morning, removing 50cc of rust colored purulent material so far.  ROS:  UTO b/c the patient is nonverbal  Past Medical History Past Medical History  Diagnosis Date  . HLD (hyperlipidemia)   . Arthritis   . Anemia   . Depression   . Hip fracture (HCC)   . Stroke [facility])     Past Surgical History Past Surgical History  Procedure Laterality Date  . Hernia repair    . Femur im nail Left 03/09/2013    Procedure: INTRAMEDULLARY (IM) NAIL FEMORAL;  Surgeon: Verlee Rossetti, MD;  Location: [facility] ORS;  Service: Orthopedics;  Laterality: Left;    Family History Family History  Problem Relation Age of Onset  . CAD Brother     Social History Social History   Social History  . Marital Status: Married    Spouse Name: N/A  . Number of Children: N/A  . Years of Education: N/A   Social History Main Topics  . Smoking status: Former Smoker -- 10 years    Types: Cigarettes, Cigars  . Smokeless tobacco: None  . Alcohol Use: No  . Drug Use: No  . Sexual Activity: Not Asked   Other Topics Concern  . None   Social History  Narrative    Allergies No Known Allergies  Outpatient Meds reviewed  Inpatient med list reviewed Abx are Zosyn and ciprofloxacin  _____________________________________________________________________ Objective  Exam:  Current vital signs  Patient Vitals for the past 8 hrs:  BP Temp Temp src Pulse Resp SpO2  12/13/15 1104 (!) 108/59 mmHg - - 87 16 96 %  12/13/15 1100 111/61 mmHg - - 86 16 97 %  12/13/15 1047 112/61 mmHg - - 85 16 96 %  12/13/15 0950 116/68 mmHg - - 85 16 98 %  12/13/15 0935 120/77 mmHg - - 84 (!) 24 97 %  12/13/15 0930 119/80 mmHg - - 85 17 98 %  12/13/15 0925 105/69 mmHg - - 84 (!) 27 97 %  12/13/15 0921 117/72 mmHg - - 83 (!) 22 97 %  12/13/15 0632 (!) 108/58 mmHg 99.4 F (37.4 C) Oral (!) 101 20 98 %    Intake/Output Summary (Last 24 hours) at 12/13/15 1121 Last data filed at 12/12/15 1300  Gross per 24 hour  Intake    240 ml  Output      0 ml  Net    240 ml    Physical Exam:  General: this is a non-verbal, chronically-ill appearing man who appears uncomfortable  Eyes: sclera anicteric, no redness  ENT: oral mucosa moist without lesions, no cervical or supraclavicular lymphadenopathy, fair dentition  CV: RRR  without murmur, S1/S2, no JVD,, 2+ peripheral edema  Resp: clear to auscultation bilaterally,  RR 20 and poor effort noted  GI: soft, +RUQ tenderness, with active bowel sounds. No guarding or palpable organomegaly noted, but limited by obesity  Skin; warm and dry, no rash or jaundice noted  Neuro: awake, visually tracks, cannot participate in a neuro exam  Labs:   Recent Labs Lab 12/10/15 0340 12/11/15 0350 12/13/15 0230  WBC 15.4* 14.8* 13.7*  HGB 14.0 12.9* 12.4*  HCT 40.7 37.8* 37.0*  PLT 124* 126* 215    Recent Labs Lab 12/12/15 0117 12/13/15 0230  NA 141 138  K 4.3 3.5  CL 110 108  CO2 20* 21*  BUN 8 9  ALBUMIN 1.8*  --   ALKPHOS 90  --   ALT 52  --   AST 58*  --   GLUCOSE 149* 98    Recent Labs Lab  12/08/15 0408  INR 1.34    Radiologic studies:  RUQ U/S shows GB sludge without stones, no wall thickening or pericholecystic fluid.  @ Impression:  Hepatic abscess Abnormal imaging Gall bladder Sepsis with Klebsiella bacteremia  I think he may have subacute cholecystitis seeding the liver abscess.  If so, difficult to say if the hepatic drain would also drain the GB (depends upon how well they may communicate with each other) No evidence biliary obstruction such as CBD stone Plan:  Continue Abx and drainage HIDA scan ordered.  If shows no GB filling, consider cholecystostomy tube.  He appears to be a poor surgical candidate.    Thank you for the courtesy of this consult.  Please contact me with any questions or concerns.  Charlie Pitter III Pager: [phone] Mon-Fri 8a-5p [phone] after 5p, weekends, holidays

## 2015-12-13 NOTE — Progress Notes (Signed)
PROGRESS NOTE  Howard Rhodes RUE:[id number] DOB: 03-22-1931 DOA: 12/07/2015 PCP: Ailene Ravel, MD  HPI/Recap of past 24 hours: Patient is an 82 year old male with past medical history of depression, stroke which has left him paraplegic who was brought in by wife on night of 2/12 after he had been confused and disoriented times several days as well as noted to be diaphoretic. The emergency room, patient can have white count of 28, lactic acid level 6.24, febrile and hypotensive which initially required fluids and then briefly pressors. Chest x-ray noted multi lobar pneumonia.   Patient admitted to the hospital service for septic shock. Prior to leaving the emergency room, able to be weaned off pressor support and maintain stable blood pressures.  Follow-up lactic acid level down to 2.  2/2 bottles of blood cultures growing out gram-negative rods. Urinalysis negative  Patient alert , no responsive.   Assessment/Plan:  Liver abscess, Gram-negative Septic shock (HCC), klebsiella, enteobacter Bacteremia  Patient meets criteria for septic shock on admission given hypotension persisting despite fluid support and briefly requiring pressor support, lactic acidosis, marked leukocytosis, tachypnea and tachycardia with pulmonary source noted on chest x-ray. Change Antibiotics to cipro, both organism sensitive to cipro.  Follow  Repeated Blood culture drawn 2-17.  RUQ [place] complex cyst in the liver, adjacent to gall bladder. Will get CT abdomen to better evaluate.  Will continue with IV ciprofloxacin.  -CT consistent with 2 liver abscess. CT pelvis negative for diverticulitis.  -Discussed with Dr. Bonnielee Haff, [place] is more accurate to evaluate Gallbladder, On US gallbladder without wall thickening abnormalities. He recommend to proceed with drainage of liver abscess.  -Discussed with Dr Myrtie Neither from GI, would proceed with HIDA scan to rule cholecystitis. If HIDA shows no GB filling will need to consult Surgery for  further evaluation and recommendations.    Multilobar, broncho-PNA Continue with cipro.   Encephalopathy; ammonia level elevated Continue with  lactulose  Suspect related to acute illness , liver abscess.   Hyperbilirubinemia: Significant acute illness. Elevated, check ammonia level. US showed complex cyst liver. Will order CT abdomen.  Bili trending down.   Dysphagia; on dysphagia 3 diet nectar thick.   Stroke (cerebrum) [facility]): At baseline  Hypokalemia:Replacing. Mg normal   Code Status: DNR  Family Communication: POA [name] updated 2-17.   Disposition Plan: Likely here for several days    Consultants:  None   Procedures:  None   Antibiotics:  IV Zosyn and vancomycin 2/13-present --- cipro    Objective: BP 107/62 mmHg  Pulse 85  Temp(Src) 99.4 F (37.4 C) (Oral)  Resp 16  Wt 86.637 kg (191 lb)  SpO2 96%  Intake/Output Summary (Last 24 hours) at 12/13/15 1253 Last data filed at 12/12/15 1300  Gross per 24 hour  Intake    240 ml  Output      0 ml  Net    240 ml   Filed Weights   12/12/15 1305  Weight: 86.637 kg (191 lb)    Exam:   General:   Alert, non verbal.   Cardiovascular: Regular rate and rhythm, S1-S2   Respiratory: Few rhonchi  Abdomen: Soft, obese, nontender, hypoactive bowel sounds   Musculoskeletal: Trace pitting edema bilaterally    Data Reviewed: Basic Metabolic Panel:  Recent Labs Lab 12/08/15 0408 12/09/15 0554 12/09/15 1615 12/10/15 0340 12/11/15 0350 12/12/15 0117 12/13/15 0230  NA  --  142  --  141 142 141 138  K  --  2.6*  --  3.0* 3.3* 4.3 3.5  CL  --  106  --  107 108 110 108  CO2  --  23  --  24 24 20* 21*  GLUCOSE  --  120*  --  130* 147* 149* 98  BUN  --  8  --  7 8 8 9   CREATININE  --  0.61  --  0.57* 0.59* 0.53* 0.51*  CALCIUM  --  8.3*  --  8.0* 8.0* 8.2* 7.9*  MG 1.7  --  2.0  --   --   --   --    Liver Function Tests:  Recent Labs Lab 12/07/15 2049 12/10/15 0340 12/11/15 0350  12/12/15 0117  AST 50* 42* 37 58*  ALT 35 53 46 52  ALKPHOS 83 71 80 90  BILITOT 2.3* 3.7* 3.1* 2.6*  PROT 6.1* 5.0* 5.0* 5.1*  ALBUMIN 2.7* 2.0* 1.8* 1.8*   No results for input(s): LIPASE, AMYLASE in the last 168 hours.  Recent Labs Lab 12/10/15 1542 12/11/15 0350 12/12/15 0121  AMMONIA 62* 56* 65*   CBC:  Recent Labs Lab 12/07/15 2049 12/08/15 1758 12/09/15 0554 12/10/15 0340 12/11/15 0350 12/13/15 0230  WBC 27.9* 20.2* 17.3* 15.4* 14.8* 13.7*  NEUTROABS 25.3*  --   --   --   --   --   HGB 16.3 15.2 14.4 14.0 12.9* 12.4*  HCT 47.9 44.1 42.5 40.7 37.8* 37.0*  MCV 95.4 94.0 94.4 92.9 93.3 94.1  PLT 164 149* 123* 124* 126* 215   Cardiac Enzymes:   No results for input(s): CKTOTAL, CKMB, CKMBINDEX, TROPONINI in the last 168 hours. BNP (last 3 results)  Recent Labs  04/09/15 2133  BNP 118.4*    ProBNP (last 3 results) No results for input(s): PROBNP in the last 8760 hours.  CBG: No results for input(s): GLUCAP in the last 168 hours.  Recent Results (from the past 240 hour(s))  Culture, blood (routine x 2)     Status: None   Collection Time: 12/07/15  8:44 PM  Result Value Ref Range Status   Specimen Description BLOOD RIGHT ANTECUBITAL  Final   Special Requests BOTTLES DRAWN AEROBIC AND ANAEROBIC 5CC   Final   Culture  Setup Time   Final    GRAM NEGATIVE RODS IN BOTH AEROBIC AND ANAEROBIC BOTTLES CRITICAL RESULT CALLED TO, READ BACK BY AND VERIFIED WITH: Lydia Guiles 12/08/15 @ 1259 M VESTAL    Culture ENTEROBACTER CLOACAE KLEBSIELLA PNEUMONIAE   Final   Report Status 12/10/2015 FINAL  Final   Organism ID, Bacteria ENTEROBACTER CLOACAE  Final   Organism ID, Bacteria KLEBSIELLA PNEUMONIAE  Final      Susceptibility   Enterobacter cloacae - MIC*    CEFAZOLIN >=64 RESISTANT Resistant     CEFEPIME 2 SENSITIVE Sensitive     CEFTAZIDIME >=64 RESISTANT Resistant     CEFTRIAXONE >=64 RESISTANT Resistant     CIPROFLOXACIN <=0.25 SENSITIVE Sensitive      GENTAMICIN <=1 SENSITIVE Sensitive     IMIPENEM <=0.25 SENSITIVE Sensitive     TRIMETH/SULFA <=20 SENSITIVE Sensitive     PIP/TAZO >=128 RESISTANT Resistant     * ENTEROBACTER CLOACAE   Klebsiella pneumoniae - MIC*    AMPICILLIN 16 RESISTANT Resistant     CEFAZOLIN <=4 SENSITIVE Sensitive     CEFEPIME <=1 SENSITIVE Sensitive     CEFTAZIDIME <=1 SENSITIVE Sensitive     CEFTRIAXONE <=1 SENSITIVE Sensitive     CIPROFLOXACIN <=0.25 SENSITIVE Sensitive  GENTAMICIN <=1 SENSITIVE Sensitive     IMIPENEM <=0.25 SENSITIVE Sensitive     TRIMETH/SULFA <=20 SENSITIVE Sensitive     AMPICILLIN/SULBACTAM <=2 SENSITIVE Sensitive     PIP/TAZO <=4 SENSITIVE Sensitive     * KLEBSIELLA PNEUMONIAE  Culture, blood (routine x 2)     Status: None   Collection Time: 12/07/15  8:49 PM  Result Value Ref Range Status   Specimen Description BLOOD RIGHT ANTECUBITAL  Final   Special Requests BOTTLES DRAWN AEROBIC AND ANAEROBIC 5CC   Final   Culture  Setup Time   Final    GRAM NEGATIVE RODS ANAEROBIC BOTTLE ONLY CRITICAL RESULT CALLED TO, READ BACK BY AND VERIFIED WITH: B. BLAKE,RN AT 0909 ON [id number] BY Lucienne Capers    Culture   Final    KLEBSIELLA PNEUMONIAE SUSCEPTIBILITIES PERFORMED ON PREVIOUS CULTURE WITHIN THE LAST 5 DAYS.    Report Status 12/12/2015 FINAL  Final  Urine culture     Status: None   Collection Time: 12/07/15  9:18 PM  Result Value Ref Range Status   Specimen Description URINE, RANDOM  Final   Special Requests NONE  Final   Culture NO GROWTH 1 DAY  Final   Report Status 12/08/2015 FINAL  Final  MRSA PCR Screening     Status: None   Collection Time: 12/09/15 10:14 AM  Result Value Ref Range Status   MRSA by PCR NEGATIVE NEGATIVE Final    Comment:        The GeneXpert MRSA Assay (FDA approved for NASAL specimens only), is one component of a comprehensive MRSA colonization surveillance program. It is not intended to diagnose MRSA infection nor to guide or monitor treatment  for MRSA infections.      Studies: Ct Pelvis Wo Contrast  12/12/2015  CLINICAL DATA:  82 year old male with hepatic abscess seen on earlier study. Delayed images through the pelvis performed to evaluate for possible diverticulitis. EXAM: CT PELVIS WITHOUT CONTRAST TECHNIQUE: Multidetector CT imaging of the pelvis was performed following the standard protocol without intravenous contrast. COMPARISON:  Earlier CT dated 12/12/2015 FINDINGS: No free air or free fluid identified within the pelvis. There is mild thickening and trabecular appearance of the bladder the, likely related to a degree of chronic bladder outlet obstruction. The prostate gland is grossly unremarkable. There are scattered sigmoid diverticula without active inflammatory changes. Normal appendix. No dilated bowel loops identified within the pelvis. There is aortoiliac atherosclerotic disease. There is a 3 cm aneurysmal dilatation of the distal aorta. No adenopathy identified within the pelvis. Small fat containing umbilical hernia. There is osteopenia with extensive degenerative changes of the spine. Left hip intra medullary orthopedic hardware and ectopic ossification. No acute fracture. IMPRESSION: Sigmoid diverticulosis. A 3 cm distal abdominal aortic aneurysm. Recommend followup by ultrasound in 3 years. This recommendation follows ACR consensus guidelines: White Paper of the ACR Incidental Findings Committee II on Vascular Findings. J Am Coll Radiol 2013; 21:308-657 Electronically Signed   By: Elgie Collard M.D.   On: 12/12/2015 21:05   Ct Angio Abdomen W/cm &/or Wo Contrast  12/12/2015  CLINICAL DATA:  Evaluate liver cyst on ultrasound EXAM: CT ANGIOGRAPHY ABDOMEN TECHNIQUE: Multidetector CT imaging of the abdomen was performed using the standard protocol during bolus administration of intravenous contrast. Multiplanar reconstructed images including MIPs were obtained and reviewed to evaluate the vascular anatomy. CONTRAST:   OMNIPAQUE IOHEXOL 300 MG/ML  SOLN COMPARISON:  None. FINDINGS: Fusiform ectasia of the infrarenal abdominal aorta, measuring 2.9 x 2.5  cm (series 3/image 89). No evidence of abdominal aortic aneurysm. Atherosclerotic calcifications of the abdominal aorta. Lower chest: Trace bilateral pleural effusions with associated mild dependent atelectasis in the bilateral lower lobes. Hepatobiliary: 5.2 x 6.1 x 4.7 cm complex cystic lesion in the anterior right hepatic dome (series 7/ image 36). Additional 4.7 x 6.2 x 6.2 cm complex cystic lesion inferiorly in the right hepatic lobe (series 7/image 50). This appearance is worrisome for multifocal hepatic abscesses. Additional scattered smaller cystic lesions measuring 11 mm in the medial segment left hepatic lobe (series 7/ image 33) and 13 mm posteriorly in the medial segment left hepatic lobe (series 7/ image 29) are indeterminate. Gallbladder is mildly thick-walled. Subtle discontinuity along the posterior gallbladder wall is not excluded (series 3/ image 45), possibly reflecting the etiology of the hepatic abscesses, although the relative lack gallbladder inflammation is unusual given the size of the liver involvement. No intrahepatic or extrahepatic ductal dilatation. Pancreas: Within normal limits. Spleen: Within normal limits. Adrenals/Urinary Tract: Adrenal glands are within normal limits. Kidneys are within normal limits.  No hydronephrosis. Stomach/Bowel: Stomach is within normal limits. Visualized bowel is unremarkable, noting colonic diverticulosis. Vascular/Lymphatic: Vascular findings as above. No suspicious abdominal lymphadenopathy. 14 mm short axis portacaval node (series 3/image 56), within the upper limits of normal. Other: No abdominal ascites. Musculoskeletal: Degenerative changes of the visualized thoracolumbar spine. Mild anterior wedging at T8. Review of the MIP images confirms the above findings. IMPRESSION: Two suspected hepatic abscesses measuring up  to 6.2 cm in the right hepatic lobe, as above. Possible discontinuity along the posterior gallbladder wall, equivocal. While this may be the source of the hepatic abscesses, the relative lack of gallbladder inflammation is unusual. Consider CT pelvis to evaluate for additional etiologies including diverticulitis. These results were called by telephone at the time of interpretation on 12/12/2015 at 5:20 pm to Dr. Hartley Barefoot , who verbally acknowledged these results. Electronically Signed   By: Charline Bills M.D.   On: 12/12/2015 17:23    Scheduled Meds: . antiseptic oral rinse  7 mL Mouth Rinse q12n4p  . chlorhexidine  15 mL Mouth Rinse BID  . ciprofloxacin  400 mg Intravenous Q12H  . fentaNYL      . lactulose  30 g Oral TID  . lidocaine      . midazolam        Continuous Infusions: . sodium chloride 50 mL/hr at 12/12/15 1754     Time spent: 25 minutes  Dandra Shambaugh A  [facility] Pager [phone] . If 7PM-7AM, please contact night-coverage at [email or web address], password [facility] 12/13/2015, 12:53 PM  LOS: 5 days

## 2015-12-13 NOTE — Consult Note (Signed)
Chief Complaint: Patient was seen in consultation today for  Chief Complaint  Patient presents with  . Altered Mental Status   at the request of * No referring provider recorded for this case *  Referring Physician(s): * No referring provider recorded for this case *  History of Present Illness: Howard Rhodes is a 82 y.o. male who was admitted for sepsis and was found to have a loculated liver abscess. Presently, he is hemodynamically stable after resuscitation. He is also nonverbal, but does have a POA for consent.   Past Medical History  Diagnosis Date  . HLD (hyperlipidemia)   . Arthritis   . Anemia   . Depression   . Hip fracture (HCC)   . Stroke [facility])     Past Surgical History  Procedure Laterality Date  . Hernia repair    . Femur im nail Left 03/09/2013    Procedure: INTRAMEDULLARY (IM) NAIL FEMORAL;  Surgeon: Verlee Rossetti, MD;  Location: [facility];  Service: Orthopedics;  Laterality: Left;    Allergies: Review of patient's allergies indicates no known allergies.  Medications: Prior to Admission medications   Medication Sig Start Date End Date Taking? Authorizing Provider  atorvastatin (LIPITOR) 40 MG tablet Take 40 mg by mouth daily at 6 PM.   Yes Historical Provider, MD  furosemide (LASIX) 20 MG tablet Take 1 tablet (20 mg total) by mouth daily. 04/18/15  Yes Dorothea Ogle, MD  cefUROXime (CEFTIN) 500 MG tablet Take 1 tablet (500 mg total) by mouth 2 (two) times daily with a meal. Patient not taking: Reported on 12/07/2015 04/18/15   Dorothea Ogle, MD  HYDROcodone-acetaminophen (NORCO/VICODIN) 5-325 MG per tablet Take 1 tablet by mouth every 6 (six) hours as needed for pain. Patient not taking: Reported on 12/07/2015 04/16/13   Vassie Loll, MD  levalbuterol [facility]) 0.63 MG/3ML nebulizer solution Take 3 mLs (0.63 mg total) by nebulization every 6 (six) hours as needed for wheezing or shortness of breath. Patient not taking: Reported on 12/07/2015 04/18/15   Dorothea Ogle, MD     Family History  Problem Relation Age of Onset  . CAD Brother     Social History   Social History  . Marital Status: Married    Spouse Name: N/A  . Number of Children: N/A  . Years of Education: N/A   Social History Main Topics  . Smoking status: Former Smoker -- 10 years    Types: Cigarettes, Cigars  . Smokeless tobacco: None  . Alcohol Use: No  . Drug Use: No  . Sexual Activity: Not Asked   Other Topics Concern  . None   Social History Narrative      Review of Systems: A 12 point ROS discussed and pertinent positives are indicated in the HPI above.  All other systems are negative.  Review of Systems  Vital Signs: BP 108/58 mmHg  Pulse 101  Temp(Src) 99.4 F (37.4 C) (Oral)  Resp 20  Wt 191 lb (86.637 kg)  SpO2 98%  Physical Exam  Constitutional: He appears well-developed and well-nourished.  Cardiovascular: Normal rate.   Pulmonary/Chest: Effort normal.  Neurological: He is alert.  Nonverbal. Disoriented.    Mallampati Score:   4  Imaging: Ct Pelvis Wo Contrast  12/12/2015  CLINICAL DATA:  82 year old male with hepatic abscess seen on earlier study. Delayed images through the pelvis performed to evaluate for possible diverticulitis. EXAM: CT PELVIS WITHOUT CONTRAST TECHNIQUE: Multidetector CT imaging of the pelvis  was performed following the standard protocol without intravenous contrast. COMPARISON:  Earlier CT dated 12/12/2015 FINDINGS: No free air or free fluid identified within the pelvis. There is mild thickening and trabecular appearance of the bladder the, likely related to a degree of chronic bladder outlet obstruction. The prostate gland is grossly unremarkable. There are scattered sigmoid diverticula without active inflammatory changes. Normal appendix. No dilated bowel loops identified within the pelvis. There is aortoiliac atherosclerotic disease. There is a 3 cm aneurysmal dilatation of the distal aorta. No adenopathy identified  within the pelvis. Small fat containing umbilical hernia. There is osteopenia with extensive degenerative changes of the spine. Left hip intra medullary orthopedic hardware and ectopic ossification. No acute fracture. IMPRESSION: Sigmoid diverticulosis. A 3 cm distal abdominal aortic aneurysm. Recommend followup by ultrasound in 3 years. This recommendation follows ACR consensus guidelines: White Paper of the ACR Incidental Findings Committee II on Vascular Findings. J Am Coll Radiol 2013; 16:109-604 Electronically Signed   By: Elgie Collard M.D.   On: 12/12/2015 21:05   Ct Angio Abdomen W/cm &/or Wo Contrast  12/12/2015  CLINICAL DATA:  Evaluate liver cyst on ultrasound EXAM: CT ANGIOGRAPHY ABDOMEN TECHNIQUE: Multidetector CT imaging of the abdomen was performed using the standard protocol during bolus administration of intravenous contrast. Multiplanar reconstructed images including MIPs were obtained and reviewed to evaluate the vascular anatomy. CONTRAST:  OMNIPAQUE IOHEXOL 300 MG/ML  SOLN COMPARISON:  None. FINDINGS: Fusiform ectasia of the infrarenal abdominal aorta, measuring 2.9 x 2.5 cm (series 3/image 89). No evidence of abdominal aortic aneurysm. Atherosclerotic calcifications of the abdominal aorta. Lower chest: Trace bilateral pleural effusions with associated mild dependent atelectasis in the bilateral lower lobes. Hepatobiliary: 5.2 x 6.1 x 4.7 cm complex cystic lesion in the anterior right hepatic dome (series 7/ image 36). Additional 4.7 x 6.2 x 6.2 cm complex cystic lesion inferiorly in the right hepatic lobe (series 7/image 50). This appearance is worrisome for multifocal hepatic abscesses. Additional scattered smaller cystic lesions measuring 11 mm in the medial segment left hepatic lobe (series 7/ image 33) and 13 mm posteriorly in the medial segment left hepatic lobe (series 7/ image 29) are indeterminate. Gallbladder is mildly thick-walled. Subtle discontinuity along the posterior  gallbladder wall is not excluded (series 3/ image 45), possibly reflecting the etiology of the hepatic abscesses, although the relative lack gallbladder inflammation is unusual given the size of the liver involvement. No intrahepatic or extrahepatic ductal dilatation. Pancreas: Within normal limits. Spleen: Within normal limits. Adrenals/Urinary Tract: Adrenal glands are within normal limits. Kidneys are within normal limits.  No hydronephrosis. Stomach/Bowel: Stomach is within normal limits. Visualized bowel is unremarkable, noting colonic diverticulosis. Vascular/Lymphatic: Vascular findings as above. No suspicious abdominal lymphadenopathy. 14 mm short axis portacaval node (series 3/image 56), within the upper limits of normal. Other: No abdominal ascites. Musculoskeletal: Degenerative changes of the visualized thoracolumbar spine. Mild anterior wedging at T8. Review of the MIP images confirms the above findings. IMPRESSION: Two suspected hepatic abscesses measuring up to 6.2 cm in the right hepatic lobe, as above. Possible discontinuity along the posterior gallbladder wall, equivocal. While this may be the source of the hepatic abscesses, the relative lack of gallbladder inflammation is unusual. Consider CT pelvis to evaluate for additional etiologies including diverticulitis. These results were called by telephone at the time of interpretation on 12/12/2015 at 5:20 pm to Dr. Hartley Barefoot , who verbally acknowledged these results. Electronically Signed   By: Charline Bills M.D.   On: 12/12/2015  17:23   Dg Chest Port 1 View  12/07/2015  CLINICAL DATA:  82 year old presenting with 2 day history of fever and acute mental status changes. Patient was hypoxic upon EMS arrival. EXAM: PORTABLE CHEST 1 VIEW COMPARISON:  CT chest 04/15/2015 and earlier. Chest x-rays 04/14/2015 and earlier. FINDINGS: Markedly suboptimal inspiration. Streaky and patchy airspace opacity in the lung bases and in the right upper lobe,  more than expected with just atelectasis due to low volumes. Cardiac silhouette mildly to moderately enlarged for technique and degree of inspiration, unchanged. Mild pulmonary venous hypertension without overt edema. IMPRESSION: Markedly suboptimal inspiration. Multilobar bronchopneumonia suspected involving both lung bases and the right upper lobe. Electronically Signed   By: Hulan Saas M.D.   On: 12/07/2015 21:33   Dg Swallowing Func-speech Pathology  12/10/2015  Objective Swallowing Evaluation: Type of Study: MBS-Modified Barium Swallow Study Patient Details Name: Howard Rhodes MRN: [id number] Date of Birth: 1931/05/19 Today's Date: 12/10/2015 Time: SLP Start Time (ACUTE ONLY): 0859-SLP Stop Time (ACUTE ONLY): 0919 SLP Time Calculation (min) (ACUTE ONLY): 20 min Past Medical History: Past Medical History Diagnosis Date . HLD (hyperlipidemia)  . Arthritis  . Anemia  . Depression  . Hip fracture (HCC)  . Stroke [facility])  Past Surgical History: Past Surgical History Procedure Laterality Date . Hernia repair   . Femur im nail Left 03/09/2013   Procedure: INTRAMEDULLARY (IM) NAIL FEMORAL;  Surgeon: Verlee Rossetti, MD;  Location: [facility];  Service: Orthopedics;  Laterality: Left; HPI: 82 year old male with past medical history of depression, stroke which has left him paraplegic who was brought in by wife on night of 2/12 after he had been confused and disoriented for several days as well as noted to be diaphoretic.  Dx sepsis secondary to CAP, acute encephalopathy. Followed by SLP services June 2016 admission - concerns for choking incidents with dry solids; he was D/Cd on a dysphagia 3 diet with thin liquids.  Subjective: alert, looks at examiner but responds intermittently Assessment / Plan / Recommendation CHL IP CLINICAL IMPRESSIONS 12/10/2015 Therapy Diagnosis Mild pharyngeal phase dysphagia;Moderate pharyngeal phase dysphagia Clinical Impression Pt exhibited mild-mod pharyngeal phase dysphagia, characterized by  delayed swallow initiation to the pyriform sinuses. This resulted in aspiration before the swallow during intake of thin liquids via cup with 5 second delayed cough . During intake of all other consistencies, oropharyngeal swallow appeared within functional limits. No mastication impairment or difficulty with straws observed, however due to pt's cognitive deficits regular texture and straws are not recommended as pt unlikely to sense aspiration. Recommend Dysphagia 3 (mechanical soft) diet, nectar thick liquids (no straws), meds whole in puree and full assist/supervision during meals. Pt educated re: diet recommendation. SLP will f/u to determine diet toleration. Impact on safety and function Moderate aspiration risk;Severe aspiration risk   CHL IP TREATMENT RECOMMENDATION 12/10/2015 Treatment Recommendations Therapy as outlined in treatment plan below   Prognosis 12/10/2015 Prognosis for Safe Diet Advancement Fair Barriers to Reach Goals Cognitive deficits;Severity of deficits Barriers/Prognosis Comment -- CHL IP DIET RECOMMENDATION 12/10/2015 SLP Diet Recommendations Dysphagia 3 (Mech soft) solids;Nectar thick liquid Liquid Administration via Cup;No straw Medication Administration Whole meds with puree Compensations Minimize environmental distractions;Slow rate;Small sips/bites Postural Changes Seated upright at 90 degrees   CHL IP OTHER RECOMMENDATIONS 12/10/2015 Recommended Consults -- Oral Care Recommendations Oral care BID Other Recommendations Order thickener from pharmacy   CHL IP FOLLOW UP RECOMMENDATIONS 12/10/2015 Follow up Recommendations (No Data)   CHL IP FREQUENCY AND DURATION 12/10/2015 Speech Therapy  Frequency (ACUTE ONLY) min 2x/week Treatment Duration 2 weeks      CHL IP ORAL PHASE 12/10/2015 Oral Phase WFL Oral - Pudding Teaspoon -- Oral - Pudding Cup -- Oral - Honey Teaspoon -- Oral - Honey Cup -- Oral - Nectar Teaspoon -- Oral - Nectar Cup -- Oral - Nectar Straw -- Oral - Thin Teaspoon -- Oral - Thin  Cup -- Oral - Thin Straw -- Oral - Puree -- Oral - Mech Soft -- Oral - Regular -- Oral - Multi-Consistency -- Oral - Pill -- Oral Phase - Comment --  CHL IP PHARYNGEAL PHASE 12/10/2015 Pharyngeal Phase Impaired Pharyngeal- Pudding Teaspoon -- Pharyngeal -- Pharyngeal- Pudding Cup -- Pharyngeal -- Pharyngeal- Honey Teaspoon -- Pharyngeal -- Pharyngeal- Honey Cup -- Pharyngeal -- Pharyngeal- Nectar Teaspoon -- Pharyngeal -- Pharyngeal- Nectar Cup Delayed swallow initiation-vallecula Pharyngeal -- Pharyngeal- Nectar Straw WFL Pharyngeal -- Pharyngeal- Thin Teaspoon -- Pharyngeal -- Pharyngeal- Thin Cup Delayed swallow initiation-pyriform sinuses;Penetration/Aspiration before swallow;Moderate aspiration Pharyngeal Material enters airway, passes BELOW cords and not ejected out despite cough attempt by patient Pharyngeal- Thin Straw -- Pharyngeal -- Pharyngeal- Puree WFL Pharyngeal -- Pharyngeal- Mechanical Soft -- Pharyngeal -- Pharyngeal- Regular -- Pharyngeal -- Pharyngeal- Multi-consistency -- Pharyngeal -- Pharyngeal- Pill -- Pharyngeal -- Pharyngeal Comment --  CHL IP CERVICAL ESOPHAGEAL PHASE 12/10/2015 Cervical Esophageal Phase WFL Pudding Teaspoon -- Pudding Cup -- Honey Teaspoon -- Honey Cup -- Nectar Teaspoon -- Nectar Cup -- Nectar Straw -- Thin Teaspoon -- Thin Cup -- Thin Straw -- Puree -- Mechanical Soft -- Regular -- Multi-consistency -- Pill -- Cervical Esophageal Comment -- No flowsheet data found. Rhodes Macadamia 12/10/2015, 10:31 AM  Breck Coons Lonell Face.Ed CCC-SLP Pager [phone]             US Abdomen Limited Ruq  12/10/2015  CLINICAL DATA:  Abnormal LFTs. EXAM: US ABDOMEN LIMITED - RIGHT UPPER QUADRANT COMPARISON:  04/13/2015 FINDINGS: Gallbladder: Layering sludge noted in the lumen of the gallbladder. No gallbladder wall thickening or pericholecystic fluid. Sonographer reports no sonographic Murphy sign. Common bile duct: Diameter: 6 mm Liver: 4.5 by 4.3 x 4.8 cm complex cystic lesion  identified in the inferior right liver, adjacent to the gallbladder. This was not visualized on the prior study. IMPRESSION: Complex cystic and solid lesion in the inferior right liver adjacent to the gallbladder. This lesion will require further imaging characterization as neoplasm could have this appearance. Consider CT imaging without and with contrast. Electronically Signed   By: Kennith Center M.D.   On: 12/10/2015 19:50    Labs:  CBC:  Recent Labs  12/09/15 0554 12/10/15 0340 12/11/15 0350 12/13/15 0230  WBC 17.3* 15.4* 14.8* 13.7*  HGB 14.4 14.0 12.9* 12.4*  HCT 42.5 40.7 37.8* 37.0*  PLT 123* 124* 126* 215    COAGS:  Recent Labs  04/09/15 2133 12/08/15 0408  INR 1.35 1.34  APTT 29 37    BMP:  Recent Labs  12/10/15 0340 12/11/15 0350 12/12/15 0117 12/13/15 0230  NA 141 142 141 138  K 3.0* 3.3* 4.3 3.5  CL 107 108 110 108  CO2 24 24 20* 21*  GLUCOSE 130* 147* 149* 98  BUN 7 8 8 9   CALCIUM 8.0* 8.0* 8.2* 7.9*  CREATININE 0.57* 0.59* 0.53* 0.51*  GFRNONAA >60 >60 >60 >60  GFRAA >60 >60 >60 >60    LIVER FUNCTION TESTS:  Recent Labs  12/07/15 2049 12/10/15 0340 12/11/15 0350 12/12/15 0117  BILITOT 2.3* 3.7* 3.1* 2.6*  AST  50* 42* 37 58*  ALT 35 53 46 52  ALKPHOS 83 71 80 90  PROT 6.1* 5.0* 5.0* 5.1*  ALBUMIN 2.7* 2.0* 1.8* 1.8*    TUMOR MARKERS: No results for input(s): AFPTM, CEA, CA199, CHROMGRNA in the last 8760 hours.  Assessment and Plan:  Liver abscess. Will proceed with percutaneous drain.  Thank you for this interesting consult.  I greatly enjoyed meeting Nikoloz Huy and look forward to participating in their care.  A copy of this report was sent to the requesting provider on this date.  Electronically Signed: Shamaya Kauer, ART A 12/13/2015, 9:18 AM   I spent a total of 40 Minutes  in face to face in clinical consultation, greater than 50% of which was counseling/coordinating care for liver abscess.

## 2015-12-14 ENCOUNTER — Inpatient Hospital Stay (HOSPITAL_COMMUNITY): Payer: Medicare Other

## 2015-12-14 LAB — CBC
HCT: 36.2 % — ABNORMAL LOW (ref 39.0–52.0)
Hemoglobin: 12.3 g/dL — ABNORMAL LOW (ref 13.0–17.0)
MCH: 31.9 pg (ref 26.0–34.0)
MCHC: 34 g/dL (ref 30.0–36.0)
MCV: 94 fL (ref 78.0–100.0)
PLATELETS: 233 10*3/uL (ref 150–400)
RBC: 3.85 MIL/uL — AB (ref 4.22–5.81)
RDW: 15.4 % (ref 11.5–15.5)
WBC: 13.7 10*3/uL — ABNORMAL HIGH (ref 4.0–10.5)

## 2015-12-14 LAB — COMPREHENSIVE METABOLIC PANEL
ALT: 44 U/L (ref 17–63)
AST: 45 U/L — ABNORMAL HIGH (ref 15–41)
Albumin: 1.6 g/dL — ABNORMAL LOW (ref 3.5–5.0)
Alkaline Phosphatase: 80 U/L (ref 38–126)
Anion gap: 11 (ref 5–15)
BUN: 7 mg/dL (ref 6–20)
CHLORIDE: 105 mmol/L (ref 101–111)
CO2: 21 mmol/L — ABNORMAL LOW (ref 22–32)
CREATININE: 0.53 mg/dL — AB (ref 0.61–1.24)
Calcium: 7.7 mg/dL — ABNORMAL LOW (ref 8.9–10.3)
Glucose, Bld: 107 mg/dL — ABNORMAL HIGH (ref 65–99)
Potassium: 3.8 mmol/L (ref 3.5–5.1)
Sodium: 137 mmol/L (ref 135–145)
TOTAL PROTEIN: 4.9 g/dL — AB (ref 6.5–8.1)
Total Bilirubin: 2.7 mg/dL — ABNORMAL HIGH (ref 0.3–1.2)

## 2015-12-14 LAB — AMMONIA: AMMONIA: 44 umol/L — AB (ref 9–35)

## 2015-12-14 MED ORDER — MORPHINE SULFATE (PF) 4 MG/ML IV SOLN
3.4700 mg | Freq: Once | INTRAVENOUS | Status: AC
  Administered 2015-12-14: 3.47 mg via INTRAVENOUS

## 2015-12-14 MED ORDER — SODIUM CHLORIDE 0.9 % IV SOLN
INTRAVENOUS | Status: DC
  Administered 2015-12-15 – 2015-12-17 (×4): via INTRAVENOUS

## 2015-12-14 MED ORDER — MORPHINE SULFATE (PF) 4 MG/ML IV SOLN
INTRAVENOUS | Status: AC
  Filled 2015-12-14: qty 1

## 2015-12-14 MED ORDER — TECHNETIUM TC 99M MEBROFENIN IV KIT
5.4000 | PACK | Freq: Once | INTRAVENOUS | Status: AC | PRN
  Administered 2015-12-14: 5 via INTRAVENOUS

## 2015-12-14 NOTE — Consult Note (Signed)
Reason for Consult:  Acute cholecystitis Referring Physician: [name] is an 82 y.o. male.  HPI: Admitted about a week ago with sepsis secondary to a liver abscess which was percutaneously drained yesterday. Ultrasound did not demonstrate any evidence of acute cholecystectomy, but because of the liver abscess there was concern.  A HIDA scan was performed which showed non-filling of the gallbladder.  Surgery was called for the diagnosis of acute cholecystitis.  Past Medical History  Diagnosis Date  . HLD (hyperlipidemia)   . Arthritis   . Anemia   . Depression   . Hip fracture ([place])   . Stroke [facility])     Past Surgical History  Procedure Laterality Date  . Hernia repair    . Femur im nail Left 03/09/2013    Procedure: INTRAMEDULLARY (IM) NAIL FEMORAL;  Surgeon: Augustin Schooling, MD;  Location: [facility] ORS;  Service: Orthopedics;  Laterality: Left;    Family History  Problem Relation Age of Onset  . CAD Brother     Social History:  reports that he has quit smoking. His smoking use included Cigarettes and Cigars. He quit after 10 years of use. He does not have any smokeless tobacco history on file. He reports that he does not drink alcohol or use illicit drugs.  Allergies: No Known Allergies  Medications: I have reviewed the patient's current medications.  Results for orders placed or performed during the hospital encounter of 12/07/15 (from the past 48 hour(s))  CBC     Status: Abnormal   Collection Time: 12/13/15  2:30 AM  Result Value Ref Range   WBC 13.7 (H) 4.0 - 10.5 K/uL   RBC 3.93 (L) 4.22 - 5.81 MIL/uL   Hemoglobin 12.4 (L) 13.0 - 17.0 g/dL   HCT 37.0 (L) 39.0 - 52.0 %   MCV 94.1 78.0 - 100.0 fL   MCH 31.6 26.0 - 34.0 pg   MCHC 33.5 30.0 - 36.0 g/dL   RDW 15.5 11.5 - 15.5 %   Platelets 215 150 - 400 K/uL  Basic metabolic panel     Status: Abnormal   Collection Time: 12/13/15  2:30 AM  Result Value Ref Range   Sodium 138 135 - 145 mmol/L   Potassium 3.5 3.5  - 5.1 mmol/L    Comment: DELTA CHECK NOTED   Chloride 108 101 - 111 mmol/L   CO2 21 (L) 22 - 32 mmol/L   Glucose, Bld 98 65 - 99 mg/dL   BUN 9 6 - 20 mg/dL   Creatinine, Ser 0.51 (L) 0.61 - 1.24 mg/dL   Calcium 7.9 (L) 8.9 - 10.3 mg/dL   GFR calc non Af Amer >60 >60 mL/min   GFR calc Af Amer >60 >60 mL/min    Comment: (NOTE) The eGFR has been calculated using the CKD EPI equation. This calculation has not been validated in all clinical situations. eGFR's persistently <60 mL/min signify possible Chronic Kidney Disease.    Anion gap 9 5 - 15  Glucose, capillary     Status: None   Collection Time: 12/13/15  1:03 PM  Result Value Ref Range   Glucose-Capillary 88 65 - 99 mg/dL  CBC     Status: Abnormal   Collection Time: 12/14/15  2:14 AM  Result Value Ref Range   WBC 13.7 (H) 4.0 - 10.5 K/uL   RBC 3.85 (L) 4.22 - 5.81 MIL/uL   Hemoglobin 12.3 (L) 13.0 - 17.0 g/dL   HCT 36.2 (L) 39.0 - 52.0 %  MCV 94.0 78.0 - 100.0 fL   MCH 31.9 26.0 - 34.0 pg   MCHC 34.0 30.0 - 36.0 g/dL   RDW 15.4 11.5 - 15.5 %   Platelets 233 150 - 400 K/uL  Comprehensive metabolic panel     Status: Abnormal   Collection Time: 12/14/15  2:14 AM  Result Value Ref Range   Sodium 137 135 - 145 mmol/L   Potassium 3.8 3.5 - 5.1 mmol/L   Chloride 105 101 - 111 mmol/L   CO2 21 (L) 22 - 32 mmol/L   Glucose, Bld 107 (H) 65 - 99 mg/dL   BUN 7 6 - 20 mg/dL   Creatinine, Ser 0.53 (L) 0.61 - 1.24 mg/dL   Calcium 7.7 (L) 8.9 - 10.3 mg/dL   Total Protein 4.9 (L) 6.5 - 8.1 g/dL   Albumin 1.6 (L) 3.5 - 5.0 g/dL   AST 45 (H) 15 - 41 U/L   ALT 44 17 - 63 U/L   Alkaline Phosphatase 80 38 - 126 U/L   Total Bilirubin 2.7 (H) 0.3 - 1.2 mg/dL   GFR calc non Af Amer >60 >60 mL/min   GFR calc Af Amer >60 >60 mL/min    Comment: (NOTE) The eGFR has been calculated using the CKD EPI equation. This calculation has not been validated in all clinical situations. eGFR's persistently <60 mL/min signify possible Chronic  Kidney Disease.    Anion gap 11 5 - 15  Ammonia     Status: Abnormal   Collection Time: 12/14/15  2:14 AM  Result Value Ref Range   Ammonia 44 (H) 9 - 35 umol/L    Ct Pelvis Wo Contrast  12/12/2015  CLINICAL DATA:  82 year old male with hepatic abscess seen on earlier study. Delayed images through the pelvis performed to evaluate for possible diverticulitis. EXAM: CT PELVIS WITHOUT CONTRAST TECHNIQUE: Multidetector CT imaging of the pelvis was performed following the standard protocol without intravenous contrast. COMPARISON:  Earlier CT dated 12/12/2015 FINDINGS: No free air or free fluid identified within the pelvis. There is mild thickening and trabecular appearance of the bladder the, likely related to a degree of chronic bladder outlet obstruction. The prostate gland is grossly unremarkable. There are scattered sigmoid diverticula without active inflammatory changes. Normal appendix. No dilated bowel loops identified within the pelvis. There is aortoiliac atherosclerotic disease. There is a 3 cm aneurysmal dilatation of the distal aorta. No adenopathy identified within the pelvis. Small fat containing umbilical hernia. There is osteopenia with extensive degenerative changes of the spine. Left hip intra medullary orthopedic hardware and ectopic ossification. No acute fracture. IMPRESSION: Sigmoid diverticulosis. A 3 cm distal abdominal aortic aneurysm. Recommend followup by ultrasound in 3 years. This recommendation follows ACR consensus guidelines: White Paper of the ACR Incidental Findings Committee II on Vascular Findings. J Am Coll Radiol 2013; 46:568-127 Electronically Signed   By: Anner Crete M.D.   On: 12/12/2015 21:05   Ct Angio Abdomen W/cm &/or Wo Contrast  12/12/2015  CLINICAL DATA:  Evaluate liver cyst on ultrasound EXAM: CT ANGIOGRAPHY ABDOMEN TECHNIQUE: Multidetector CT imaging of the abdomen was performed using the standard protocol during bolus administration of intravenous  contrast. Multiplanar reconstructed images including MIPs were obtained and reviewed to evaluate the vascular anatomy. CONTRAST:  164m OMNIPAQUE IOHEXOL 300 MG/ML  SOLN COMPARISON:  None. FINDINGS: Fusiform ectasia of the infrarenal abdominal aorta, measuring 2.9 x 2.5 cm (series 3/image 89). No evidence of abdominal aortic aneurysm. Atherosclerotic calcifications of the abdominal aorta. Lower chest:  Trace bilateral pleural effusions with associated mild dependent atelectasis in the bilateral lower lobes. Hepatobiliary: 5.2 x 6.1 x 4.7 cm complex cystic lesion in the anterior right hepatic dome (series 7/ image 36). Additional 4.7 x 6.2 x 6.2 cm complex cystic lesion inferiorly in the right hepatic lobe (series 7/image 50). This appearance is worrisome for multifocal hepatic abscesses. Additional scattered smaller cystic lesions measuring 11 mm in the medial segment left hepatic lobe (series 7/ image 33) and 13 mm posteriorly in the medial segment left hepatic lobe (series 7/ image 29) are indeterminate. Gallbladder is mildly thick-walled. Subtle discontinuity along the posterior gallbladder wall is not excluded (series 3/ image 45), possibly reflecting the etiology of the hepatic abscesses, although the relative lack gallbladder inflammation is unusual given the size of the liver involvement. No intrahepatic or extrahepatic ductal dilatation. Pancreas: Within normal limits. Spleen: Within normal limits. Adrenals/Urinary Tract: Adrenal glands are within normal limits. Kidneys are within normal limits.  No hydronephrosis. Stomach/Bowel: Stomach is within normal limits. Visualized bowel is unremarkable, noting colonic diverticulosis. Vascular/Lymphatic: Vascular findings as above. No suspicious abdominal lymphadenopathy. 14 mm short axis portacaval node (series 3/image 56), within the upper limits of normal. Other: No abdominal ascites. Musculoskeletal: Degenerative changes of the visualized thoracolumbar spine. Mild  anterior wedging at T8. Review of the MIP images confirms the above findings. IMPRESSION: Two suspected hepatic abscesses measuring up to 6.2 cm in the right hepatic lobe, as above. Possible discontinuity along the posterior gallbladder wall, equivocal. While this may be the source of the hepatic abscesses, the relative lack of gallbladder inflammation is unusual. Consider CT pelvis to evaluate for additional etiologies including diverticulitis. These results were called by telephone at the time of interpretation on 12/12/2015 at 5:20 pm to Dr. Niel Hummer , who verbally acknowledged these results. Electronically Signed   By: Julian Hy M.D.   On: 12/12/2015 17:23   Nm Hepatobiliary Liver Func  12/14/2015  CLINICAL DATA:  History of hepatic abscess. Question of subacute cholecystitis associated with abscess. EXAM: NUCLEAR MEDICINE HEPATOBILIARY IMAGING TECHNIQUE: Sequential images of the abdomen were obtained out to 60 minutes following intravenous administration of radiopharmaceutical. RADIOPHARMACEUTICALS:  5.4 mCi Tc-75m Choletec IV COMPARISON:  Ultrasound 12/10/2015 FINDINGS: Initial imaging is performed following administration Choletec. After 45 minutes, of gallbladder is not visualized. Morphine was administered. Additional imaging is performed, failing to reveal gallbladder activity. IMPRESSION: Findings are consistent with acute cholecystitis. Electronically Signed   By: ENolon NationsM.D.   On: 12/14/2015 12:41   Ir Guided DNiel HummerW Catheter Placement  12/13/2015  INDICATION: Liver abscess EXAM: ABSCESS DRAINAGE; IR ULTRASOUND GUIDANCE MEDICATIONS: The patient is currently admitted to the hospital and receiving intravenous antibiotics. The antibiotics were administered within an appropriate time frame prior to the initiation of the procedure. ANESTHESIA/SEDATION: Fentanyl 1 mcg IV; Versed 50 mg IV Moderate Sedation Time:  13 The patient was continuously monitored during the procedure by  the interventional radiology nurse under my direct supervision. COMPLICATIONS: None immediate. PROCEDURE: Informed written consent was obtained from the patient after a thorough discussion of the procedural risks, benefits and alternatives. All questions were addressed. Maximal Sterile Barrier Technique was utilized including caps, mask, sterile gowns, sterile gloves, sterile drape, hand hygiene and skin antiseptic. A timeout was performed prior to the initiation of the procedure. Under sonographic guidance, a 21 gauge needle was inserted into the liver abscess via inter costal approach. It was removed over a 018 wire which was up sized to a  3 J. a 10 French dilator followed by a 10 [place] drain were inserted into the abscess. It was looped and string fixed. It was sewn to the skin. Contrast was injected. 20 cc of pus was aspirated. FINDINGS: Imaging documents placement of a 10 French drain in a liver abscess. IMPRESSION: Successful 10 French hepatic abscess strain yielding pus. Electronically Signed   By: Marybelle Killings M.D.   On: 12/13/2015 13:17   Ir US Guide Bx Asp/drain  12/13/2015  INDICATION: Liver abscess EXAM: ABSCESS DRAINAGE; IR ULTRASOUND GUIDANCE MEDICATIONS: The patient is currently admitted to the hospital and receiving intravenous antibiotics. The antibiotics were administered within an appropriate time frame prior to the initiation of the procedure. ANESTHESIA/SEDATION: Fentanyl 1 mcg IV; Versed 50 mg IV Moderate Sedation Time:  13 The patient was continuously monitored during the procedure by the interventional radiology nurse under my direct supervision. COMPLICATIONS: None immediate. PROCEDURE: Informed written consent was obtained from the patient after a thorough discussion of the procedural risks, benefits and alternatives. All questions were addressed. Maximal Sterile Barrier Technique was utilized including caps, mask, sterile gowns, sterile gloves, sterile drape, hand hygiene and skin  antiseptic. A timeout was performed prior to the initiation of the procedure. Under sonographic guidance, a 21 gauge needle was inserted into the liver abscess via inter costal approach. It was removed over a 018 wire which was up sized to a 3 J. a 10 [place] dilator followed by a 10 [place] drain were inserted into the abscess. It was looped and string fixed. It was sewn to the skin. Contrast was injected. 20 cc of pus was aspirated. FINDINGS: Imaging documents placement of a 10 French drain in a liver abscess. IMPRESSION: Successful 10 French hepatic abscess strain yielding pus. Electronically Signed   By: Marybelle Killings M.D.   On: 12/13/2015 13:17    Review of Systems  Unable to perform ROS: patient nonverbal   Blood pressure 103/59, pulse 84, temperature 98.3 F (36.8 C), temperature source Oral, resp. rate 18, weight 86.637 kg (191 lb), SpO2 95 %. Physical Exam  Vitals reviewed. Constitutional: He appears well-developed and well-nourished.  HENT:  Head: Normocephalic and atraumatic.  Eyes: Conjunctivae and EOM are normal. Pupils are equal, round, and reactive to light.  Cardiovascular: Normal rate and normal heart sounds.   Respiratory: Effort normal and breath sounds normal.  GI: Soft. Normal appearance and bowel sounds are normal. He exhibits no distension, no pulsatile liver and no mass. There is no tenderness. There is no rebound and no guarding.    Drain in the RUQ at costal margin  Neurological: He is alert. GCS eye subscore is 4. GCS verbal subscore is 1. GCS motor subscore is 5.  Psychiatric: His affect is blunt. He is slowed. He is noncommunicative.    Assessment/Plan: Abnormal HIDA scan after percutaneous drainage of a liver abscess which is now draining bile.  He sepsis is resolving.   Although a non-filling gallbladder is usually evidence of acute cholecystitis, in this case the results could be misleading.  He may have had an acute GB that ruptured into the parenchyma of the  liver, giving him the abscess.  The drainage of the abscess may have decompressed the the GB also now that there is overt bilious output from the abscess catheter.  Either this patient needs a GB percutaneous drain or nothing, but I would ask radiology if they feel as though the abscess catheter is decompressing the GB also.  If so, then  this could explain the "positive" non-filling GB that could be having the bile diverted.    Howard Rhodes 12/14/2015, 4:12 PM

## 2015-12-14 NOTE — Progress Notes (Signed)
Phone call from nuclear medicine - requesting who provides consent for procedures. Gave contact information for his daughter-in-law [name], cell and home phone numbers under contact information.

## 2015-12-14 NOTE — Progress Notes (Signed)
PROGRESS NOTE  Howard Rhodes ZOX:[id number] DOB: Jan 04, 1931 DOA: 12/07/2015 PCP: Ailene Ravel, MD  HPI/Recap of past 24 hours: Patient is an 82 year old male with past medical history of depression, stroke which has left him paraplegic who was brought in by wife on night of 2/12 after he had been confused and disoriented times several days as well as noted to be diaphoretic. The emergency room, patient can have white count of 28, lactic acid level 6.24, febrile and hypotensive which initially required fluids and then briefly pressors. Chest x-ray noted multi lobar pneumonia.   Patient admitted to the hospital service for septic shock. Prior to leaving the emergency room, able to be weaned off pressor support and maintain stable blood pressures.  Follow-up lactic acid level down to 2.  2/2 bottles of blood cultures growing out gram-negative rods. Urinalysis negative  Patient alert , more verbal today   Assessment/Plan:  Liver abscess, Gram-negative Septic shock (HCC), klebsiella, enteobacter Bacteremia  Patient meets criteria for septic shock on admission given hypotension persisting despite fluid support and briefly requiring pressor support, lactic acidosis, marked leukocytosis, tachypnea and tachycardia with pulmonary source noted on chest x-ray. Change Antibiotics to cipro, both organism sensitive to cipro.  Follow  Repeated Blood culture drawn 2-17. No growth to date.  RUQ [place] complex cyst in the liver, adjacent to gall bladder. Will get CT abdomen to better evaluate.  Will continue with IV ciprofloxacin.  -CT consistent with 2 liver abscess. CT pelvis negative for diverticulitis.  -S/P percutaneous drainage of liver abscess 2-18.  -HIDA scan positive  for cholecystitis. Surgery consulted, recommendation might be cholecystostomy tube.  -will follow surgery  recommendations , will need to ask IR to evaluate , maybe liver drainage might connect with gallbladder.  -culture from liver  abscess pending.   Multilobar, broncho-PNA Continue with cipro.   Encephalopathy; ammonia level elevated Continue with  lactulose  Suspect related to acute illness , liver abscess.  Improved today, Patient is more verbal, he said: She is my wife, he said his name.   Hyperbilirubinemia: secondary to liver abscess; Elevated ammonia level. US showed complex cyst liver.  CT abdomen showed liver abscess.  Bili trending down. Ammonia trending down.   Dysphagia; on dysphagia 3 diet nectar thick.   Stroke (cerebrum) [facility]): At baseline  Hypokalemia:Replacing. Mg normal   Code Status: DNR  Family Communication: POA [name] updated 2-18, son updated 2-19 .   Disposition Plan: Likely here for several days    Consultants:  None   Procedures:  None   Antibiotics:  IV Zosyn and vancomycin 2/13-present --- cipro    Objective: BP 103/59 mmHg  Pulse 84  Temp(Src) 98.3 F (36.8 C) (Oral)  Resp 18  Wt 86.637 kg (191 lb)  SpO2 95%  Intake/Output Summary (Last 24 hours) at 12/14/15 1454 Last data filed at 12/14/15 1400  Gross per 24 hour  Intake    280 ml  Output    150 ml  Net    130 ml   Filed Weights   12/12/15 1305  Weight: 86.637 kg (191 lb)    Exam:   General:   Alert, non verbal.   Cardiovascular: Regular rate and rhythm, S1-S2   Respiratory: Few rhonchi  Abdomen: Soft, obese, nontender, hypoactive bowel sounds   Musculoskeletal: Trace pitting edema bilaterally    Data Reviewed: Basic Metabolic Panel:  Recent Labs Lab 12/08/15 0408  12/09/15 1615 12/10/15 0340 12/11/15 0350 12/12/15 0117 12/13/15 0230 12/14/15 0214  NA  --   < >  --  141 142 141 138 137  K  --   < >  --  3.0* 3.3* 4.3 3.5 3.8  CL  --   < >  --  107 108 110 108 105  CO2  --   < >  --  24 24 20* 21* 21*  GLUCOSE  --   < >  --  130* 147* 149* 98 107*  BUN  --   < >  --  CREATININE  --   < >  --  0.57* 0.59* 0.53* 0.51* 0.53*  CALCIUM  --   < >  --  8.0* 8.0*  8.2* 7.9* 7.7*  MG 1.7  --  2.0  --   --   --   --   --   < > = values in this interval not displayed. Liver Function Tests:  Recent Labs Lab 12/07/15 2049 12/10/15 0340 12/11/15 0350 12/12/15 0117 12/14/15 0214  AST 50* 42* 37 58* 45*  ALT 35 53 46 52 44  ALKPHOS 83 71 80 90 80  BILITOT 2.3* 3.7* 3.1* 2.6* 2.7*  PROT 6.1* 5.0* 5.0* 5.1* 4.9*  ALBUMIN 2.7* 2.0* 1.8* 1.8* 1.6*   No results for input(s): LIPASE, AMYLASE in the last 168 hours.  Recent Labs Lab 12/10/15 1542 12/11/15 0350 12/12/15 0121 12/14/15 0214  AMMONIA 62* 56* 65* 44*   CBC:  Recent Labs Lab 12/07/15 2049  12/09/15 0554 12/10/15 0340 12/11/15 0350 12/13/15 0230 12/14/15 0214  WBC 27.9*  < > 17.3* 15.4* 14.8* 13.7* 13.7*  NEUTROABS 25.3*  --   --   --   --   --   --   HGB 16.3  < > 14.4 14.0 12.9* 12.4* 12.3*  HCT 47.9  < > 42.5 40.7 37.8* 37.0* 36.2*  MCV 95.4  < > 94.4 92.9 93.3 94.1 94.0  PLT 164  < > 123* 124* 126* 215 233  < > = values in this interval not displayed. Cardiac Enzymes:   No results for input(s): CKTOTAL, CKMB, CKMBINDEX, TROPONINI in the last 168 hours. BNP (last 3 results)  Recent Labs  04/09/15 2133  BNP 118.4*    ProBNP (last 3 results) No results for input(s): PROBNP in the last 8760 hours.  CBG:  Recent Labs Lab 12/13/15 1303  GLUCAP 88    Recent Results (from the past 240 hour(s))  Culture, blood (routine x 2)     Status: None   Collection Time: 12/07/15  8:44 PM  Result Value Ref Range Status   Specimen Description BLOOD RIGHT ANTECUBITAL  Final   Special Requests BOTTLES DRAWN AEROBIC AND ANAEROBIC 5CC   Final   Culture  Setup Time   Final    GRAM NEGATIVE RODS IN BOTH AEROBIC AND ANAEROBIC BOTTLES CRITICAL RESULT CALLED TO, READ BACK BY AND VERIFIED WITH: G SANTANELLA 12/08/15 @ 1259 M VESTAL    Culture ENTEROBACTER CLOACAE KLEBSIELLA PNEUMONIAE   Final   Report Status 12/10/2015 FINAL  Final   Organism ID, Bacteria ENTEROBACTER CLOACAE   Final   Organism ID, Bacteria KLEBSIELLA PNEUMONIAE  Final      Susceptibility   Enterobacter cloacae - MIC*    CEFAZOLIN >=64 RESISTANT Resistant     CEFEPIME 2 SENSITIVE Sensitive     CEFTAZIDIME >=64 RESISTANT Resistant     CEFTRIAXONE >=64 RESISTANT Resistant     CIPROFLOXACIN <=0.25 SENSITIVE Sensitive     GENTAMICIN <=1 SENSITIVE Sensitive  IMIPENEM <=0.25 SENSITIVE Sensitive     TRIMETH/SULFA <=20 SENSITIVE Sensitive     PIP/TAZO >=128 RESISTANT Resistant     * ENTEROBACTER CLOACAE   Klebsiella pneumoniae - MIC*    AMPICILLIN 16 RESISTANT Resistant     CEFAZOLIN <=4 SENSITIVE Sensitive     CEFEPIME <=1 SENSITIVE Sensitive     CEFTAZIDIME <=1 SENSITIVE Sensitive     CEFTRIAXONE <=1 SENSITIVE Sensitive     CIPROFLOXACIN <=0.25 SENSITIVE Sensitive     GENTAMICIN <=1 SENSITIVE Sensitive     IMIPENEM <=0.25 SENSITIVE Sensitive     TRIMETH/SULFA <=20 SENSITIVE Sensitive     AMPICILLIN/SULBACTAM <=2 SENSITIVE Sensitive     PIP/TAZO <=4 SENSITIVE Sensitive     * KLEBSIELLA PNEUMONIAE  Culture, blood (routine x 2)     Status: None   Collection Time: 12/07/15  8:49 PM  Result Value Ref Range Status   Specimen Description BLOOD RIGHT ANTECUBITAL  Final   Special Requests BOTTLES DRAWN AEROBIC AND ANAEROBIC 5CC   Final   Culture  Setup Time   Final    GRAM NEGATIVE RODS ANAEROBIC BOTTLE ONLY CRITICAL RESULT CALLED TO, READ BACK BY AND VERIFIED WITH: B. BLAKE,RN AT 0909 ON [id number] BY Lucienne Capers    Culture   Final    KLEBSIELLA PNEUMONIAE SUSCEPTIBILITIES PERFORMED ON PREVIOUS CULTURE WITHIN THE LAST 5 DAYS.    Report Status 12/12/2015 FINAL  Final  Urine culture     Status: None   Collection Time: 12/07/15  9:18 PM  Result Value Ref Range Status   Specimen Description URINE, RANDOM  Final   Special Requests NONE  Final   Culture NO GROWTH 1 DAY  Final   Report Status 12/08/2015 FINAL  Final  MRSA PCR Screening     Status: None   Collection Time: 12/09/15 10:14 AM    Result Value Ref Range Status   MRSA by PCR NEGATIVE NEGATIVE Final    Comment:        The GeneXpert MRSA Assay (FDA approved for NASAL specimens only), is one component of a comprehensive MRSA colonization surveillance program. It is not intended to diagnose MRSA infection nor to guide or monitor treatment for MRSA infections.   Culture, blood (routine x 2)     Status: None (Preliminary result)   Collection Time: 12/12/15 12:48 AM  Result Value Ref Range Status   Specimen Description BLOOD RIGHT ARM  Final   Special Requests AEROBIC BOTTLE ONLY  Final   Culture NO GROWTH 1 DAY  Final   Report Status PENDING  Incomplete  Culture, blood (routine x 2)     Status: None (Preliminary result)   Collection Time: 12/12/15 12:54 AM  Result Value Ref Range Status   Specimen Description BLOOD RIGHT ARM  Final   Special Requests AEROBIC BOTTLE ONLY  Final   Culture NO GROWTH 1 DAY  Final   Report Status PENDING  Incomplete     Studies: Nm Hepatobiliary Liver Func  12/14/2015  CLINICAL DATA:  History of hepatic abscess. Question of subacute cholecystitis associated with abscess. EXAM: NUCLEAR MEDICINE HEPATOBILIARY IMAGING TECHNIQUE: Sequential images of the abdomen were obtained out to 60 minutes following intravenous administration of radiopharmaceutical. RADIOPHARMACEUTICALS:  5.4 mCi Tc-63m  Choletec IV COMPARISON:  Ultrasound 12/10/2015 FINDINGS: Initial imaging is performed following administration Choletec. After 45 minutes, of gallbladder is not visualized. Morphine was administered. Additional imaging is performed, failing to reveal gallbladder activity. IMPRESSION: Findings are consistent with acute cholecystitis. Electronically  Signed   By: Norva Pavlov M.D.   On: 12/14/2015 12:41    Scheduled Meds: . antiseptic oral rinse  7 mL Mouth Rinse q12n4p  . chlorhexidine  15 mL Mouth Rinse BID  . ciprofloxacin  400 mg Intravenous Q12H  . lactulose  30 g Oral TID  .  morphine        Continuous Infusions: . sodium chloride 50 mL/hr at 12/12/15 1754     Time spent: 25 minutes  Dovid Bartko A  [facility] Pager [phone] . If 7PM-7AM, please contact night-coverage at [email or web address], password [facility] 12/14/2015, 2:54 PM  LOS: 6 days

## 2015-12-15 ENCOUNTER — Inpatient Hospital Stay (HOSPITAL_COMMUNITY): Payer: Medicare Other

## 2015-12-15 LAB — BASIC METABOLIC PANEL
Anion gap: 7 (ref 5–15)
BUN: 6 mg/dL (ref 6–20)
CALCIUM: 7.6 mg/dL — AB (ref 8.9–10.3)
CHLORIDE: 105 mmol/L (ref 101–111)
CO2: 21 mmol/L — AB (ref 22–32)
CREATININE: 0.48 mg/dL — AB (ref 0.61–1.24)
GFR calc non Af Amer: >60 mL/min (ref >60)
GLUCOSE: 112 mg/dL — AB (ref 65–99)
Potassium: 3.8 mmol/L (ref 3.5–5.1)
Sodium: 133 mmol/L — ABNORMAL LOW (ref 135–145)

## 2015-12-15 LAB — PROTIME-INR
INR: 1.25 (ref 0.00–1.49)
Prothrombin Time: 15.8 seconds — ABNORMAL HIGH (ref 11.6–15.2)

## 2015-12-15 LAB — CBC
HEMATOCRIT: 38.7 % — AB (ref 39.0–52.0)
HEMOGLOBIN: 13.2 g/dL (ref 13.0–17.0)
MCH: 32.5 pg (ref 26.0–34.0)
MCHC: 34.1 g/dL (ref 30.0–36.0)
MCV: 95.3 fL (ref 78.0–100.0)
Platelets: 329 10*3/uL (ref 150–400)
RBC: 4.06 MIL/uL — ABNORMAL LOW (ref 4.22–5.81)
RDW: 15.5 % (ref 11.5–15.5)
WBC: 11.9 10*3/uL — ABNORMAL HIGH (ref 4.0–10.5)

## 2015-12-15 LAB — APTT: APTT: 37 s (ref 24–37)

## 2015-12-15 MED ORDER — IOHEXOL 300 MG/ML  SOLN
50.0000 mL | Freq: Once | INTRAMUSCULAR | Status: AC | PRN
  Administered 2015-12-15: 5 mL via INTRAVENOUS

## 2015-12-15 MED ORDER — ENOXAPARIN SODIUM 40 MG/0.4ML ~~LOC~~ SOLN
40.0000 mg | SUBCUTANEOUS | Status: DC
  Administered 2015-12-16 – 2015-12-19 (×4): 40 mg via SUBCUTANEOUS
  Filled 2015-12-15 (×4): qty 0.4

## 2015-12-15 MED ORDER — FENTANYL CITRATE (PF) 100 MCG/2ML IJ SOLN
INTRAMUSCULAR | Status: AC | PRN
Start: 2015-12-15 — End: 2015-12-15
  Administered 2015-12-15: 25 ug via INTRAVENOUS

## 2015-12-15 MED ORDER — CEFAZOLIN SODIUM-DEXTROSE 2-3 GM-% IV SOLR
2.0000 g | INTRAVENOUS | Status: AC
  Filled 2015-12-15: qty 50

## 2015-12-15 MED ORDER — MIDAZOLAM HCL 2 MG/2ML IJ SOLN
INTRAMUSCULAR | Status: AC | PRN
  Administered 2015-12-15: 0.5 mg via INTRAVENOUS

## 2015-12-15 MED ORDER — LIDOCAINE HCL 1 % IJ SOLN
INTRAMUSCULAR | Status: AC
  Filled 2015-12-15: qty 20

## 2015-12-15 MED ORDER — MIDAZOLAM HCL 2 MG/2ML IJ SOLN
INTRAMUSCULAR | Status: AC
  Filled 2015-12-15: qty 2

## 2015-12-15 MED ORDER — FENTANYL CITRATE (PF) 100 MCG/2ML IJ SOLN
INTRAMUSCULAR | Status: AC
  Filled 2015-12-15: qty 2

## 2015-12-15 NOTE — Sedation Documentation (Signed)
Patient is resting comfortably. No complaints of pain at this time. Vital signs stable. 

## 2015-12-15 NOTE — Sedation Documentation (Signed)
Patient is resting comfortably. No complaints of pain at this time, vital signs stable. 

## 2015-12-15 NOTE — Progress Notes (Signed)
Utilization review completed.  

## 2015-12-15 NOTE — Progress Notes (Signed)
PROGRESS NOTE  Howard Rhodes ZOX:[id number] DOB: Jun 02, 1931 DOA: 12/07/2015 PCP: Ailene Ravel, MD  HPI/Recap of past 24 hours: Patient is an 82 year old male with past medical history of depression, stroke which has left him paraplegic who was brought in by wife on night of 2/12 after he had been confused and disoriented times several days as well as noted to be diaphoretic. The emergency room, patient can have white count of 28, lactic acid level 6.24, febrile and hypotensive which initially required fluids and then briefly pressors. Chest x-ray noted multi lobar pneumonia.   Patient admitted to the hospital service for septic shock. Prior to leaving the emergency room, able to be weaned off pressor support and maintain stable blood pressures.  Follow-up lactic acid level down to 2.  2/2 bottles of blood cultures growing out gram-negative rods. Urinalysis negative  Patient alert , more verbal , able to say his name. Denies pain   Assessment/Plan:  Liver abscess, Gram-negative Septic shock (HCC), klebsiella, enteobacter Bacteremia  Patient meets criteria for septic shock on admission given hypotension persisting despite fluid support and briefly requiring pressor support, lactic acidosis, marked leukocytosis, tachypnea and tachycardia with pulmonary source noted on chest x-ray. Change Antibiotics to cipro, both organism sensitive to cipro.  Follow  Repeated Blood culture drawn 2-17. No growth to date.  RUQ [place] complex cyst in the liver, adjacent to gall bladder. Will get CT abdomen to better evaluate.  Will continue with IV ciprofloxacin. Day 7 antibiotics. He will need probably 2 weeks of IV antibiotics and subsequently 2 or 4 weeks of oral antibiotics.  -CT consistent with 2 liver abscess. CT pelvis negative for diverticulitis.  -S/P percutaneous drainage of liver abscess 2-18.  -HIDA scan positive  for cholecystitis. Surgery consulted, recommendation might be cholecystostomy tube.  -will  follow surgery  recommendations , will need to ask IR to evaluate , maybe liver drainage might connect with gallbladder.  -culture from liver abscess pending.   Multilobar, broncho-PNA Continue with cipro.   Encephalopathy; ammonia level elevated Continue with  lactulose  Suspect related to acute illness , liver abscess.  Improved today, Patient is more verbal, he said: She is my wife, he said his name.   Hyperbilirubinemia: secondary to liver abscess; Elevated ammonia level. US showed complex cyst liver.  CT abdomen showed liver abscess.  Bili trending down. Ammonia trending down.   Dysphagia; on dysphagia 3 diet nectar thick.   Stroke (cerebrum) [facility]): At baseline  Hypokalemia:Replacing. Mg normal   Code Status: DNR  Family Communication: POA [name] updated 2-18, son updated 2-19 .   Disposition Plan: Likely here for several days    Consultants:  None   Procedures:  None   Antibiotics:  IV Zosyn and vancomycin 2/13-present --- cipro    Objective: BP 122/82 mmHg  Pulse 84  Temp(Src) 99 F (37.2 C) (Oral)  Resp 20  Wt 86.637 kg (191 lb)  SpO2 97%  Intake/Output Summary (Last 24 hours) at 12/15/15 1604 Last data filed at 12/15/15 8119  Gross per 24 hour  Intake    360 ml  Output     40 ml  Net    320 ml   Filed Weights   12/12/15 1305  Weight: 86.637 kg (191 lb)    Exam:   General:   Alert, non verbal.   Cardiovascular: Regular rate and rhythm, S1-S2   Respiratory: Few rhonchi  Abdomen: Soft, obese, nontender, hypoactive bowel sounds   Musculoskeletal: Trace pitting edema bilaterally  Data Reviewed: Basic Metabolic Panel:  Recent Labs Lab 12/09/15 1615  12/11/15 0350 12/12/15 0117 12/13/15 0230 12/14/15 0214 12/15/15 0546  NA  --   < > 142 141 138 137 133*  K  --   < > 3.3* 4.3 3.5 3.8 3.8  CL  --   < > 108 110 108 105 105  CO2  --   < > 24 20* 21* 21* 21*  GLUCOSE  --   < > 147* 149* 98 107* 112*  BUN  --   < > CREATININE  --   < > 0.59* 0.53* 0.51* 0.53* 0.48*  CALCIUM  --   < > 8.0* 8.2* 7.9* 7.7* 7.6*  MG 2.0  --   --   --   --   --   --   < > = values in this interval not displayed. Liver Function Tests:  Recent Labs Lab 12/10/15 0340 12/11/15 0350 12/12/15 0117 12/14/15 0214  AST 42* 37 58* 45*  ALT 53 46 52 44  ALKPHOS 71 80 90 80  BILITOT 3.7* 3.1* 2.6* 2.7*  PROT 5.0* 5.0* 5.1* 4.9*  ALBUMIN 2.0* 1.8* 1.8* 1.6*   No results for input(s): LIPASE, AMYLASE in the last 168 hours.  Recent Labs Lab 12/10/15 1542 12/11/15 0350 12/12/15 0121 12/14/15 0214  AMMONIA 62* 56* 65* 44*   CBC:  Recent Labs Lab 12/10/15 0340 12/11/15 0350 12/13/15 0230 12/14/15 0214 12/15/15 0546  WBC 15.4* 14.8* 13.7* 13.7* 11.9*  HGB 14.0 12.9* 12.4* 12.3* 13.2  HCT 40.7 37.8* 37.0* 36.2* 38.7*  MCV 92.9 93.3 94.1 94.0 95.3  PLT 124* 126* 215 233 329   Cardiac Enzymes:   No results for input(s): CKTOTAL, CKMB, CKMBINDEX, TROPONINI in the last 168 hours. BNP (last 3 results)  Recent Labs  04/09/15 2133  BNP 118.4*    ProBNP (last 3 results) No results for input(s): PROBNP in the last 8760 hours.  CBG:  Recent Labs Lab 12/13/15 1303  GLUCAP 88    Recent Results (from the past 240 hour(s))  Culture, blood (routine x 2)     Status: None   Collection Time: 12/07/15  8:44 PM  Result Value Ref Range Status   Specimen Description BLOOD RIGHT ANTECUBITAL  Final   Special Requests BOTTLES DRAWN AEROBIC AND ANAEROBIC 5CC   Final   Culture  Setup Time   Final    GRAM NEGATIVE RODS IN BOTH AEROBIC AND ANAEROBIC BOTTLES CRITICAL RESULT CALLED TO, READ BACK BY AND VERIFIED WITH: Lydia Guiles 12/08/15 @ 1259 M VESTAL    Culture ENTEROBACTER CLOACAE KLEBSIELLA PNEUMONIAE   Final   Report Status 12/10/2015 FINAL  Final   Organism ID, Bacteria ENTEROBACTER CLOACAE  Final   Organism ID, Bacteria KLEBSIELLA PNEUMONIAE  Final      Susceptibility   Enterobacter cloacae - MIC*     CEFAZOLIN >=64 RESISTANT Resistant     CEFEPIME 2 SENSITIVE Sensitive     CEFTAZIDIME >=64 RESISTANT Resistant     CEFTRIAXONE >=64 RESISTANT Resistant     CIPROFLOXACIN <=0.25 SENSITIVE Sensitive     GENTAMICIN <=1 SENSITIVE Sensitive     IMIPENEM <=0.25 SENSITIVE Sensitive     TRIMETH/SULFA <=20 SENSITIVE Sensitive     PIP/TAZO >=128 RESISTANT Resistant     * ENTEROBACTER CLOACAE   Klebsiella pneumoniae - MIC*    AMPICILLIN 16 RESISTANT Resistant     CEFAZOLIN <=4 SENSITIVE Sensitive  CEFEPIME <=1 SENSITIVE Sensitive     CEFTAZIDIME <=1 SENSITIVE Sensitive     CEFTRIAXONE <=1 SENSITIVE Sensitive     CIPROFLOXACIN <=0.25 SENSITIVE Sensitive     GENTAMICIN <=1 SENSITIVE Sensitive     IMIPENEM <=0.25 SENSITIVE Sensitive     TRIMETH/SULFA <=20 SENSITIVE Sensitive     AMPICILLIN/SULBACTAM <=2 SENSITIVE Sensitive     PIP/TAZO <=4 SENSITIVE Sensitive     * KLEBSIELLA PNEUMONIAE  Culture, blood (routine x 2)     Status: None   Collection Time: 12/07/15  8:49 PM  Result Value Ref Range Status   Specimen Description BLOOD RIGHT ANTECUBITAL  Final   Special Requests BOTTLES DRAWN AEROBIC AND ANAEROBIC 5CC   Final   Culture  Setup Time   Final    GRAM NEGATIVE RODS ANAEROBIC BOTTLE ONLY CRITICAL RESULT CALLED TO, READ BACK BY AND VERIFIED WITH: B. BLAKE,RN AT 0909 ON [id number] BY Lucienne Capers    Culture   Final    KLEBSIELLA PNEUMONIAE SUSCEPTIBILITIES PERFORMED ON PREVIOUS CULTURE WITHIN THE LAST 5 DAYS.    Report Status 12/12/2015 FINAL  Final  Urine culture     Status: None   Collection Time: 12/07/15  9:18 PM  Result Value Ref Range Status   Specimen Description URINE, RANDOM  Final   Special Requests NONE  Final   Culture NO GROWTH 1 DAY  Final   Report Status 12/08/2015 FINAL  Final  MRSA PCR Screening     Status: None   Collection Time: 12/09/15 10:14 AM  Result Value Ref Range Status   MRSA by PCR NEGATIVE NEGATIVE Final    Comment:        The GeneXpert MRSA Assay  (FDA approved for NASAL specimens only), is one component of a comprehensive MRSA colonization surveillance program. It is not intended to diagnose MRSA infection nor to guide or monitor treatment for MRSA infections.   Culture, blood (routine x 2)     Status: None (Preliminary result)   Collection Time: 12/12/15 12:48 AM  Result Value Ref Range Status   Specimen Description BLOOD RIGHT ARM  Final   Special Requests AEROBIC BOTTLE ONLY  Final   Culture NO GROWTH 3 DAYS  Final   Report Status PENDING  Incomplete  Culture, blood (routine x 2)     Status: None (Preliminary result)   Collection Time: 12/12/15 12:54 AM  Result Value Ref Range Status   Specimen Description BLOOD RIGHT ARM  Final   Special Requests AEROBIC BOTTLE ONLY  Final   Culture NO GROWTH 3 DAYS  Final   Report Status PENDING  Incomplete  Culture, routine-abscess     Status: None (Preliminary result)   Collection Time: 12/13/15 10:35 AM  Result Value Ref Range Status   Specimen Description ABSCESS  Final   Special Requests LIVER  Final   Gram Stain   Final    ABUNDANT WBC PRESENT,BOTH PMN AND MONONUCLEAR FEW SQUAMOUS EPITHELIAL CELLS PRESENT NO ORGANISMS SEEN Performed at [facility]    Culture   Final    Culture reincubated for better growth Performed at [facility]    Report Status PENDING  Incomplete     Studies: No results found.  Scheduled Meds: . antiseptic oral rinse  7 mL Mouth Rinse q12n4p  .  ceFAZolin (ANCEF) IV  2 g Intravenous to XRAY  . chlorhexidine  15 mL Mouth Rinse BID  . ciprofloxacin  400 mg Intravenous Q12H  . fentaNYL      .  lactulose  30 g Oral TID  . lidocaine      . midazolam        Continuous Infusions: . sodium chloride 50 mL/hr at 12/15/15 1115     Time spent: 25 minutes  Landynn Dupler A  [facility] Pager [phone] . If 7PM-7AM, please contact night-coverage at [email or web address], password [facility] 12/15/2015, 4:04 PM  LOS: 7 days

## 2015-12-15 NOTE — Consult Note (Signed)
Chief Complaint: Patient was seen in consultation today for percutaneous cholecystostomy drain pplacement Chief Complaint  Patient presents with  . Altered Mental Status   at the request of Dr Elwyn Lade  Referring Physician(s): CCS   History of Present Illness: Howard Rhodes is a 82 y.o. male   Hepatic abscess drain placed 2/18 in IR Pt with continued abd pain Elevated LFTs HIDA without visualization of GB Request made for percutaneous cholecystostomy drain placement per Dr Leonard Schwartz Janee Morn Dr [address] has reviewed imaging and approves procedure  Past Medical History  Diagnosis Date  . HLD (hyperlipidemia)   . Arthritis   . Anemia   . Depression   . Hip fracture (HCC)   . Stroke [facility])     Past Surgical History  Procedure Laterality Date  . Hernia repair    . Femur im nail Left 03/09/2013    Procedure: INTRAMEDULLARY (IM) NAIL FEMORAL;  Surgeon: Verlee Rossetti, MD;  Location: [facility];  Service: Orthopedics;  Laterality: Left;    Allergies: Review of patient's allergies indicates no known allergies.  Medications: Prior to Admission medications   Medication Sig Start Date End Date Taking? Authorizing Provider  atorvastatin (LIPITOR) 40 MG tablet Take 40 mg by mouth daily at 6 PM.   Yes Historical Provider, MD  furosemide (LASIX) 20 MG tablet Take 1 tablet (20 mg total) by mouth daily. 04/18/15  Yes Dorothea Ogle, MD  cefUROXime (CEFTIN) 500 MG tablet Take 1 tablet (500 mg total) by mouth 2 (two) times daily with a meal. Patient not taking: Reported on 12/07/2015 04/18/15   Dorothea Ogle, MD  HYDROcodone-acetaminophen (NORCO/VICODIN) 5-325 MG per tablet Take 1 tablet by mouth every 6 (six) hours as needed for pain. Patient not taking: Reported on 12/07/2015 04/16/13   Vassie Loll, MD  levalbuterol [facility]) 0.63 MG/3ML nebulizer solution Take 3 mLs (0.63 mg total) by nebulization every 6 (six) hours as needed for wheezing or shortness of breath. Patient not taking: Reported on  12/07/2015 04/18/15   Dorothea Ogle, MD     Family History  Problem Relation Age of Onset  . CAD Brother     Social History   Social History  . Marital Status: Married    Spouse Name: N/A  . Number of Children: N/A  . Years of Education: N/A   Social History Main Topics  . Smoking status: Former Smoker -- 10 years    Types: Cigarettes, Cigars  . Smokeless tobacco: None  . Alcohol Use: No  . Drug Use: No  . Sexual Activity: Not Asked   Other Topics Concern  . None   Social History Narrative     Review of Systems: A 12 point ROS discussed and pertinent positives are indicated in the HPI above.  All other systems are negative.  Review of Systems  Constitutional: Positive for activity change, appetite change and fatigue. Negative for fever.  Respiratory: Negative for cough.   Cardiovascular: Negative for chest pain.  Gastrointestinal: Positive for abdominal pain.  Neurological: Positive for weakness.  Psychiatric/Behavioral: Positive for confusion.    Vital Signs: BP 99/54 mmHg  Pulse 90  Temp(Src) 98.4 F (36.9 C) (Axillary)  Resp 18  Wt 191 lb (86.637 kg)  SpO2 94%  Physical Exam  Cardiovascular: Normal rate.   Pulmonary/Chest: Effort normal and breath sounds normal.  Abdominal: Soft. Bowel sounds are normal. There is tenderness.  Musculoskeletal: Normal range of motion.  Neurological: He is alert.  confusion  Skin:  Skin is warm and dry.  Psychiatric:  Consented with wife via phone  Nursing note and vitals reviewed.   Mallampati Score:  MD Evaluation Airway: WNL Heart: WNL Abdomen: WNL Chest/ Lungs: WNL ASA  Classification: 3 Mallampati/Airway Score: Two  Imaging: Ct Pelvis Wo Contrast  12/12/2015  CLINICAL DATA:  82 year old male with hepatic abscess seen on earlier study. Delayed images through the pelvis performed to evaluate for possible diverticulitis. EXAM: CT PELVIS WITHOUT CONTRAST TECHNIQUE: Multidetector CT imaging of the pelvis was  performed following the standard protocol without intravenous contrast. COMPARISON:  Earlier CT dated 12/12/2015 FINDINGS: No free air or free fluid identified within the pelvis. There is mild thickening and trabecular appearance of the bladder the, likely related to a degree of chronic bladder outlet obstruction. The prostate gland is grossly unremarkable. There are scattered sigmoid diverticula without active inflammatory changes. Normal appendix. No dilated bowel loops identified within the pelvis. There is aortoiliac atherosclerotic disease. There is a 3 cm aneurysmal dilatation of the distal aorta. No adenopathy identified within the pelvis. Small fat containing umbilical hernia. There is osteopenia with extensive degenerative changes of the spine. Left hip intra medullary orthopedic hardware and ectopic ossification. No acute fracture. IMPRESSION: Sigmoid diverticulosis. A 3 cm distal abdominal aortic aneurysm. Recommend followup by ultrasound in 3 years. This recommendation follows ACR consensus guidelines: White Paper of the ACR Incidental Findings Committee II on Vascular Findings. J Am Coll Radiol 2013; 16:109-604 Electronically Signed   By: Elgie Collard M.D.   On: 12/12/2015 21:05   Ct Angio Abdomen W/cm &/or Wo Contrast  12/12/2015  CLINICAL DATA:  Evaluate liver cyst on ultrasound EXAM: CT ANGIOGRAPHY ABDOMEN TECHNIQUE: Multidetector CT imaging of the abdomen was performed using the standard protocol during bolus administration of intravenous contrast. Multiplanar reconstructed images including MIPs were obtained and reviewed to evaluate the vascular anatomy. CONTRAST:  OMNIPAQUE IOHEXOL 300 MG/ML  SOLN COMPARISON:  None. FINDINGS: Fusiform ectasia of the infrarenal abdominal aorta, measuring 2.9 x 2.5 cm (series 3/image 89). No evidence of abdominal aortic aneurysm. Atherosclerotic calcifications of the abdominal aorta. Lower chest: Trace bilateral pleural effusions with associated mild  dependent atelectasis in the bilateral lower lobes. Hepatobiliary: 5.2 x 6.1 x 4.7 cm complex cystic lesion in the anterior right hepatic dome (series 7/ image 36). Additional 4.7 x 6.2 x 6.2 cm complex cystic lesion inferiorly in the right hepatic lobe (series 7/image 50). This appearance is worrisome for multifocal hepatic abscesses. Additional scattered smaller cystic lesions measuring 11 mm in the medial segment left hepatic lobe (series 7/ image 33) and 13 mm posteriorly in the medial segment left hepatic lobe (series 7/ image 29) are indeterminate. Gallbladder is mildly thick-walled. Subtle discontinuity along the posterior gallbladder wall is not excluded (series 3/ image 45), possibly reflecting the etiology of the hepatic abscesses, although the relative lack gallbladder inflammation is unusual given the size of the liver involvement. No intrahepatic or extrahepatic ductal dilatation. Pancreas: Within normal limits. Spleen: Within normal limits. Adrenals/Urinary Tract: Adrenal glands are within normal limits. Kidneys are within normal limits.  No hydronephrosis. Stomach/Bowel: Stomach is within normal limits. Visualized bowel is unremarkable, noting colonic diverticulosis. Vascular/Lymphatic: Vascular findings as above. No suspicious abdominal lymphadenopathy. 14 mm short axis portacaval node (series 3/image 56), within the upper limits of normal. Other: No abdominal ascites. Musculoskeletal: Degenerative changes of the visualized thoracolumbar spine. Mild anterior wedging at T8. Review of the MIP images confirms the above findings. IMPRESSION: Two suspected hepatic abscesses measuring  up to 6.2 cm in the right hepatic lobe, as above. Possible discontinuity along the posterior gallbladder wall, equivocal. While this may be the source of the hepatic abscesses, the relative lack of gallbladder inflammation is unusual. Consider CT pelvis to evaluate for additional etiologies including diverticulitis. These  results were called by telephone at the time of interpretation on 12/12/2015 at 5:20 pm to Dr. Hartley Barefoot , who verbally acknowledged these results. Electronically Signed   By: Charline Bills M.D.   On: 12/12/2015 17:23   Nm Hepatobiliary Liver Func  12/14/2015  CLINICAL DATA:  History of hepatic abscess. Question of subacute cholecystitis associated with abscess. EXAM: NUCLEAR MEDICINE HEPATOBILIARY IMAGING TECHNIQUE: Sequential images of the abdomen were obtained out to 60 minutes following intravenous administration of radiopharmaceutical. RADIOPHARMACEUTICALS:  5.4 mCi Tc-6m  Choletec IV COMPARISON:  Ultrasound 12/10/2015 FINDINGS: Initial imaging is performed following administration Choletec. After 45 minutes, of gallbladder is not visualized. Morphine was administered. Additional imaging is performed, failing to reveal gallbladder activity. IMPRESSION: Findings are consistent with acute cholecystitis. Electronically Signed   By: Norva Pavlov M.D.   On: 12/14/2015 12:41   Ir Guided Horace Porteous W Catheter Placement  12/13/2015  INDICATION: Liver abscess EXAM: ABSCESS DRAINAGE; IR ULTRASOUND GUIDANCE MEDICATIONS: The patient is currently admitted to the hospital and receiving intravenous antibiotics. The antibiotics were administered within an appropriate time frame prior to the initiation of the procedure. ANESTHESIA/SEDATION: Fentanyl 1 mcg IV; Versed 50 mg IV Moderate Sedation Time:  13 The patient was continuously monitored during the procedure by the interventional radiology nurse under my direct supervision. COMPLICATIONS: None immediate. PROCEDURE: Informed written consent was obtained from the patient after a thorough discussion of the procedural risks, benefits and alternatives. All questions were addressed. Maximal Sterile Barrier Technique was utilized including caps, mask, sterile gowns, sterile gloves, sterile drape, hand hygiene and skin antiseptic. A timeout was performed prior to the  initiation of the procedure. Under sonographic guidance, a 21 gauge needle was inserted into the liver abscess via inter costal approach. It was removed over a 018 wire which was up sized to a 3 J. a 10 [place] dilator followed by a 10 [place] drain were inserted into the abscess. It was looped and string fixed. It was sewn to the skin. Contrast was injected. 20 cc of pus was aspirated. FINDINGS: Imaging documents placement of a 10 French drain in a liver abscess. IMPRESSION: Successful 10 French hepatic abscess strain yielding pus. Electronically Signed   By: Jolaine Click M.D.   On: 12/13/2015 13:17   Ir US Guide Bx Asp/drain  12/13/2015  INDICATION: Liver abscess EXAM: ABSCESS DRAINAGE; IR ULTRASOUND GUIDANCE MEDICATIONS: The patient is currently admitted to the hospital and receiving intravenous antibiotics. The antibiotics were administered within an appropriate time frame prior to the initiation of the procedure. ANESTHESIA/SEDATION: Fentanyl 1 mcg IV; Versed 50 mg IV Moderate Sedation Time:  13 The patient was continuously monitored during the procedure by the interventional radiology nurse under my direct supervision. COMPLICATIONS: None immediate. PROCEDURE: Informed written consent was obtained from the patient after a thorough discussion of the procedural risks, benefits and alternatives. All questions were addressed. Maximal Sterile Barrier Technique was utilized including caps, mask, sterile gowns, sterile gloves, sterile drape, hand hygiene and skin antiseptic. A timeout was performed prior to the initiation of the procedure. Under sonographic guidance, a 21 gauge needle was inserted into the liver abscess via inter costal approach. It was removed over a 018 wire which was  up sized to a 3 J. a 10 [place] dilator followed by a 10 [place] drain were inserted into the abscess. It was looped and string fixed. It was sewn to the skin. Contrast was injected. 20 cc of pus was aspirated. FINDINGS: Imaging  documents placement of a 10 French drain in a liver abscess. IMPRESSION: Successful 10 French hepatic abscess strain yielding pus. Electronically Signed   By: Jolaine Click M.D.   On: 12/13/2015 13:17   Dg Chest Port 1 View  12/07/2015  CLINICAL DATA:  82 year old presenting with 2 day history of fever and acute mental status changes. Patient was hypoxic upon EMS arrival. EXAM: PORTABLE CHEST 1 VIEW COMPARISON:  CT chest 04/15/2015 and earlier. Chest x-rays 04/14/2015 and earlier. FINDINGS: Markedly suboptimal inspiration. Streaky and patchy airspace opacity in the lung bases and in the right upper lobe, more than expected with just atelectasis due to low volumes. Cardiac silhouette mildly to moderately enlarged for technique and degree of inspiration, unchanged. Mild pulmonary venous hypertension without overt edema. IMPRESSION: Markedly suboptimal inspiration. Multilobar bronchopneumonia suspected involving both lung bases and the right upper lobe. Electronically Signed   By: Hulan Saas M.D.   On: 12/07/2015 21:33   Dg Swallowing Func-speech Pathology  12/10/2015  Objective Swallowing Evaluation: Type of Study: MBS-Modified Barium Swallow Study Patient Details Name: Howard Rhodes MRN: [id number] Date of Birth: [birth year] Today's Date: 12/10/2015 Time: SLP Start Time (ACUTE ONLY): 0859-SLP Stop Time (ACUTE ONLY): 0919 SLP Time Calculation (min) (ACUTE ONLY): 20 min Past Medical History: Past Medical History Diagnosis Date . HLD (hyperlipidemia)  . Arthritis  . Anemia  . Depression  . Hip fracture (HCC)  . Stroke [facility])  Past Surgical History: Past Surgical History Procedure Laterality Date . Hernia repair   . Femur im nail Left 03/09/2013   Procedure: INTRAMEDULLARY (IM) NAIL FEMORAL;  Surgeon: Verlee Rossetti, MD;  Location: [facility];  Service: Orthopedics;  Laterality: Left; HPI: 82 year old male with past medical history of depression, stroke which has left him paraplegic who was brought in by wife on night of  2/12 after he had been confused and disoriented for several days as well as noted to be diaphoretic.  Dx sepsis secondary to CAP, acute encephalopathy. Followed by SLP services June 2016 admission - concerns for choking incidents with dry solids; he was D/Cd on a dysphagia 3 diet with thin liquids.  Subjective: alert, looks at examiner but responds intermittently Assessment / Plan / Recommendation CHL IP CLINICAL IMPRESSIONS 12/10/2015 Therapy Diagnosis Mild pharyngeal phase dysphagia;Moderate pharyngeal phase dysphagia Clinical Impression Pt exhibited mild-mod pharyngeal phase dysphagia, characterized by delayed swallow initiation to the pyriform sinuses. This resulted in aspiration before the swallow during intake of thin liquids via cup with 5 second delayed cough . During intake of all other consistencies, oropharyngeal swallow appeared within functional limits. No mastication impairment or difficulty with straws observed, however due to pt's cognitive deficits regular texture and straws are not recommended as pt unlikely to sense aspiration. Recommend Dysphagia 3 (mechanical soft) diet, nectar thick liquids (no straws), meds whole in puree and full assist/supervision during meals. Pt educated re: diet recommendation. SLP will f/u to determine diet toleration. Impact on safety and function Moderate aspiration risk;Severe aspiration risk   CHL IP TREATMENT RECOMMENDATION 12/10/2015 Treatment Recommendations Therapy as outlined in treatment plan below   Prognosis 12/10/2015 Prognosis for Safe Diet Advancement Fair Barriers to Reach Goals Cognitive deficits;Severity of deficits Barriers/Prognosis Comment -- CHL IP DIET RECOMMENDATION 12/10/2015 SLP Diet  Recommendations Dysphagia 3 (Mech soft) solids;Nectar thick liquid Liquid Administration via Cup;No straw Medication Administration Whole meds with puree Compensations Minimize environmental distractions;Slow rate;Small sips/bites Postural Changes Seated upright at 90  degrees   CHL IP OTHER RECOMMENDATIONS 12/10/2015 Recommended Consults -- Oral Care Recommendations Oral care BID Other Recommendations Order thickener from pharmacy   CHL IP FOLLOW UP RECOMMENDATIONS 12/10/2015 Follow up Recommendations (No Data)   CHL IP FREQUENCY AND DURATION 12/10/2015 Speech Therapy Frequency (ACUTE ONLY) min 2x/week Treatment Duration 2 weeks      CHL IP ORAL PHASE 12/10/2015 Oral Phase WFL Oral - Pudding Teaspoon -- Oral - Pudding Cup -- Oral - Honey Teaspoon -- Oral - Honey Cup -- Oral - Nectar Teaspoon -- Oral - Nectar Cup -- Oral - Nectar Straw -- Oral - Thin Teaspoon -- Oral - Thin Cup -- Oral - Thin Straw -- Oral - Puree -- Oral - Mech Soft -- Oral - Regular -- Oral - Multi-Consistency -- Oral - Pill -- Oral Phase - Comment --  CHL IP PHARYNGEAL PHASE 12/10/2015 Pharyngeal Phase Impaired Pharyngeal- Pudding Teaspoon -- Pharyngeal -- Pharyngeal- Pudding Cup -- Pharyngeal -- Pharyngeal- Honey Teaspoon -- Pharyngeal -- Pharyngeal- Honey Cup -- Pharyngeal -- Pharyngeal- Nectar Teaspoon -- Pharyngeal -- Pharyngeal- Nectar Cup Delayed swallow initiation-vallecula Pharyngeal -- Pharyngeal- Nectar Straw WFL Pharyngeal -- Pharyngeal- Thin Teaspoon -- Pharyngeal -- Pharyngeal- Thin Cup Delayed swallow initiation-pyriform sinuses;Penetration/Aspiration before swallow;Moderate aspiration Pharyngeal Material enters airway, passes BELOW cords and not ejected out despite cough attempt by patient Pharyngeal- Thin Straw -- Pharyngeal -- Pharyngeal- Puree WFL Pharyngeal -- Pharyngeal- Mechanical Soft -- Pharyngeal -- Pharyngeal- Regular -- Pharyngeal -- Pharyngeal- Multi-consistency -- Pharyngeal -- Pharyngeal- Pill -- Pharyngeal -- Pharyngeal Comment --  CHL IP CERVICAL ESOPHAGEAL PHASE 12/10/2015 Cervical Esophageal Phase WFL Pudding Teaspoon -- Pudding Cup -- Honey Teaspoon -- Honey Cup -- Nectar Teaspoon -- Nectar Cup -- Nectar Straw -- Thin Teaspoon -- Thin Cup -- Thin Straw -- Puree -- Mechanical Soft --  Regular -- Multi-consistency -- Pill -- Cervical Esophageal Comment -- No flowsheet data found. Royce Macadamia 12/10/2015, 10:31 AM  Breck Coons Lonell Face.Ed CCC-SLP Pager [phone]             US Abdomen Limited Ruq  12/10/2015  CLINICAL DATA:  Abnormal LFTs. EXAM: US ABDOMEN LIMITED - RIGHT UPPER QUADRANT COMPARISON:  04/13/2015 FINDINGS: Gallbladder: Layering sludge noted in the lumen of the gallbladder. No gallbladder wall thickening or pericholecystic fluid. Sonographer reports no sonographic Murphy sign. Common bile duct: Diameter: 6 mm Liver: 4.5 by 4.3 x 4.8 cm complex cystic lesion identified in the inferior right liver, adjacent to the gallbladder. This was not visualized on the prior study. IMPRESSION: Complex cystic and solid lesion in the inferior right liver adjacent to the gallbladder. This lesion will require further imaging characterization as neoplasm could have this appearance. Consider CT imaging without and with contrast. Electronically Signed   By: Kennith Center M.D.   On: 12/10/2015 19:50    Labs:  CBC:  Recent Labs  12/11/15 0350 12/13/15 0230 12/14/15 0214 12/15/15 0546  WBC 14.8* 13.7* 13.7* 11.9*  HGB 12.9* 12.4* 12.3* 13.2  HCT 37.8* 37.0* 36.2* 38.7*  PLT 126* 215 233 329    COAGS:  Recent Labs  04/09/15 2133 12/08/15 0408  INR 1.35 1.34  APTT 29 37    BMP:  Recent Labs  12/12/15 0117 12/13/15 0230 12/14/15 0214 12/15/15 0546  NA 141 138 137 133*  K 4.3  3.5 3.8 3.8  CL 110 108 105 105  CO2 20* 21* 21* 21*  GLUCOSE 149* 98 107* 112*  BUN CALCIUM 8.2* 7.9* 7.7* 7.6*  CREATININE 0.53* 0.51* 0.53* 0.48*  GFRNONAA >60 >60 >60 >60  GFRAA >60 >60 >60 >60    LIVER FUNCTION TESTS:  Recent Labs  12/10/15 0340 12/11/15 0350 12/12/15 0117 12/14/15 0214  BILITOT 3.7* 3.1* 2.6* 2.7*  AST 42* 37 58* 45*  ALT 53 46 52 44  ALKPHOS 71 80 90 80  PROT 5.0* 5.0* 5.1* 4.9*  ALBUMIN 2.0* 1.8* 1.8* 1.6*    TUMOR MARKERS: No  results for input(s): AFPTM, CEA, CA199, CHROMGRNA in the last 8760 hours.  Assessment and Plan:  Previous hepatic abscess drain placed 2/18 Persistent abdominal pain HIDA - NO GB Now scheduled for perc Chole drain per CCS request Risks and Benefits discussed with the patient's wife including, but not limited to bleeding, infection, gallbladder perforation, bile leak, sepsis or even death. All of her questions were answered, she is agreeable to proceed. Consent signed and in chart.   Thank you for this interesting consult.  I greatly enjoyed meeting Howard Rhodes and look forward to participating in their care.  A copy of this report was sent to the requesting provider on this date.  Electronically Signed: Ralene Muskrat A 12/15/2015, 9:17 AM   I spent a total of 40 Minutes    in face to face in clinical consultation, greater than 50% of which was counseling/coordinating care for perc chole drain

## 2015-12-15 NOTE — Progress Notes (Signed)
Subjective: In bed, very stiff and hard just to move arm.  Drain bag is currently empty, looks bilious and thick in tube.  He does not seem to have pain right side this AM.  He can't really help with describing the pain or discomfort.  He could tell me he was at [facility] but just looked at me with other questions.    Objective: Vital signs in last 24 hours: Temp:  [98.4 F (36.9 C)-99.1 F (37.3 C)] 98.4 F (36.9 C) (02/20 0410) Pulse Rate:  [90] 90 (02/20 0410) Resp:  [18] 18 (02/19 2024) BP: (99-100)/(54) 99/54 mmHg (02/20 0410) SpO2:  [90 %-94 %] 94 % (02/20 0410) Last BM Date: 12/14/15 540 PO   Urine not recorded Drain 40 Afebrile, Tm 99.1, BP 99-110 range, HR around 90 WBC improving, Na 133 HIDA 12/14/15  _no GB visualization yesterday Intake/Output from previous day: 02/19 0701 - 02/20 0700 In: 540 [P.O.:540] Out: 40 [Drains:40] Intake/Output this shift:    General appearance: alert, cooperative, no distress and he isn't really answering questions.  Moving his right arm is a major take, he is very stiff and cannot help. Resp: clear to auscultation bilaterally and anterior exam GI: soft, no complaints of pain with any palpation of the site.  Few BS.  Lab Results:   Recent Labs  12/14/15 0214 12/15/15 0546  WBC 13.7* 11.9*  HGB 12.3* 13.2  HCT 36.2* 38.7*  PLT 233 329    BMET  Recent Labs  12/14/15 0214 12/15/15 0546  NA 137 133*  K 3.8 3.8  CL 105 105  CO2 21* 21*  GLUCOSE 107* 112*  BUN 7 6  CREATININE 0.53* 0.48*  CALCIUM 7.7* 7.6*   PT/INR No results for input(s): LABPROT, INR in the last 72 hours.   Recent Labs Lab 12/10/15 0340 12/11/15 0350 12/12/15 0117 12/14/15 0214  AST 42* 37 58* 45*  ALT 53 46 52 44  ALKPHOS 71 80 90 80  BILITOT 3.7* 3.1* 2.6* 2.7*  PROT 5.0* 5.0* 5.1* 4.9*  ALBUMIN 2.0* 1.8* 1.8* 1.6*     Lipase  No results found for: LIPASE   Studies/Results: Nm Hepatobiliary Liver Func  12/14/2015  CLINICAL DATA:   History of hepatic abscess. Question of subacute cholecystitis associated with abscess. EXAM: NUCLEAR MEDICINE HEPATOBILIARY IMAGING TECHNIQUE: Sequential images of the abdomen were obtained out to 60 minutes following intravenous administration of radiopharmaceutical. RADIOPHARMACEUTICALS:  5.4 mCi Tc-79m  Choletec IV COMPARISON:  Ultrasound 12/10/2015 FINDINGS: Initial imaging is performed following administration Choletec. After 45 minutes, of gallbladder is not visualized. Morphine was administered. Additional imaging is performed, failing to reveal gallbladder activity. IMPRESSION: Findings are consistent with acute cholecystitis. Electronically Signed   By: Norva Pavlov M.D.   On: 12/14/2015 12:41   Ir Guided Horace Porteous W Catheter Placement  12/13/2015  INDICATION: Liver abscess EXAM: ABSCESS DRAINAGE; IR ULTRASOUND GUIDANCE MEDICATIONS: The patient is currently admitted to the hospital and receiving intravenous antibiotics. The antibiotics were administered within an appropriate time frame prior to the initiation of the procedure. ANESTHESIA/SEDATION: Fentanyl 1 mcg IV; Versed 50 mg IV Moderate Sedation Time:  13 The patient was continuously monitored during the procedure by the interventional radiology nurse under my direct supervision. COMPLICATIONS: None immediate. PROCEDURE: Informed written consent was obtained from the patient after a thorough discussion of the procedural risks, benefits and alternatives. All questions were addressed. Maximal Sterile Barrier Technique was utilized including caps, mask, sterile gowns, sterile gloves, sterile drape, hand hygiene and  skin antiseptic. A timeout was performed prior to the initiation of the procedure. Under sonographic guidance, a 21 gauge needle was inserted into the liver abscess via inter costal approach. It was removed over a 018 wire which was up sized to a 3 J. a 10 [place] dilator followed by a 10 [place] drain were inserted into the abscess. It was  looped and string fixed. It was sewn to the skin. Contrast was injected. 20 cc of pus was aspirated. FINDINGS: Imaging documents placement of a 10 French drain in a liver abscess. IMPRESSION: Successful 10 French hepatic abscess strain yielding pus. Electronically Signed   By: Jolaine Click M.D.   On: 12/13/2015 13:17   Ir US Guide Bx Asp/drain  12/13/2015  INDICATION: Liver abscess EXAM: ABSCESS DRAINAGE; IR ULTRASOUND GUIDANCE MEDICATIONS: The patient is currently admitted to the hospital and receiving intravenous antibiotics. The antibiotics were administered within an appropriate time frame prior to the initiation of the procedure. ANESTHESIA/SEDATION: Fentanyl 1 mcg IV; Versed 50 mg IV Moderate Sedation Time:  13 The patient was continuously monitored during the procedure by the interventional radiology nurse under my direct supervision. COMPLICATIONS: None immediate. PROCEDURE: Informed written consent was obtained from the patient after a thorough discussion of the procedural risks, benefits and alternatives. All questions were addressed. Maximal Sterile Barrier Technique was utilized including caps, mask, sterile gowns, sterile gloves, sterile drape, hand hygiene and skin antiseptic. A timeout was performed prior to the initiation of the procedure. Under sonographic guidance, a 21 gauge needle was inserted into the liver abscess via inter costal approach. It was removed over a 018 wire which was up sized to a 3 J. a 10 [place] dilator followed by a 10 [place] drain were inserted into the abscess. It was looped and string fixed. It was sewn to the skin. Contrast was injected. 20 cc of pus was aspirated. FINDINGS: Imaging documents placement of a 10 French drain in a liver abscess. IMPRESSION: Successful 10 French hepatic abscess strain yielding pus. Electronically Signed   By: Jolaine Click M.D.   On: 12/13/2015 13:17    Medications: . antiseptic oral rinse  7 mL Mouth Rinse q12n4p  . chlorhexidine  15 mL  Mouth Rinse BID  . ciprofloxacin  400 mg Intravenous Q12H  . lactulose  30 g Oral TID    Assessment/Plan Liver abscess IR drain placement liver abscess Possible GB rupture into parenchyma Septic shock Klebsiella enterobacter bacteremia Pneumonia Encephalopathy Hyperbilirubinemia Hx of CVA Dysphagia Hypokalemia DNR Antibiotics:  3 days of Zosyn completed 12/09/15, started on Cipro 2/15 this is day 6 DVT:  Adding SCD   Plan:  We discussed this with AM signout and Dr. Janee Morn would like to get a drain for the gallbladder in addition to the liver abscess.  We will ask IR to see.  He has POA so this will take some time to arrange.       LOS: 7 days    Howard Rhodes 12/15/2015

## 2015-12-15 NOTE — Procedures (Signed)
Interventional Radiology Procedure Note  Procedure: Image guided perc cholecystostomy.  67F drain.  Complications: None Recommendations:  - Continue gravity drain - Agree with medical care - pending culture - Flush BID  Signed,  Yvone Neu. Loreta Ave, DO

## 2015-12-15 NOTE — Care Management Important Message (Signed)
Important Message  Patient Details  Name: Deeric Cruise MRN: [id number] Date of Birth: [birth year]   Medicare Important Message Given:  Yes    Bernadette Hoit 12/15/2015, 11:12 AM

## 2015-12-16 DIAGNOSIS — L8990 Pressure ulcer of unspecified site, unspecified stage: Secondary | ICD-10-CM | POA: Insufficient documentation

## 2015-12-16 LAB — CBC
HCT: 39.3 % (ref 39.0–52.0)
Hemoglobin: 13.2 g/dL (ref 13.0–17.0)
MCH: 31.9 pg (ref 26.0–34.0)
MCHC: 33.6 g/dL (ref 30.0–36.0)
MCV: 94.9 fL (ref 78.0–100.0)
PLATELETS: 407 10*3/uL — AB (ref 150–400)
RBC: 4.14 MIL/uL — AB (ref 4.22–5.81)
RDW: 15.5 % (ref 11.5–15.5)
WBC: 13.3 10*3/uL — AB (ref 4.0–10.5)

## 2015-12-16 LAB — COMPREHENSIVE METABOLIC PANEL
ALT: 40 U/L (ref 17–63)
AST: 38 U/L (ref 15–41)
Albumin: 1.8 g/dL — ABNORMAL LOW (ref 3.5–5.0)
Alkaline Phosphatase: 105 U/L (ref 38–126)
Anion gap: 10 (ref 5–15)
BUN: 5 mg/dL — AB (ref 6–20)
CHLORIDE: 106 mmol/L (ref 101–111)
CO2: 21 mmol/L — AB (ref 22–32)
CREATININE: 0.5 mg/dL — AB (ref 0.61–1.24)
Calcium: 7.7 mg/dL — ABNORMAL LOW (ref 8.9–10.3)
GFR calc Af Amer: >60 mL/min (ref >60)
Glucose, Bld: 133 mg/dL — ABNORMAL HIGH (ref 65–99)
POTASSIUM: 3.4 mmol/L — AB (ref 3.5–5.1)
SODIUM: 137 mmol/L (ref 135–145)
Total Bilirubin: 1.5 mg/dL — ABNORMAL HIGH (ref 0.3–1.2)
Total Protein: 5.1 g/dL — ABNORMAL LOW (ref 6.5–8.1)

## 2015-12-16 MED ORDER — ENSURE ENLIVE PO LIQD
237.0000 mL | Freq: Two times a day (BID) | ORAL | Status: DC
  Administered 2015-12-17 – 2015-12-19 (×5): 237 mL via ORAL

## 2015-12-16 MED ORDER — LACTULOSE 10 GM/15ML PO SOLN
20.0000 g | Freq: Two times a day (BID) | ORAL | Status: DC
  Administered 2015-12-17 – 2015-12-19 (×4): 20 g via ORAL
  Filled 2015-12-16 (×5): qty 30

## 2015-12-16 MED ORDER — POTASSIUM CHLORIDE CRYS ER 20 MEQ PO TBCR
40.0000 meq | EXTENDED_RELEASE_TABLET | Freq: Once | ORAL | Status: AC
  Administered 2015-12-16: 40 meq via ORAL
  Filled 2015-12-16: qty 2

## 2015-12-16 MED ORDER — LACTULOSE 10 GM/15ML PO SOLN
20.0000 g | Freq: Once | ORAL | Status: AC
  Administered 2015-12-16: 20 g via ORAL
  Filled 2015-12-16: qty 30

## 2015-12-16 NOTE — Progress Notes (Signed)
PROGRESS NOTE  Howard Rhodes ZOX:[id number] DOB: Mar 16, 1931 DOA: 12/07/2015 PCP: Ailene Ravel, MD  HPI/Recap of past 24 hours: Patient is an 82 year old male with past medical history of depression, stroke which has left him paraplegic who was brought in by wife on night of 2/12 after he had been confused and disoriented times several days as well as noted to be diaphoretic. The emergency room, patient can have white count of 28, lactic acid level 6.24, febrile and hypotensive which initially required fluids and then briefly pressors. Chest x-ray noted multi lobar pneumonia.   Patient admitted to the hospital service for septic shock. Prior to leaving the emergency room, able to be weaned off pressor support and maintain stable blood pressures.  Follow-up lactic acid level down to 2.  2/2 bottles of blood cultures growing out gram-negative rods, klebsiella. Urinalysis negative. [place] and CT abdomen was performed due to elevated Bilirubin. Patient was found to have Liver abscess and HIDA scan was positive for cholecystitis. He underwent liver abscess drainage 2-18 and cholecystotomy tube placement  On 2-20 by IR.  Awaiting culture from liver abscess.   Patient alert , more verbal , able to say his name. Denies pain   Assessment/Plan:  Liver abscess, Gram-negative Septic shock (HCC), klebsiella, enteobacter Bacteremia  Patient meets criteria for septic shock on admission given hypotension persisting despite fluid support and briefly requiring pressor support, lactic acidosis, marked leukocytosis, tachypnea and tachycardia with pulmonary source noted on chest x-ray. Change Antibiotics to cipro, both organism sensitive to cipro.  Follow  Repeated Blood culture drawn 2-17. No growth to date.  RUQ [place] complex cyst in the liver, adjacent to gall bladder. Will get CT abdomen to better evaluate.  Will continue with IV ciprofloxacin. Day 8 antibiotics. He will need probably 2 weeks of IV antibiotics and  subsequently 2 or 4 weeks of oral antibiotics.  -CT consistent with 2 liver abscess. CT pelvis negative for diverticulitis.  -S/P percutaneous drainage of liver abscess 2-18.  -HIDA scan positive  for cholecystitis. Surgery consulted, recommendation for  cholecystostomy tube.  - S/P Cholecystostomy drain by Loreta Ave 12/15/2015 -culture from liver abscess growing gram negative rods, suspect klebsiella as blood culture.   Multilobar, broncho-PNA Continue with cipro.   Encephalopathy; ammonia level elevated Continue with  lactulose  Suspect related to acute illness , liver abscess.  Improving , patient more alert and verbal.   Hyperbilirubinemia: secondary to liver abscess; Elevated ammonia level. US showed complex cyst liver.  CT abdomen showed liver abscess.  Bili trending down. Ammonia trending down. Repeat in am   Dysphagia; on dysphagia 3 diet nectar thick.   Stroke (cerebrum) [facility]): At baseline  Hypokalemia:Replacing. Mg normal   Code Status: DNR  Family Communication: POA Howard Rhodes updated 2-20  Disposition Plan: Likely here for several days    Consultants:  None   Procedures:  None   Antibiotics:  IV Zosyn and vancomycin 2/13-present --- cipro    Objective: BP 104/57 mmHg  Pulse 84  Temp(Src) 99 F (37.2 C) (Oral)  Resp 16  Wt 86.637 kg (191 lb)  SpO2 97%  Intake/Output Summary (Last 24 hours) at 12/16/15 1721 Last data filed at 12/16/15 1500  Gross per 24 hour  Intake    240 ml  Output    165 ml  Net     75 ml   Filed Weights   12/12/15 1305  Weight: 86.637 kg (191 lb)    Exam:   General:   Alert, more  verbal.   Cardiovascular: Regular rate and rhythm, S1-S2   Respiratory: Few rhonchi  Abdomen: Soft, obese, nontender, hypoactive bowel sounds , 2 drain in place draining purulent, bloody material   Musculoskeletal: Trace pitting edema bilaterally    Data Reviewed: Basic Metabolic Panel:  Recent Labs Lab 12/12/15 0117 12/13/15 0230  12/14/15 0214 12/15/15 0546 12/16/15 0421  NA 141 138 137 133* 137  K 4.3 3.5 3.8 3.8 3.4*  CL 110 108 105 105 106  CO2 20* 21* 21* 21* 21*  GLUCOSE 149* 98 107* 112* 133*  BUN 5*  CREATININE 0.53* 0.51* 0.53* 0.48* 0.50*  CALCIUM 8.2* 7.9* 7.7* 7.6* 7.7*   Liver Function Tests:  Recent Labs Lab 12/10/15 0340 12/11/15 0350 12/12/15 0117 12/14/15 0214 12/16/15 0421  AST 42* 37 58* 45* 38  ALT 53 46 52 44 40  ALKPHOS 71 80 90 80 105  BILITOT 3.7* 3.1* 2.6* 2.7* 1.5*  PROT 5.0* 5.0* 5.1* 4.9* 5.1*  ALBUMIN 2.0* 1.8* 1.8* 1.6* 1.8*   No results for input(s): LIPASE, AMYLASE in the last 168 hours.  Recent Labs Lab 12/10/15 1542 12/11/15 0350 12/12/15 0121 12/14/15 0214  AMMONIA 62* 56* 65* 44*   CBC:  Recent Labs Lab 12/11/15 0350 12/13/15 0230 12/14/15 0214 12/15/15 0546 12/16/15 0421  WBC 14.8* 13.7* 13.7* 11.9* 13.3*  HGB 12.9* 12.4* 12.3* 13.2 13.2  HCT 37.8* 37.0* 36.2* 38.7* 39.3  MCV 93.3 94.1 94.0 95.3 94.9  PLT 126* 215 233 329 407*   Cardiac Enzymes:   No results for input(s): CKTOTAL, CKMB, CKMBINDEX, TROPONINI in the last 168 hours. BNP (last 3 results)  Recent Labs  04/09/15 2133  BNP 118.4*    ProBNP (last 3 results) No results for input(s): PROBNP in the last 8760 hours.  CBG:  Recent Labs Lab 12/13/15 1303  GLUCAP 88    Recent Results (from the past 240 hour(s))  Culture, blood (routine x 2)     Status: None   Collection Time: 12/07/15  8:44 PM  Result Value Ref Range Status   Specimen Description BLOOD RIGHT ANTECUBITAL  Final   Special Requests BOTTLES DRAWN AEROBIC AND ANAEROBIC 5CC   Final   Culture  Setup Time   Final    GRAM NEGATIVE RODS IN BOTH AEROBIC AND ANAEROBIC BOTTLES CRITICAL RESULT CALLED TO, READ BACK BY AND VERIFIED WITH: Lydia Guiles 12/08/15 @ 1259 M VESTAL    Culture ENTEROBACTER CLOACAE KLEBSIELLA PNEUMONIAE   Final   Report Status 12/10/2015 FINAL  Final   Organism ID, Bacteria  ENTEROBACTER CLOACAE  Final   Organism ID, Bacteria KLEBSIELLA PNEUMONIAE  Final      Susceptibility   Enterobacter cloacae - MIC*    CEFAZOLIN >=64 RESISTANT Resistant     CEFEPIME 2 SENSITIVE Sensitive     CEFTAZIDIME >=64 RESISTANT Resistant     CEFTRIAXONE >=64 RESISTANT Resistant     CIPROFLOXACIN <=0.25 SENSITIVE Sensitive     GENTAMICIN <=1 SENSITIVE Sensitive     IMIPENEM <=0.25 SENSITIVE Sensitive     TRIMETH/SULFA <=20 SENSITIVE Sensitive     PIP/TAZO >=128 RESISTANT Resistant     * ENTEROBACTER CLOACAE   Klebsiella pneumoniae - MIC*    AMPICILLIN 16 RESISTANT Resistant     CEFAZOLIN <=4 SENSITIVE Sensitive     CEFEPIME <=1 SENSITIVE Sensitive     CEFTAZIDIME <=1 SENSITIVE Sensitive     CEFTRIAXONE <=1 SENSITIVE Sensitive     CIPROFLOXACIN <=0.25 SENSITIVE Sensitive  GENTAMICIN <=1 SENSITIVE Sensitive     IMIPENEM <=0.25 SENSITIVE Sensitive     TRIMETH/SULFA <=20 SENSITIVE Sensitive     AMPICILLIN/SULBACTAM <=2 SENSITIVE Sensitive     PIP/TAZO <=4 SENSITIVE Sensitive     * KLEBSIELLA PNEUMONIAE  Culture, blood (routine x 2)     Status: None   Collection Time: 12/07/15  8:49 PM  Result Value Ref Range Status   Specimen Description BLOOD RIGHT ANTECUBITAL  Final   Special Requests BOTTLES DRAWN AEROBIC AND ANAEROBIC 5CC   Final   Culture  Setup Time   Final    GRAM NEGATIVE RODS ANAEROBIC BOTTLE ONLY CRITICAL RESULT CALLED TO, READ BACK BY AND VERIFIED WITH: B. BLAKE,RN AT 0909 ON [id number] BY Lucienne Capers    Culture   Final    KLEBSIELLA PNEUMONIAE SUSCEPTIBILITIES PERFORMED ON PREVIOUS CULTURE WITHIN THE LAST 5 DAYS.    Report Status 12/12/2015 FINAL  Final  Urine culture     Status: None   Collection Time: 12/07/15  9:18 PM  Result Value Ref Range Status   Specimen Description URINE, RANDOM  Final   Special Requests NONE  Final   Culture NO GROWTH 1 DAY  Final   Report Status 12/08/2015 FINAL  Final  MRSA PCR Screening     Status: None   Collection Time:  12/09/15 10:14 AM  Result Value Ref Range Status   MRSA by PCR NEGATIVE NEGATIVE Final    Comment:        The GeneXpert MRSA Assay (FDA approved for NASAL specimens only), is one component of a comprehensive MRSA colonization surveillance program. It is not intended to diagnose MRSA infection nor to guide or monitor treatment for MRSA infections.   Culture, blood (routine x 2)     Status: None (Preliminary result)   Collection Time: 12/12/15 12:48 AM  Result Value Ref Range Status   Specimen Description BLOOD RIGHT ARM  Final   Special Requests AEROBIC BOTTLE ONLY  Final   Culture NO GROWTH 4 DAYS  Final   Report Status PENDING  Incomplete  Culture, blood (routine x 2)     Status: None (Preliminary result)   Collection Time: 12/12/15 12:54 AM  Result Value Ref Range Status   Specimen Description BLOOD RIGHT ARM  Final   Special Requests AEROBIC BOTTLE ONLY  Final   Culture NO GROWTH 4 DAYS  Final   Report Status PENDING  Incomplete  Culture, routine-abscess     Status: None (Preliminary result)   Collection Time: 12/13/15 10:35 AM  Result Value Ref Range Status   Specimen Description ABSCESS  Final   Special Requests LIVER  Final   Gram Stain   Final    ABUNDANT WBC PRESENT,BOTH PMN AND MONONUCLEAR FEW SQUAMOUS EPITHELIAL CELLS PRESENT NO ORGANISMS SEEN Performed at [facility]    Culture   Final    MODERATE GRAM NEGATIVE RODS Performed at [facility]    Report Status PENDING  Incomplete  Culture, routine-abscess     Status: None (Preliminary result)   Collection Time: 12/15/15  4:08 PM  Result Value Ref Range Status   Specimen Description GALL BLADDER  Final   Special Requests NONE  Final   Gram Stain   Final    MODERATE WBC PRESENT,BOTH PMN AND MONONUCLEAR NO SQUAMOUS EPITHELIAL CELLS SEEN NO ORGANISMS SEEN Performed at [facility]    Culture PENDING  Incomplete   Report Status PENDING  Incomplete  Studies: No results  found.  Scheduled Meds: . antiseptic oral rinse  7 mL Mouth Rinse q12n4p  . chlorhexidine  15 mL Mouth Rinse BID  . ciprofloxacin  400 mg Intravenous Q12H  . enoxaparin (LOVENOX) injection  40 mg Subcutaneous Q24H  . feeding supplement (ENSURE ENLIVE)  237 mL Oral BID BM  . lactulose  20 g Oral BID    Continuous Infusions: . sodium chloride 50 mL/hr at 12/16/15 0755     Time spent: 25 minutes  Prentiss Polio A  [facility] Pager [phone] . If 7PM-7AM, please contact night-coverage at [email or web address], password [facility] 12/16/2015, 5:21 PM  LOS: 8 days

## 2015-12-16 NOTE — Progress Notes (Signed)
OT Cancellation Note  Patient Details Name: Howard Rhodes MRN: [id number] DOB: 20-Jul-1931   Cancelled Treatment:    Reason Eval/Treat Not Completed: OT screened, no needs identified, will sign off. Pt with baseline dependence in ADL. No acute OT needs. Will need placement if family is not able to care for pt at home.  Evern Bio 12/16/2015, 1:10 PM

## 2015-12-16 NOTE — Progress Notes (Signed)
PT Cancellation Note  Patient Details Name: Howard Rhodes MRN: [id number] DOB: [birth year]   Cancelled Treatment:    Reason Eval/Treat Not Completed: PT screened, no needs identified, will sign off  Per chart review pt dependent at baseline. No PT needs identified. If family cannot perform care pt will require SNF.   [name] 12/16/2015, 8:44 AM

## 2015-12-16 NOTE — Progress Notes (Signed)
Subjective: Non-verbal  Objective: Vital signs in last 24 hours: Temp:  [98 F (36.7 C)-99 F (37.2 C)] 98 F (36.7 C) (02/21 0509) Pulse Rate:  [82-95] 84 (02/21 0509) Resp:  [15-26] 18 (02/21 0509) BP: (101-129)/(61-84) 103/61 mmHg (02/21 0509) SpO2:  [96 %-99 %] 98 % (02/21 0509) Last BM Date: 12/15/15  Intake/Output from previous day: 02/20 0701 - 02/21 0700 In: -  Out: 75 [Drains:75] Intake/Output this shift: Total I/O In: -  Out: 90 [Drains:90]  General appearance: no distress GI: Perc drain X 2 both draining bilious fluid, soft  Lab Results:   Recent Labs  12/15/15 0546 12/16/15 0421  WBC 11.9* 13.3*  HGB 13.2 13.2  HCT 38.7* 39.3  PLT 329 407*   BMET  Recent Labs  12/15/15 0546 12/16/15 0421  NA 133* 137  K 3.8 3.4*  CL 105 106  CO2 21* 21*  GLUCOSE 112* 133*  BUN 6 5*  CREATININE 0.48* 0.50*  CALCIUM 7.6* 7.7*   PT/INR  Recent Labs  12/15/15 0849  LABPROT 15.8*  INR 1.25   ABG No results for input(s): PHART, HCO3 in the last 72 hours.  Invalid input(s): PCO2, PO2  Studies/Results: Nm Hepatobiliary Liver Func  12/14/2015  CLINICAL DATA:  History of hepatic abscess. Question of subacute cholecystitis associated with abscess. EXAM: NUCLEAR MEDICINE HEPATOBILIARY IMAGING TECHNIQUE: Sequential images of the abdomen were obtained out to 60 minutes following intravenous administration of radiopharmaceutical. RADIOPHARMACEUTICALS:  5.4 mCi Tc-27m  Choletec IV COMPARISON:  Ultrasound 12/10/2015 FINDINGS: Initial imaging is performed following administration Choletec. After 45 minutes, of gallbladder is not visualized. Morphine was administered. Additional imaging is performed, failing to reveal gallbladder activity. IMPRESSION: Findings are consistent with acute cholecystitis. Electronically Signed   By: Norva Pavlov M.D.   On: 12/14/2015 12:41   Ir Perc Cholecystostomy  12/15/2015  INDICATION: 82 year old male with a history of acute  cholecystitis and liver abscess. He has had liver abscess drained with percutaneous 10 French drain 12/13/2015. Nuclear medicine HIDA study confirms acute calculus cholecystitis. He has been referred for percutaneous cholecystostomy tube EXAM: CHOLECYSTOSTOMY MEDICATIONS: The patient is currently admitted to the hospital and receiving intravenous antibiotics. The antibiotics were administered within an appropriate time frame prior to the initiation of the procedure. ANESTHESIA/SEDATION: Fentanyl 25 mcg IV; Versed 0.5 mg IV Moderate Sedation Time:  13 The patient was continuously monitored during the procedure by the interventional radiology nurse under my direct supervision. COMPLICATIONS: None PROCEDURE: Informed written consent was obtained from the patient and the patient's family after a thorough discussion of the procedural risks, benefits and alternatives. All questions were addressed. Maximal Sterile Barrier Technique was utilized including caps, mask, sterile gowns, sterile gloves, sterile drape, hand hygiene and skin antiseptic. A timeout was performed prior to the initiation of the procedure. Ultrasound survey of the right upper quadrant was performed for planning purposes. Once the patient is prepped and draped in the usual sterile fashion, the skin and subcutaneous tissues overlying the gallbladder were generously infiltrated 1% lidocaine for local anesthesia. A coaxial needle was advanced under ultrasound guidance through the skin subcutaneous tissues and a small segment of liver into the gallbladder lumen. With removal of the stylet, spontaneous dark bile drainage occurred. Using modified Seldinger technique, a 10 French drain was placed into the gallbladder fossa, with aspiration of the sample for the lab. Contrast injection confirmed position of the tube within the gallbladder lumen. Drainage catheter was attached to gravity drain with a suture retention placed.  Patient tolerated the procedure well  and remained hemodynamically stable throughout. No complications were encountered and no significant blood loss encountered. IMPRESSION: Status post image guided percutaneous cholecystostomy tube. Sample was sent to the lab for analysis. Signed, Yvone Neu. Loreta Ave, DO [facility] Electronically Signed   By: Gilmer Mor D.O.   On: 12/15/2015 16:55    Anti-infectives: Anti-infectives    Start     Dose/Rate Route Frequency Ordered Stop   12/15/15 0945  ceFAZolin (ANCEF) IVPB 2 g/50 mL premix     2 g 100 mL/hr over 30 Minutes Intravenous To Radiology 12/15/15 0931 12/15/15 1015   12/11/15 2100  ciprofloxacin (CIPRO) IVPB 400 mg     400 mg 200 mL/hr over 60 Minutes Intravenous Every 12 hours 12/11/15 1649     12/11/15 0800  ciprofloxacin (CIPRO) tablet 500 mg  Status:  Discontinued     500 mg Oral 2 times daily 12/10/15 1014 12/11/15 1648   12/10/15 1100  ciprofloxacin (CIPRO) IVPB 400 mg     400 mg 200 mL/hr over 60 Minutes Intravenous Every 12 hours 12/10/15 1014 12/11/15 0030   12/08/15 0930  vancomycin (VANCOCIN) IVPB 1000 mg/200 mL premix  Status:  Discontinued     1,000 mg 200 mL/hr over 60 Minutes Intravenous Every 12 hours 12/07/15 2207 12/09/15 1219   12/08/15 0400  piperacillin-tazobactam (ZOSYN) IVPB 3.375 g  Status:  Discontinued     3.375 g 12.5 mL/hr over 240 Minutes Intravenous 3 times per day 12/08/15 0259 12/10/15 1050   12/07/15 2045  piperacillin-tazobactam (ZOSYN) IVPB 3.375 g     3.375 g 100 mL/hr over 30 Minutes Intravenous  Once 12/07/15 2035 12/07/15 2131   12/07/15 2045  vancomycin (VANCOCIN) 1,250 mg in sodium chloride 0.9 % 250 mL IVPB     1,250 mg 166.7 mL/hr over 90 Minutes Intravenous STAT 12/07/15 2040 12/07/15 2349     Results for orders placed or performed during the hospital encounter of 12/07/15  Culture, blood (routine x 2)     Status: None   Collection Time: 12/07/15  8:44 PM  Result Value Ref  Range Status   Specimen Description BLOOD RIGHT ANTECUBITAL  Final   Special Requests BOTTLES DRAWN AEROBIC AND ANAEROBIC 5CC   Final   Culture  Setup Time   Final    GRAM NEGATIVE RODS IN BOTH AEROBIC AND ANAEROBIC BOTTLES CRITICAL RESULT CALLED TO, READ BACK BY AND VERIFIED WITH: Lydia Guiles 12/08/15 @ 1259 M VESTAL    Culture ENTEROBACTER CLOACAE KLEBSIELLA PNEUMONIAE   Final   Report Status 12/10/2015 FINAL  Final   Organism ID, Bacteria ENTEROBACTER CLOACAE  Final   Organism ID, Bacteria KLEBSIELLA PNEUMONIAE  Final      Susceptibility   Enterobacter cloacae - MIC*    CEFAZOLIN >=64 RESISTANT Resistant     CEFEPIME 2 SENSITIVE Sensitive     CEFTAZIDIME >=64 RESISTANT Resistant     CEFTRIAXONE >=64 RESISTANT Resistant     CIPROFLOXACIN <=0.25 SENSITIVE Sensitive     GENTAMICIN <=1 SENSITIVE Sensitive     IMIPENEM <=0.25 SENSITIVE Sensitive     TRIMETH/SULFA <=20 SENSITIVE Sensitive     PIP/TAZO >=128 RESISTANT Resistant     * ENTEROBACTER CLOACAE   Klebsiella pneumoniae - MIC*    AMPICILLIN 16 RESISTANT Resistant     CEFAZOLIN <=4 SENSITIVE Sensitive     CEFEPIME <=1 SENSITIVE Sensitive     CEFTAZIDIME <=1 SENSITIVE Sensitive     CEFTRIAXONE <=  1 SENSITIVE Sensitive     CIPROFLOXACIN <=0.25 SENSITIVE Sensitive     GENTAMICIN <=1 SENSITIVE Sensitive     IMIPENEM <=0.25 SENSITIVE Sensitive     TRIMETH/SULFA <=20 SENSITIVE Sensitive     AMPICILLIN/SULBACTAM <=2 SENSITIVE Sensitive     PIP/TAZO <=4 SENSITIVE Sensitive     * KLEBSIELLA PNEUMONIAE  Culture, blood (routine x 2)     Status: None   Collection Time: 12/07/15  8:49 PM  Result Value Ref Range Status   Specimen Description BLOOD RIGHT ANTECUBITAL  Final   Special Requests BOTTLES DRAWN AEROBIC AND ANAEROBIC 5CC   Final   Culture  Setup Time   Final    GRAM NEGATIVE RODS ANAEROBIC BOTTLE ONLY CRITICAL RESULT CALLED TO, READ BACK BY AND VERIFIED WITH: B. BLAKE,RN AT 0909 ON [id number] BY Lucienne Capers    Culture    Final    KLEBSIELLA PNEUMONIAE SUSCEPTIBILITIES PERFORMED ON PREVIOUS CULTURE WITHIN THE LAST 5 DAYS.    Report Status 12/12/2015 FINAL  Final  Urine culture     Status: None   Collection Time: 12/07/15  9:18 PM  Result Value Ref Range Status   Specimen Description URINE, RANDOM  Final   Special Requests NONE  Final   Culture NO GROWTH 1 DAY  Final   Report Status 12/08/2015 FINAL  Final  MRSA PCR Screening     Status: None   Collection Time: 12/09/15 10:14 AM  Result Value Ref Range Status   MRSA by PCR NEGATIVE NEGATIVE Final    Comment:        The GeneXpert MRSA Assay (FDA approved for NASAL specimens only), is one component of a comprehensive MRSA colonization surveillance program. It is not intended to diagnose MRSA infection nor to guide or monitor treatment for MRSA infections.   Culture, blood (routine x 2)     Status: None (Preliminary result)   Collection Time: 12/12/15 12:48 AM  Result Value Ref Range Status   Specimen Description BLOOD RIGHT ARM  Final   Special Requests AEROBIC BOTTLE ONLY  Final   Culture NO GROWTH 3 DAYS  Final   Report Status PENDING  Incomplete  Culture, blood (routine x 2)     Status: None (Preliminary result)   Collection Time: 12/12/15 12:54 AM  Result Value Ref Range Status   Specimen Description BLOOD RIGHT ARM  Final   Special Requests AEROBIC BOTTLE ONLY  Final   Culture NO GROWTH 3 DAYS  Final   Report Status PENDING  Incomplete  Culture, routine-abscess     Status: None (Preliminary result)   Collection Time: 12/13/15 10:35 AM  Result Value Ref Range Status   Specimen Description ABSCESS  Final   Special Requests LIVER  Final   Gram Stain   Final    ABUNDANT WBC PRESENT,BOTH PMN AND MONONUCLEAR FEW SQUAMOUS EPITHELIAL CELLS PRESENT NO ORGANISMS SEEN Performed at [facility]    Culture   Final    MODERATE GRAM NEGATIVE RODS Performed at [facility]    Report Status PENDING  Incomplete      Assessment/Plan: Cholecystitis with liver abscess - S/P perc drain of abscess and GB.  ID - on Vanc/Zosyn, enterobacter ans klebsiella bacteremia (may not need Vanc)  LOS: 8 days    Zahraa Bhargava E 12/16/2015

## 2015-12-16 NOTE — Progress Notes (Signed)
Speech Language Pathology Dysphagia Treatment Patient Details Name: Howard Rhodes MRN: [id number] DOB: 1931-06-02 Today's Date: 12/16/2015 Time: 4098-1191 SLP Time Calculation (min) (ACUTE ONLY): 13 min  Assessment / Plan / Recommendation Clinical Impression    Pt verbal during today's session as opposed to previously (2/17) and oriented to place and situation. SLP provided mod verbal cues to attend and take small bites/sips. No evidence of dysphagia and no s/s of penetration/aspiration observed. Pt educated re: need for thickened liquids, aspiration precautions and diet recommendation. Recommend Dysphagia 3 (mechanical soft) diet, nectar thick liquids, meds whole in puree and full assist/supervision. SLP will f/u to determine diet toleration and advise diet advancement.    Diet Recommendation    Dysphagia 3 diet, nectar thick liquids, meds whole in puree   SLP Plan Continue with current plan of care      Swallowing Goals     General Behavior/Cognition: Alert;Cooperative;Pleasant mood Patient Positioning: Upright in bed Oral care provided: N/A HPI: 82 year old male with past medical history of depression, stroke which has left him paraplegic who was brought in by wife on night of 2/12 after he had been confused and disoriented for several days as well as noted to be diaphoretic.  Dx sepsis secondary to CAP, acute encephalopathy. Followed by SLP services June 2016 admission - concerns for choking incidents with dry solids; he was D/Cd on a dysphagia 3 diet with thin liquids.   Oral Cavity - Oral Hygiene     Dysphagia Treatment Treatment Methods: Skilled observation;Compensation strategy training;Patient/caregiver education;Differential diagnosis Patient observed directly with PO's: Yes Type of PO's observed: Dysphagia 3 (soft);Dysphagia 1 (puree);Nectar-thick liquids Feeding: Total assist Liquids provided via: Cup;No straw Oral Phase Signs & Symptoms:  (none) Pharyngeal Phase Signs &  Symptoms:  (none) Type of cueing: Verbal Amount of cueing: Moderate   GO     Howard Rhodes 12/16/2015, 2:34 PM  Lynita Lombard, Student-SLP

## 2015-12-16 NOTE — Progress Notes (Signed)
Referring Physician(s): Belkys Regaldado MD B. Janee Morn  Chief ComplaintBuel Rhodes for [facility] S/P Cholecystostomy drain by Loreta Ave 12/15/2015  Subjective:  Howard Rhodes is resting comfortably.    His wife and another friend/family member is at the bedside. The wife reports that she thinks he seems to be feeling better.  He is non-verbal  Allergies: Review of patient's allergies indicates no known allergies.  Medications: Prior to Admission medications   Medication Sig Start Date End Date Taking? Authorizing Provider  atorvastatin (LIPITOR) 40 MG tablet Take 40 mg by mouth daily at 6 PM.   Yes Historical Provider, MD  furosemide (LASIX) 20 MG tablet Take 1 tablet (20 mg total) by mouth daily. 04/18/15  Yes Dorothea Ogle, MD  cefUROXime (CEFTIN) 500 MG tablet Take 1 tablet (500 mg total) by mouth 2 (two) times daily with a meal. Patient not taking: Reported on 12/07/2015 04/18/15   Dorothea Ogle, MD  HYDROcodone-acetaminophen (NORCO/VICODIN) 5-325 MG per tablet Take 1 tablet by mouth every 6 (six) hours as needed for pain. Patient not taking: Reported on 12/07/2015 04/16/13   Vassie Loll, MD  levalbuterol [facility]) 0.63 MG/3ML nebulizer solution Take 3 mLs (0.63 mg total) by nebulization every 6 (six) hours as needed for wheezing or shortness of breath. Patient not taking: Reported on 12/07/2015 04/18/15   Dorothea Ogle, MD     Vital Signs: BP 103/56 mmHg  Pulse 84  Temp(Src) 98.7 F (37.1 C) (Oral)  Resp 16  Wt 191 lb (86.637 kg)  SpO2 98%  Physical Exam Awake and Alert NAD Heart RRR Abdomen soft, NTND Hepatic drain in place, intact. Chole drain in place, intact. Thin bilious drainage in bag. Drains flush easily. Total output recorded 75 mls.  Imaging: Ct Pelvis Wo Contrast  12/12/2015  CLINICAL DATA:  82 year old male with hepatic abscess seen on earlier study. Delayed images through the pelvis performed to evaluate for possible  diverticulitis. EXAM: CT PELVIS WITHOUT CONTRAST TECHNIQUE: Multidetector CT imaging of the pelvis was performed following the standard protocol without intravenous contrast. COMPARISON:  Earlier CT dated 12/12/2015 FINDINGS: No free air or free fluid identified within the pelvis. There is mild thickening and trabecular appearance of the bladder the, likely related to a degree of chronic bladder outlet obstruction. The prostate gland is grossly unremarkable. There are scattered sigmoid diverticula without active inflammatory changes. Normal appendix. No dilated bowel loops identified within the pelvis. There is aortoiliac atherosclerotic disease. There is a 3 cm aneurysmal dilatation of the distal aorta. No adenopathy identified within the pelvis. Small fat containing umbilical hernia. There is osteopenia with extensive degenerative changes of the spine. Left hip intra medullary orthopedic hardware and ectopic ossification. No acute fracture. IMPRESSION: Sigmoid diverticulosis. A 3 cm distal abdominal aortic aneurysm. Recommend followup by ultrasound in 3 years. This recommendation follows ACR consensus guidelines: White Paper of the ACR Incidental Findings Committee II on Vascular Findings. J Am Coll Radiol 2013; 96:045-409 Electronically Signed   By: Elgie Collard M.D.   On: 12/12/2015 21:05   Ct Angio Abdomen W/cm &/or Wo Contrast  12/12/2015  CLINICAL DATA:  Evaluate liver cyst on ultrasound EXAM: CT ANGIOGRAPHY ABDOMEN TECHNIQUE: Multidetector CT imaging of the abdomen was performed using the standard protocol during bolus administration of intravenous contrast. Multiplanar reconstructed images including MIPs were obtained and reviewed to evaluate the vascular anatomy. CONTRAST:  OMNIPAQUE IOHEXOL 300 MG/ML  SOLN COMPARISON:  None. FINDINGS: Fusiform ectasia  of the infrarenal abdominal aorta, measuring 2.9 x 2.5 cm (series 3/image 89). No evidence of abdominal aortic aneurysm. Atherosclerotic  calcifications of the abdominal aorta. Lower chest: Trace bilateral pleural effusions with associated mild dependent atelectasis in the bilateral lower lobes. Hepatobiliary: 5.2 x 6.1 x 4.7 cm complex cystic lesion in the anterior right hepatic dome (series 7/ image 36). Additional 4.7 x 6.2 x 6.2 cm complex cystic lesion inferiorly in the right hepatic lobe (series 7/image 50). This appearance is worrisome for multifocal hepatic abscesses. Additional scattered smaller cystic lesions measuring 11 mm in the medial segment left hepatic lobe (series 7/ image 33) and 13 mm posteriorly in the medial segment left hepatic lobe (series 7/ image 29) are indeterminate. Gallbladder is mildly thick-walled. Subtle discontinuity along the posterior gallbladder wall is not excluded (series 3/ image 45), possibly reflecting the etiology of the hepatic abscesses, although the relative lack gallbladder inflammation is unusual given the size of the liver involvement. No intrahepatic or extrahepatic ductal dilatation. Pancreas: Within normal limits. Spleen: Within normal limits. Adrenals/Urinary Tract: Adrenal glands are within normal limits. Kidneys are within normal limits.  No hydronephrosis. Stomach/Bowel: Stomach is within normal limits. Visualized bowel is unremarkable, noting colonic diverticulosis. Vascular/Lymphatic: Vascular findings as above. No suspicious abdominal lymphadenopathy. 14 mm short axis portacaval node (series 3/image 56), within the upper limits of normal. Other: No abdominal ascites. Musculoskeletal: Degenerative changes of the visualized thoracolumbar spine. Mild anterior wedging at T8. Review of the MIP images confirms the above findings. IMPRESSION: Two suspected hepatic abscesses measuring up to 6.2 cm in the right hepatic lobe, as above. Possible discontinuity along the posterior gallbladder wall, equivocal. While this may be the source of the hepatic abscesses, the relative lack of gallbladder  inflammation is unusual. Consider CT pelvis to evaluate for additional etiologies including diverticulitis. These results were called by telephone at the time of interpretation on 12/12/2015 at 5:20 pm to Dr. Hartley Barefoot , who verbally acknowledged these results. Electronically Signed   By: Charline Bills M.D.   On: 12/12/2015 17:23   Nm Hepatobiliary Liver Func  12/14/2015  CLINICAL DATA:  History of hepatic abscess. Question of subacute cholecystitis associated with abscess. EXAM: NUCLEAR MEDICINE HEPATOBILIARY IMAGING TECHNIQUE: Sequential images of the abdomen were obtained out to 60 minutes following intravenous administration of radiopharmaceutical. RADIOPHARMACEUTICALS:  5.4 mCi Tc-66m  Choletec IV COMPARISON:  Ultrasound 12/10/2015 FINDINGS: Initial imaging is performed following administration Choletec. After 45 minutes, of gallbladder is not visualized. Morphine was administered. Additional imaging is performed, failing to reveal gallbladder activity. IMPRESSION: Findings are consistent with acute cholecystitis. Electronically Signed   By: Norva Pavlov M.D.   On: 12/14/2015 12:41   Ir Guided Horace Porteous W Catheter Placement  12/13/2015  INDICATION: Liver abscess EXAM: ABSCESS DRAINAGE; IR ULTRASOUND GUIDANCE MEDICATIONS: The patient is currently admitted to the hospital and receiving intravenous antibiotics. The antibiotics were administered within an appropriate time frame prior to the initiation of the procedure. ANESTHESIA/SEDATION: Fentanyl 1 mcg IV; Versed 50 mg IV Moderate Sedation Time:  13 The patient was continuously monitored during the procedure by the interventional radiology nurse under my direct supervision. COMPLICATIONS: None immediate. PROCEDURE: Informed written consent was obtained from the patient after a thorough discussion of the procedural risks, benefits and alternatives. All questions were addressed. Maximal Sterile Barrier Technique was utilized including caps, mask,  sterile gowns, sterile gloves, sterile drape, hand hygiene and skin antiseptic. A timeout was performed prior to the initiation of the procedure. Under sonographic  guidance, a 21 gauge needle was inserted into the liver abscess via inter costal approach. It was removed over a 018 wire which was up sized to a 3 J. a 10 [place] dilator followed by a 10 [place] drain were inserted into the abscess. It was looped and string fixed. It was sewn to the skin. Contrast was injected. 20 cc of pus was aspirated. FINDINGS: Imaging documents placement of a 10 French drain in a liver abscess. IMPRESSION: Successful 10 French hepatic abscess strain yielding pus. Electronically Signed   By: Jolaine Click M.D.   On: 12/13/2015 13:17   Ir Perc Cholecystostomy  12/15/2015  INDICATION: 82 year old male with a history of acute cholecystitis and liver abscess. He has had liver abscess drained with percutaneous 10 French drain 12/13/2015. Nuclear medicine HIDA study confirms acute calculus cholecystitis. He has been referred for percutaneous cholecystostomy tube EXAM: CHOLECYSTOSTOMY MEDICATIONS: The patient is currently admitted to the hospital and receiving intravenous antibiotics. The antibiotics were administered within an appropriate time frame prior to the initiation of the procedure. ANESTHESIA/SEDATION: Fentanyl 25 mcg IV; Versed 0.5 mg IV Moderate Sedation Time:  13 The patient was continuously monitored during the procedure by the interventional radiology nurse under my direct supervision. COMPLICATIONS: None PROCEDURE: Informed written consent was obtained from the patient and the patient's family after a thorough discussion of the procedural risks, benefits and alternatives. All questions were addressed. Maximal Sterile Barrier Technique was utilized including caps, mask, sterile gowns, sterile gloves, sterile drape, hand hygiene and skin antiseptic. A timeout was performed prior to the initiation of the procedure. Ultrasound  survey of the right upper quadrant was performed for planning purposes. Once the patient is prepped and draped in the usual sterile fashion, the skin and subcutaneous tissues overlying the gallbladder were generously infiltrated 1% lidocaine for local anesthesia. A coaxial needle was advanced under ultrasound guidance through the skin subcutaneous tissues and a small segment of liver into the gallbladder lumen. With removal of the stylet, spontaneous dark bile drainage occurred. Using modified Seldinger technique, a 10 French drain was placed into the gallbladder fossa, with aspiration of the sample for the lab. Contrast injection confirmed position of the tube within the gallbladder lumen. Drainage catheter was attached to gravity drain with a suture retention placed. Patient tolerated the procedure well and remained hemodynamically stable throughout. No complications were encountered and no significant blood loss encountered. IMPRESSION: Status post image guided percutaneous cholecystostomy tube. Sample was sent to the lab for analysis. Signed, Yvone Neu. Loreta Ave, DO [facility] Electronically Signed   By: Gilmer Mor D.O.   On: 12/15/2015 16:55   Ir US Guide Bx Asp/drain  12/13/2015  INDICATION: Liver abscess EXAM: ABSCESS DRAINAGE; IR ULTRASOUND GUIDANCE MEDICATIONS: The patient is currently admitted to the hospital and receiving intravenous antibiotics. The antibiotics were administered within an appropriate time frame prior to the initiation of the procedure. ANESTHESIA/SEDATION: Fentanyl 1 mcg IV; Versed 50 mg IV Moderate Sedation Time:  13 The patient was continuously monitored during the procedure by the interventional radiology nurse under my direct supervision. COMPLICATIONS: None immediate. PROCEDURE: Informed written consent was obtained from the patient after a thorough discussion of the procedural risks, benefits and alternatives. All  questions were addressed. Maximal Sterile Barrier Technique was utilized including caps, mask, sterile gowns, sterile gloves, sterile drape, hand hygiene and skin antiseptic. A timeout was performed prior to the initiation of the procedure. Under sonographic guidance, a 21 gauge needle was  inserted into the liver abscess via inter costal approach. It was removed over a 018 wire which was up sized to a 3 J. a 10 [place] dilator followed by a 10 [place] drain were inserted into the abscess. It was looped and string fixed. It was sewn to the skin. Contrast was injected. 20 cc of pus was aspirated. FINDINGS: Imaging documents placement of a 10 French drain in a liver abscess. IMPRESSION: Successful 10 French hepatic abscess strain yielding pus. Electronically Signed   By: Jolaine Click M.D.   On: 12/13/2015 13:17    Labs:  CBC:  Recent Labs  12/13/15 0230 12/14/15 0214 12/15/15 0546 12/16/15 0421  WBC 13.7* 13.7* 11.9* 13.3*  HGB 12.4* 12.3* 13.2 13.2  HCT 37.0* 36.2* 38.7* 39.3  PLT 215 233 329 407*    COAGS:  Recent Labs  04/09/15 2133 12/08/15 0408 12/15/15 0849  INR 1.35 1.34 1.25  APTT 29 37 37    BMP:  Recent Labs  12/13/15 0230 12/14/15 0214 12/15/15 0546 12/16/15 0421  NA 138 137 133* 137  K 3.5 3.8 3.8 3.4*  CL 108 105 105 106  CO2 21* 21* 21* 21*  GLUCOSE 98 107* 112* 133*  BUN 5*  CALCIUM 7.9* 7.7* 7.6* 7.7*  CREATININE 0.51* 0.53* 0.48* 0.50*  GFRNONAA >60 >60 >60 >60  GFRAA >60 >60 >60 >60    LIVER FUNCTION TESTS:  Recent Labs  12/11/15 0350 12/12/15 0117 12/14/15 0214 12/16/15 0421  BILITOT 3.1* 2.6* 2.7* 1.5*  AST 37 58* 45* 38  ALT 46 52 44 40  ALKPHOS 80 90 80 105  PROT 5.0* 5.1* 4.9* 5.1*  ALBUMIN 1.8* 1.8* 1.6* 1.8*    Assessment and Plan:  S/P perc drain for liver abscess by [facility] 12/13/2015 S/P Cholecystostomy drain by Loreta Ave 12/15/2015  Continue current routine drain care and antibiotics.  Electronically Signed: Gwynneth Macleod  PA-C 12/16/2015, 1:11 PM   I spent a total of 15 Minutes at the the patient's bedside AND on the patient's hospital floor or unit, greater than 50% of which was counseling/coordinating care for f/u of perc drain and chole drain.

## 2015-12-17 DIAGNOSIS — R6521 Severe sepsis with septic shock: Secondary | ICD-10-CM

## 2015-12-17 DIAGNOSIS — G9340 Encephalopathy, unspecified: Secondary | ICD-10-CM

## 2015-12-17 DIAGNOSIS — J189 Pneumonia, unspecified organism: Secondary | ICD-10-CM

## 2015-12-17 DIAGNOSIS — A419 Sepsis, unspecified organism: Secondary | ICD-10-CM

## 2015-12-17 DIAGNOSIS — K819 Cholecystitis, unspecified: Secondary | ICD-10-CM

## 2015-12-17 LAB — CULTURE, BLOOD (ROUTINE X 2): CULTURE: NO GROWTH

## 2015-12-17 LAB — CULTURE, ROUTINE-ABSCESS

## 2015-12-17 LAB — AMMONIA: AMMONIA: 46 umol/L — AB (ref 9–35)

## 2015-12-17 MED ORDER — VANCOMYCIN HCL IN DEXTROSE 1-5 GM/200ML-% IV SOLN
1000.0000 mg | Freq: Two times a day (BID) | INTRAVENOUS | Status: DC
  Administered 2015-12-17 – 2015-12-18 (×3): 1000 mg via INTRAVENOUS
  Filled 2015-12-17 (×4): qty 200

## 2015-12-17 NOTE — Progress Notes (Signed)
  Subjective: Says good morning  Objective: Vital signs in last 24 hours: Temp:  [98.1 F (36.7 C)-99 F (37.2 C)] 98.1 F (36.7 C) (02/22 0419) Pulse Rate:  [79-84] 82 (02/22 0419) Resp:  [16-18] 16 (02/22 0419) BP: (101-108)/(54-58) 101/54 mmHg (02/22 0419) SpO2:  [97 %-98 %] 97 % (02/22 0419) Last BM Date: 12/16/15  Intake/Output from previous day: 02/21 0701 - 02/22 0700 In: 450 [P.O.:240; I.V.:200] Out: 215 [Drains:215] Intake/Output this shift: Total I/O In: 385 [I.V.:385] Out: -   GI: perc chole and liver abscess drains working, abdomen soft  Lab Results:   Recent Labs  12/15/15 0546 12/16/15 0421  WBC 11.9* 13.3*  HGB 13.2 13.2  HCT 38.7* 39.3  PLT 329 407*   BMET  Recent Labs  12/15/15 0546 12/16/15 0421  NA 133* 137  K 3.8 3.4*  CL 105 106  CO2 21* 21*  GLUCOSE 112* 133*  BUN 6 5*  CREATININE 0.48* 0.50*  CALCIUM 7.6* 7.7*   PT/INR  Recent Labs  12/15/15 0849  LABPROT 15.8*  INR 1.25   Anti-infectives: Anti-infectives    Start     Dose/Rate Route Frequency Ordered Stop   12/15/15 0945  ceFAZolin (ANCEF) IVPB 2 g/50 mL premix     2 g 100 mL/hr over 30 Minutes Intravenous To Radiology 12/15/15 0931 12/15/15 1015   12/11/15 2100  ciprofloxacin (CIPRO) IVPB 400 mg     400 mg 200 mL/hr over 60 Minutes Intravenous Every 12 hours 12/11/15 1649     12/11/15 0800  ciprofloxacin (CIPRO) tablet 500 mg  Status:  Discontinued     500 mg Oral 2 times daily 12/10/15 1014 12/11/15 1648   12/10/15 1100  ciprofloxacin (CIPRO) IVPB 400 mg     400 mg 200 mL/hr over 60 Minutes Intravenous Every 12 hours 12/10/15 1014 12/11/15 0030   12/08/15 0930  vancomycin (VANCOCIN) IVPB 1000 mg/200 mL premix  Status:  Discontinued     1,000 mg 200 mL/hr over 60 Minutes Intravenous Every 12 hours 12/07/15 2207 12/09/15 1219   12/08/15 0400  piperacillin-tazobactam (ZOSYN) IVPB 3.375 g  Status:  Discontinued     3.375 g 12.5 mL/hr over 240 Minutes Intravenous 3  times per day 12/08/15 0259 12/10/15 1050   12/07/15 2045  piperacillin-tazobactam (ZOSYN) IVPB 3.375 g     3.375 g 100 mL/hr over 30 Minutes Intravenous  Once 12/07/15 2035 12/07/15 2131   12/07/15 2045  vancomycin (VANCOCIN) 1,250 mg in sodium chloride 0.9 % 250 mL IVPB     1,250 mg 166.7 mL/hr over 90 Minutes Intravenous STAT 12/07/15 2040 12/07/15 2349      Assessment/Plan: Cholecystitis with liver abscess - S/P perc drain of abscess and GB.  ID - on Cipro, enterobacter and klebsiella bacteremia  LOS: 9 days    Howard Rhodes 12/17/2015

## 2015-12-17 NOTE — Progress Notes (Signed)
PROGRESS NOTE  Howard Rhodes ZOX:[id number] DOB: May 07, 1931 DOA: 12/07/2015 PCP: Ailene Ravel, MD  HPI/Recap of past 24 hours: Patient is an 82 year old male with past medical history of depression, stroke which has left him paraplegic who was brought in by wife on night of 2/12 after he had been confused and disoriented times several days as well as noted to be diaphoretic. The emergency room, patient can have white count of 28, lactic acid level 6.24, febrile and hypotensive which initially required fluids and then briefly pressors. Chest x-ray noted multi lobar pneumonia.   Patient admitted to the hospital service for septic shock. Prior to leaving the emergency room, able to be weaned off pressor support and maintain stable blood pressures.  Follow-up lactic acid level down to 2.  2/2 bottles of blood cultures growing out gram-negative rods, klebsiella. Urinalysis negative. [place] and CT abdomen was performed due to elevated Bilirubin. Patient was found to have Liver abscess and HIDA scan was positive for cholecystitis. He underwent liver abscess drainage 2-18 and cholecystotomy tube placement  On 2-20 by IR.  Awaiting culture from liver abscess.   Patient alert , more verbal , able to say his name. Denies pain   Assessment/Plan:  Liver abscess, Gram-negative Septic shock (HCC), klebsiella, enteobacter Bacteremia (+blood culture on 2/12) Patient meets criteria for septic shock on admission given hypotension persisting despite fluid support and briefly requiring pressor support, lactic acidosis, marked leukocytosis, tachypnea and tachycardia with pulmonary source noted on chest x-ray. Change Antibiotics to cipro, both organism sensitive to cipro.  Blood culture drawn 2-17. No growth to date.  RUQ [place] complex cyst in the liver, adjacent to gall bladder.   Will continue with IV ciprofloxacin. Day 8 antibiotics. He will need probably 2 weeks of IV antibiotics and subsequently 2 or 4 weeks of oral  antibiotics.  -CT consistent with 2 liver abscess. CT pelvis negative for diverticulitis.  -S/P percutaneous drainage of liver abscess 2-18.  -HIDA scan positive  for cholecystitis. Surgery consulted, recommendation for  cholecystostomy tube.  - S/P Cholecystostomy drain by Loreta Ave 12/15/2015 -culture from liver abscess growing gram negative rods, suspect klebsiella as blood culture.   Multilobar, broncho-PNA Continue with cipro. Weaned off oxygen, currently on room air  Encephalopathy; ammonia level elevated Continue with  lactulose  Suspect related to acute illness , liver abscess.  Improving , patient more alert and verbal.   Hyperbilirubinemia: secondary to liver abscess; Elevated ammonia level. US showed complex cyst liver.  CT abdomen showed liver abscess.  Bili trending down. Ammonia trending down. Repeat in am   Dysphagia; on dysphagia 3 diet nectar thick.   Stroke (cerebrum) [facility]): At baseline  Hypokalemia:Replacing. Mg normal   Code Status: DNR  Family Communication: POA [name] updated 2-20  Disposition Plan: Likely here for several days ,then snf   Consultants:  Interventional radiology  GI  Critical care  General surgery  Procedures: S/P perc drain for liver abscess by [facility] 12/13/2015 S/P Cholecystostomy drain by Loreta Ave 12/15/2015  Antibiotics:  IV Zosyn and vancomycin 2/13-present --- cipro    Objective: BP 101/54 mmHg  Pulse 82  Temp(Src) 98.1 F (36.7 C) (Oral)  Resp 16  Wt 86.637 kg (191 lb)  SpO2 97%  Intake/Output Summary (Last 24 hours) at 12/17/15 0802 Last data filed at 12/17/15 0742  Gross per 24 hour  Intake    835 ml  Output    125 ml  Net    710 ml   [employer]  12/12/15 1305  Weight: 86.637 kg (191 lb)    Exam:   General:   Alert, more verbal. Still confused. Unsure baseline  Cardiovascular: Regular rate and rhythm, S1-S2   Respiratory: Few rhonchi  Abdomen: Soft, obese, nontender, hypoactive bowel sounds ,  2 drain in place draining purulent, bloody material   Musculoskeletal: Trace pitting edema bilaterally    Data Reviewed: Basic Metabolic Panel:  Recent Labs Lab 12/12/15 0117 12/13/15 0230 12/14/15 0214 12/15/15 0546 12/16/15 0421  NA 141 138 137 133* 137  K 4.3 3.5 3.8 3.8 3.4*  CL 110 108 105 105 106  CO2 20* 21* 21* 21* 21*  GLUCOSE 149* 98 107* 112* 133*  BUN 5*  CREATININE 0.53* 0.51* 0.53* 0.48* 0.50*  CALCIUM 8.2* 7.9* 7.7* 7.6* 7.7*   Liver Function Tests:  Recent Labs Lab 12/11/15 0350 12/12/15 0117 12/14/15 0214 12/16/15 0421  AST 37 58* 45* 38  ALT 46 52 44 40  ALKPHOS 80 90 80 105  BILITOT 3.1* 2.6* 2.7* 1.5*  PROT 5.0* 5.1* 4.9* 5.1*  ALBUMIN 1.8* 1.8* 1.6* 1.8*   No results for input(s): LIPASE, AMYLASE in the last 168 hours.  Recent Labs Lab 12/10/15 1542 12/11/15 0350 12/12/15 0121 12/14/15 0214  AMMONIA 62* 56* 65* 44*   CBC:  Recent Labs Lab 12/11/15 0350 12/13/15 0230 12/14/15 0214 12/15/15 0546 12/16/15 0421  WBC 14.8* 13.7* 13.7* 11.9* 13.3*  HGB 12.9* 12.4* 12.3* 13.2 13.2  HCT 37.8* 37.0* 36.2* 38.7* 39.3  MCV 93.3 94.1 94.0 95.3 94.9  PLT 126* 215 233 329 407*   Cardiac Enzymes:   No results for input(s): CKTOTAL, CKMB, CKMBINDEX, TROPONINI in the last 168 hours. BNP (last 3 results)  Recent Labs  04/09/15 2133  BNP 118.4*    ProBNP (last 3 results) No results for input(s): PROBNP in the last 8760 hours.  CBG:  Recent Labs Lab 12/13/15 1303  GLUCAP 88    Recent Results (from the past 240 hour(s))  Culture, blood (routine x 2)     Status: None   Collection Time: 12/07/15  8:44 PM  Result Value Ref Range Status   Specimen Description BLOOD RIGHT ANTECUBITAL  Final   Special Requests BOTTLES DRAWN AEROBIC AND ANAEROBIC 5CC   Final   Culture  Setup Time   Final    GRAM NEGATIVE RODS IN BOTH AEROBIC AND ANAEROBIC BOTTLES CRITICAL RESULT CALLED TO, READ BACK BY AND VERIFIED WITH: Lydia Guiles  12/08/15 @ 1259 M VESTAL    Culture ENTEROBACTER CLOACAE KLEBSIELLA PNEUMONIAE   Final   Report Status 12/10/2015 FINAL  Final   Organism ID, Bacteria ENTEROBACTER CLOACAE  Final   Organism ID, Bacteria KLEBSIELLA PNEUMONIAE  Final      Susceptibility   Enterobacter cloacae - MIC*    CEFAZOLIN >=64 RESISTANT Resistant     CEFEPIME 2 SENSITIVE Sensitive     CEFTAZIDIME >=64 RESISTANT Resistant     CEFTRIAXONE >=64 RESISTANT Resistant     CIPROFLOXACIN <=0.25 SENSITIVE Sensitive     GENTAMICIN <=1 SENSITIVE Sensitive     IMIPENEM <=0.25 SENSITIVE Sensitive     TRIMETH/SULFA <=20 SENSITIVE Sensitive     PIP/TAZO >=128 RESISTANT Resistant     * ENTEROBACTER CLOACAE   Klebsiella pneumoniae - MIC*    AMPICILLIN 16 RESISTANT Resistant     CEFAZOLIN <=4 SENSITIVE Sensitive     CEFEPIME <=1 SENSITIVE Sensitive     CEFTAZIDIME <=1 SENSITIVE Sensitive  CEFTRIAXONE <=1 SENSITIVE Sensitive     CIPROFLOXACIN <=0.25 SENSITIVE Sensitive     GENTAMICIN <=1 SENSITIVE Sensitive     IMIPENEM <=0.25 SENSITIVE Sensitive     TRIMETH/SULFA <=20 SENSITIVE Sensitive     AMPICILLIN/SULBACTAM <=2 SENSITIVE Sensitive     PIP/TAZO <=4 SENSITIVE Sensitive     * KLEBSIELLA PNEUMONIAE  Culture, blood (routine x 2)     Status: None   Collection Time: 12/07/15  8:49 PM  Result Value Ref Range Status   Specimen Description BLOOD RIGHT ANTECUBITAL  Final   Special Requests BOTTLES DRAWN AEROBIC AND ANAEROBIC 5CC   Final   Culture  Setup Time   Final    GRAM NEGATIVE RODS ANAEROBIC BOTTLE ONLY CRITICAL RESULT CALLED TO, READ BACK BY AND VERIFIED WITH: B. BLAKE,RN AT 0909 ON [id number] BY Lucienne Capers    Culture   Final    KLEBSIELLA PNEUMONIAE SUSCEPTIBILITIES PERFORMED ON PREVIOUS CULTURE WITHIN THE LAST 5 DAYS.    Report Status 12/12/2015 FINAL  Final  Urine culture     Status: None   Collection Time: 12/07/15  9:18 PM  Result Value Ref Range Status   Specimen Description URINE, RANDOM  Final   Special  Requests NONE  Final   Culture NO GROWTH 1 DAY  Final   Report Status 12/08/2015 FINAL  Final  MRSA PCR Screening     Status: None   Collection Time: 12/09/15 10:14 AM  Result Value Ref Range Status   MRSA by PCR NEGATIVE NEGATIVE Final    Comment:        The GeneXpert MRSA Assay (FDA approved for NASAL specimens only), is one component of a comprehensive MRSA colonization surveillance program. It is not intended to diagnose MRSA infection nor to guide or monitor treatment for MRSA infections.   Culture, blood (routine x 2)     Status: None (Preliminary result)   Collection Time: 12/12/15 12:48 AM  Result Value Ref Range Status   Specimen Description BLOOD RIGHT ARM  Final   Special Requests AEROBIC BOTTLE ONLY  Final   Culture  Setup Time   Final    GRAM POSITIVE COCCI IN CLUSTERS AEROBIC BOTTLE ONLY CRITICAL RESULT CALLED TO, READ BACK BY AND VERIFIED WITH: M SPRAGUE,RN  12/17/15 MKELLY    Culture NO GROWTH 4 DAYS  Final   Report Status PENDING  Incomplete  Culture, blood (routine x 2)     Status: None (Preliminary result)   Collection Time: 12/12/15 12:54 AM  Result Value Ref Range Status   Specimen Description BLOOD RIGHT ARM  Final   Special Requests AEROBIC BOTTLE ONLY  Final   Culture NO GROWTH 4 DAYS  Final   Report Status PENDING  Incomplete  Culture, routine-abscess     Status: None (Preliminary result)   Collection Time: 12/13/15 10:35 AM  Result Value Ref Range Status   Specimen Description ABSCESS  Final   Special Requests LIVER  Final   Gram Stain   Final    ABUNDANT WBC PRESENT,BOTH PMN AND MONONUCLEAR FEW SQUAMOUS EPITHELIAL CELLS PRESENT NO ORGANISMS SEEN Performed at [facility]    Culture   Final    MODERATE GRAM NEGATIVE RODS Performed at [facility]    Report Status PENDING  Incomplete  Culture, routine-abscess     Status: None (Preliminary result)   Collection Time: 12/15/15  4:08 PM  Result Value Ref Range  Status   Specimen Description GALL BLADDER  Final  Special Requests NONE  Final   Gram Stain   Final    MODERATE WBC PRESENT,BOTH PMN AND MONONUCLEAR NO SQUAMOUS EPITHELIAL CELLS SEEN NO ORGANISMS SEEN Performed at [facility]    Culture   Final    NO GROWTH 1 DAY Performed at [facility]    Report Status PENDING  Incomplete     Studies: No results found.  Scheduled Meds: . antiseptic oral rinse  7 mL Mouth Rinse q12n4p  . chlorhexidine  15 mL Mouth Rinse BID  . ciprofloxacin  400 mg Intravenous Q12H  . enoxaparin (LOVENOX) injection  40 mg Subcutaneous Q24H  . feeding supplement (ENSURE ENLIVE)  237 mL Oral BID BM  . lactulose  20 g Oral BID    Continuous Infusions: . sodium chloride 50 mL/hr at 12/17/15 1191     Time spent: 35 minutes  Zanae Kuehnle MD PhD  [facility] Pager [phone] . If 7PM-7AM, please contact night-coverage at [email or web address], password [facility] 12/17/2015, 8:02 AM  LOS: 9 days

## 2015-12-17 NOTE — Progress Notes (Signed)
Speech Language Pathology Dysphagia Treatment Patient Details Name: Howard Rhodes MRN: [id number] DOB: Mar 25, 1931 Today's Date: 12/17/2015 Time: 6295-2841 SLP Time Calculation (min) (ACUTE ONLY): 16 min  Assessment / Plan / Recommendation Clinical Impression    Pt continues to be more alert and aware compared to last week. Pt exhibited no evidence of dysphagia and no s/s of penetration/aspiration. No c/o of globus sensation. SLP provided min verbal cues to attend during session. Pt educated re: diet recommendation. Recommend Dysphagia 3 diet, nectar thick liquids and meds whole in puree. SLP will f/u to determine diet toleration.    Diet Recommendation    Dysphagia 3 (mechanical soft) diet, nectar thick liquids, meds whole in puree   SLP Plan Continue with current plan of care      Swallowing Goals     General Behavior/Cognition: Alert;Cooperative;Pleasant mood Patient Positioning: Upright in bed Oral care provided: N/A HPI: 82 year old male with past medical history of depression, stroke which has left him paraplegic who was brought in by wife on night of 2/12 after he had been confused and disoriented for several days as well as noted to be diaphoretic.  Dx sepsis secondary to CAP, acute encephalopathy. Followed by SLP services June 2016 admission - concerns for choking incidents with dry solids; he was D/Cd on a dysphagia 3 diet with thin liquids.   Oral Cavity - Oral Hygiene     Dysphagia Treatment Treatment Methods: Skilled observation;Compensation strategy training;Patient/caregiver education;Differential diagnosis Patient observed directly with PO's: Yes Type of PO's observed: Dysphagia 3 (soft);Nectar-thick liquids Feeding: Total assist Liquids provided via: Cup;No straw Oral Phase Signs & Symptoms:  (none) Pharyngeal Phase Signs & Symptoms:  (none) Type of cueing: Verbal Amount of cueing: Minimal   GO     Howard Rhodes 12/17/2015, 1:21 PM   Howard Rhodes,  Student-SLP

## 2015-12-17 NOTE — Progress Notes (Signed)
Referring Physician(s): Dr Roda Shutters  Chief Complaint:  Liver abscess drain placed 2/18 Perc chole drain placed 2/20  Subjective:  Better daily Less pain; eating better Still confusion Output good for each afeb   Allergies: Review of patient's allergies indicates no known allergies.  Medications: Prior to Admission medications   Medication Sig Start Date End Date Taking? Authorizing Provider  atorvastatin (LIPITOR) 40 MG tablet Take 40 mg by mouth daily at 6 PM.   Yes Historical Provider, MD  furosemide (LASIX) 20 MG tablet Take 1 tablet (20 mg total) by mouth daily. 04/18/15  Yes Dorothea Ogle, MD  cefUROXime (CEFTIN) 500 MG tablet Take 1 tablet (500 mg total) by mouth 2 (two) times daily with a meal. Patient not taking: Reported on 12/07/2015 04/18/15   Dorothea Ogle, MD  HYDROcodone-acetaminophen (NORCO/VICODIN) 5-325 MG per tablet Take 1 tablet by mouth every 6 (six) hours as needed for pain. Patient not taking: Reported on 12/07/2015 04/16/13   Vassie Loll, MD  levalbuterol [facility]) 0.63 MG/3ML nebulizer solution Take 3 mLs (0.63 mg total) by nebulization every 6 (six) hours as needed for wheezing or shortness of breath. Patient not taking: Reported on 12/07/2015 04/18/15   Dorothea Ogle, MD     Vital Signs: BP 101/54 mmHg  Pulse 82  Temp(Src) 98.1 F (36.7 C) (Oral)  Resp 16  Wt 191 lb (86.637 kg)  SpO2 97%  Physical Exam  Abdominal: Soft. Bowel sounds are normal. There is no tenderness.  Neurological: He is alert.  Skin: Skin is warm and dry.  Sites of drains are clean and dry  Output liver abscess: 40 cc yesterday Purulent output in drain now; minimal amt + Klebsiella and enterobacter  Output chole drain is good Bile color 175 cc yesterday; 200 in bag now No growth  afeb Wbc 13.3  Nursing note and vitals reviewed.   Imaging: Nm Hepatobiliary Liver Func  12/14/2015  CLINICAL DATA:  History of hepatic abscess. Question of subacute cholecystitis  associated with abscess. EXAM: NUCLEAR MEDICINE HEPATOBILIARY IMAGING TECHNIQUE: Sequential images of the abdomen were obtained out to 60 minutes following intravenous administration of radiopharmaceutical. RADIOPHARMACEUTICALS:  5.4 mCi Tc-44m  Choletec IV COMPARISON:  Ultrasound 12/10/2015 FINDINGS: Initial imaging is performed following administration Choletec. After 45 minutes, of gallbladder is not visualized. Morphine was administered. Additional imaging is performed, failing to reveal gallbladder activity. IMPRESSION: Findings are consistent with acute cholecystitis. Electronically Signed   By: Norva Pavlov M.D.   On: 12/14/2015 12:41   Ir Perc Cholecystostomy  12/15/2015  INDICATION: 82 year old male with a history of acute cholecystitis and liver abscess. He has had liver abscess drained with percutaneous 10 French drain 12/13/2015. Nuclear medicine HIDA study confirms acute calculus cholecystitis. He has been referred for percutaneous cholecystostomy tube EXAM: CHOLECYSTOSTOMY MEDICATIONS: The patient is currently admitted to the hospital and receiving intravenous antibiotics. The antibiotics were administered within an appropriate time frame prior to the initiation of the procedure. ANESTHESIA/SEDATION: Fentanyl 25 mcg IV; Versed 0.5 mg IV Moderate Sedation Time:  13 The patient was continuously monitored during the procedure by the interventional radiology nurse under my direct supervision. COMPLICATIONS: None PROCEDURE: Informed written consent was obtained from the patient and the patient's family after a thorough discussion of the procedural risks, benefits and alternatives. All questions were addressed. Maximal Sterile Barrier Technique was utilized including caps, mask, sterile gowns, sterile gloves, sterile drape, hand hygiene and skin antiseptic. A timeout was performed prior to the initiation of  the procedure. Ultrasound survey of the right upper quadrant was performed for planning  purposes. Once the patient is prepped and draped in the usual sterile fashion, the skin and subcutaneous tissues overlying the gallbladder were generously infiltrated 1% lidocaine for local anesthesia. A coaxial needle was advanced under ultrasound guidance through the skin subcutaneous tissues and a small segment of liver into the gallbladder lumen. With removal of the stylet, spontaneous dark bile drainage occurred. Using modified Seldinger technique, a 10 French drain was placed into the gallbladder fossa, with aspiration of the sample for the lab. Contrast injection confirmed position of the tube within the gallbladder lumen. Drainage catheter was attached to gravity drain with a suture retention placed. Patient tolerated the procedure well and remained hemodynamically stable throughout. No complications were encountered and no significant blood loss encountered. IMPRESSION: Status post image guided percutaneous cholecystostomy tube. Sample was sent to the lab for analysis. Signed, Yvone Neu. Loreta Ave, DO [facility] Radiology Electronically Signed   By: Gilmer Mor D.O.   On: 12/15/2015 16:55    Labs:  CBC:  Recent Labs  12/13/15 0230 12/14/15 0214 12/15/15 0546 12/16/15 0421  WBC 13.7* 13.7* 11.9* 13.3*  HGB 12.4* 12.3* 13.2 13.2  HCT 37.0* 36.2* 38.7* 39.3  PLT 215 233 329 407*    COAGS:  Recent Labs  04/09/15 2133 12/08/15 0408 12/15/15 0849  INR 1.35 1.34 1.25  APTT 29 37 37    BMP:  Recent Labs  12/13/15 0230 12/14/15 0214 12/15/15 0546 12/16/15 0421  NA 138 137 133* 137  K 3.5 3.8 3.8 3.4*  CL 108 105 105 106  CO2 21* 21* 21* 21*  GLUCOSE 98 107* 112* 133*  BUN 5*  CALCIUM 7.9* 7.7* 7.6* 7.7*  CREATININE 0.51* 0.53* 0.48* 0.50*  GFRNONAA >60 >60 >60 >60  GFRAA >60 >60 >60 >60    LIVER FUNCTION TESTS:  Recent Labs  12/11/15 0350 12/12/15 0117 12/14/15 0214 12/16/15 0421  BILITOT 3.1* 2.6* 2.7*  1.5*  AST 37 58* 45* 38  ALT 46 52 44 40  ALKPHOS 80 90 80 105  PROT 5.0* 5.1* 4.9* 5.1*  ALBUMIN 1.8* 1.8* 1.6* 1.8*    Assessment and Plan:  Liver abscess drain intact Output slowing but still 40 cc or more daily  Chole drain intact Output good--- bile Will be in place 6-8 weeks  Plans per [facility]  Electronically Signed: Tamberly Pomplun A 12/17/2015, 9:32 AM   I spent a total of 15 Minutes at the the patient's bedside AND on the patient's hospital floor or unit, greater than 50% of which was counseling/coordinating care for perc chole drain and liver abscess drain

## 2015-12-17 NOTE — Progress Notes (Signed)
Pharmacy Antibiotic Note  Howard Rhodes is a 82 y.o. male admitted on 12/07/2015 with AMS and shortness of breath. Currently on ciprofloxacin (total abx Day #11) for enterobacter bacteremia and gallbladder abscess growing GNR. Pt was also on Zosyn, Vancomycin 2/12-2/15 for septic shock  Pharmacy has been consulted to restart Vancomycin for GPC in 1/2 blood cultures from 2/17.  Plan: Vancomycin 1gm IV q12h Will f/u micro data, renal function, and pt's clinical condition Vanc trough prn  Weight: 191 lb (86.637 kg)  Temp (24hrs), Avg:98.7 F (37.1 C), Min:98.1 F (36.7 C), Max:99 F (37.2 C)   Recent Labs Lab 12/11/15 0350 12/12/15 0117 12/13/15 0230 12/14/15 0214 12/15/15 0546 12/16/15 0421  WBC 14.8*  --  13.7* 13.7* 11.9* 13.3*  CREATININE 0.59* 0.53* 0.51* 0.53* 0.48* 0.50*    CrCl cannot be calculated (Unknown ideal weight.).    No Known Allergies  Antimicrobials this admission: 2/12 Vanc >> 2/15  2/12 Zosyn >> 2/15   Dose adjustments this admission: n/a  Microbiology results: 2/12 BCx x2- enterobacter (resistant to zosyn; sens to cipro. cefepime), kleb (sens to cipro, cefepime)  2/12 UCx: neg 2/18 gallbladder abscess: GNR 2/20 gallbaldder abscess: NGTD 2/17: Bld x 2: GPC in 1/2   Thank you for allowing pharmacy to be a part of this patient's care.  Lavonia Dana 12/17/2015 5:28 AM

## 2015-12-18 LAB — CBC
HCT: 37.5 % — ABNORMAL LOW (ref 39.0–52.0)
Hemoglobin: 12.8 g/dL — ABNORMAL LOW (ref 13.0–17.0)
MCH: 32.2 pg (ref 26.0–34.0)
MCHC: 34.1 g/dL (ref 30.0–36.0)
MCV: 94.5 fL (ref 78.0–100.0)
PLATELETS: 438 10*3/uL — AB (ref 150–400)
RBC: 3.97 MIL/uL — ABNORMAL LOW (ref 4.22–5.81)
RDW: 15.5 % (ref 11.5–15.5)
WBC: 9.6 10*3/uL (ref 4.0–10.5)

## 2015-12-18 LAB — COMPREHENSIVE METABOLIC PANEL
ALK PHOS: 98 U/L (ref 38–126)
ALT: 32 U/L (ref 17–63)
ANION GAP: 9 (ref 5–15)
AST: 34 U/L (ref 15–41)
Albumin: 1.8 g/dL — ABNORMAL LOW (ref 3.5–5.0)
BUN: <5 mg/dL — ABNORMAL LOW (ref 6–20)
CALCIUM: 7.7 mg/dL — AB (ref 8.9–10.3)
CHLORIDE: 104 mmol/L (ref 101–111)
CO2: 26 mmol/L (ref 22–32)
Creatinine, Ser: 0.53 mg/dL — ABNORMAL LOW (ref 0.61–1.24)
GFR calc non Af Amer: >60 mL/min (ref >60)
Glucose, Bld: 101 mg/dL — ABNORMAL HIGH (ref 65–99)
POTASSIUM: 3.6 mmol/L (ref 3.5–5.1)
SODIUM: 139 mmol/L (ref 135–145)
Total Bilirubin: 0.8 mg/dL (ref 0.3–1.2)
Total Protein: 5.2 g/dL — ABNORMAL LOW (ref 6.5–8.1)

## 2015-12-18 LAB — CULTURE, ROUTINE-ABSCESS: CULTURE: NO GROWTH

## 2015-12-18 LAB — CULTURE, BLOOD (ROUTINE X 2)

## 2015-12-18 LAB — MAGNESIUM: Magnesium: 2.1 mg/dL (ref 1.7–2.4)

## 2015-12-18 LAB — AMMONIA: Ammonia: 27 umol/L (ref 9–35)

## 2015-12-18 NOTE — Progress Notes (Signed)
Speech Language Pathology Dysphagia Treatment Patient Details Name: Howard Rhodes MRN: [id number] DOB: 1930/11/21 Today's Date: 12/18/2015 Time: 1211-1232 SLP Time Calculation (min) (ACUTE ONLY): 21 min  Assessment / Plan / Recommendation Clinical Impression    Pt exhibited no s/s of penetration/aspiration. Pt appears to be tolerating current diet and liquids well. Due to improved mental status over past few days, another instrumental study necessary to determine possibility of diet/liquid upgrade. Pt educated re: instrumental testing and diet recommendation of Dysphagia 3 and nectar thick liquids. SLP will f/u for MBS.    Diet Recommendation    Dysphagia 3 diet, nectar thick liquids, meds whole in puree   SLP Plan MBS;New goals to be determined pending instrumental study      Swallowing Goals     General Behavior/Cognition: Alert;Cooperative;Pleasant mood Patient Positioning: Upright in bed Oral care provided: Yes HPI: 82 year old male with past medical history of depression, stroke which has left him paraplegic who was brought in by wife on night of 2/12 after he had been confused and disoriented for several days as well as noted to be diaphoretic.  Dx sepsis secondary to CAP, acute encephalopathy. Followed by SLP services June 2016 admission - concerns for choking incidents with dry solids; he was D/Cd on a dysphagia 3 diet with thin liquids.   Oral Cavity - Oral Hygiene     Dysphagia Treatment Treatment Methods: Skilled observation;Compensation strategy training;Patient/caregiver education Patient observed directly with PO's: Yes Type of PO's observed: Dysphagia 3 (soft);Nectar-thick liquids Feeding: Total assist Liquids provided via: Cup;No straw Oral Phase Signs & Symptoms:  (none) Pharyngeal Phase Signs & Symptoms:  (none) Type of cueing: Verbal Amount of cueing: Minimal   GO     Howard Rhodes 12/18/2015, 2:45 PM    Lynita Lombard, Student-SLP

## 2015-12-18 NOTE — Care Management Note (Signed)
Case Management Note Donn Pierini RN, BSN Unit 2W-Case Manager [phone]  Patient Details  Name: Howard Rhodes MRN: [id number] Date of Birth: [birth year]  Subjective/Objective:    Pt admitted with sepsis- liver abscess and cholecystitis with drain placements-                 Action/Plan: PTA pt lived at home with wife- pt is bedbound at baseline- per conversation with wife- pt already has hospital bed at home and all other needed equipment- pt will need non emergent EMS transport home- wife states that she plans to take pt back home- has no plans for SNF reports that she can care for pt at home- pt will go home with chole drain- may need HHRN to assist with tube management at home- provided pt's wife with list of [facility] agencies for [facility].  NCM to follow for d/c needs/planning.   Expected Discharge Date:                  Expected Discharge Plan:  Home w Home Health Services  In-House Referral:  Clinical Social Work  Discharge planning Services  CM Consult  Post Acute Care Choice:  Home Health Choice offered to:  Spouse  DME Arranged:    DME Agency:     HH Arranged:    HH Agency:     Status of Service:  In process, will continue to follow  Medicare Important Message Given:  Yes Date Medicare IM Given:    Medicare IM give by:    Date Additional Medicare IM Given:    Additional Medicare Important Message give by:     If discussed at Long Length of Stay Meetings, dates discussed:  12/18/15  Additional Comments:  Darrold Span, RN 12/18/2015, 3:14 PM

## 2015-12-18 NOTE — Progress Notes (Signed)
  Subjective: Denies pain  Objective: Vital signs in last 24 hours: Temp:  [98.2 F (36.8 C)-99 F (37.2 C)] 98.6 F (37 C) (02/23 0443) Pulse Rate:  [77-96] 77 (02/23 0443) Resp:  [18] 18 (02/23 0443) BP: (95-133)/(50-86) 95/59 mmHg (02/23 0443) SpO2:  [95 %-96 %] 95 % (02/23 0443) Last BM Date: 12/16/15 696 PO Intake/Output from previous day: 02/22 0701 - 02/23 0700 In: 1081 [P.O.:696; I.V.:385] Out: 55 [Drains:55] Intake/Output this shift: Total I/O In: 120 [P.O.:120] Out: -   GI: soft, NT, both drains with bilious output  Lab Results:   Recent Labs  12/16/15 0421 12/18/15 0345  WBC 13.3* 9.6  HGB 13.2 12.8*  HCT 39.3 37.5*  PLT 407* 438*    BMET  Recent Labs  12/16/15 0421 12/18/15 0345  NA 137 139  K 3.4* 3.6  CL 106 104  CO2 21* 26  GLUCOSE 133* 101*  BUN 5* <5*  CREATININE 0.50* 0.53*  CALCIUM 7.7* 7.7*   PT/INR No results for input(s): LABPROT, INR in the last 72 hours.   Recent Labs Lab 12/12/15 0117 12/14/15 0214 12/16/15 0421 12/18/15 0345  AST 58* 45* 38 34  ALT 52 44 40 32  ALKPHOS 90 80 105 98  BILITOT 2.6* 2.7* 1.5* 0.8  PROT 5.1* 4.9* 5.1* 5.2*  ALBUMIN 1.8* 1.6* 1.8* 1.8*     Lipase  No results found for: LIPASE   Studies/Results: No results found.  Medications: . antiseptic oral rinse  7 mL Mouth Rinse q12n4p  . chlorhexidine  15 mL Mouth Rinse BID  . ciprofloxacin  400 mg Intravenous Q12H  . enoxaparin (LOVENOX) injection  40 mg Subcutaneous Q24H  . feeding supplement (ENSURE ENLIVE)  237 mL Oral BID BM  . lactulose  20 g Oral BID  . vancomycin  1,000 mg Intravenous Q12H    Assessment/Plan Liver abscess IR drain placement liver abscess, 12/13/15 Possible GB rupture into parenchyma IR PERC CHOLECYSTOSTOMY, 12/15/15 Septic shock Klebsiella enterobacter bacteremia Pneumonia Encephalopathy Hyperbilirubinemia Hx of CVA Dysphagia Hypokalemia DNR Antibiotics: 3 days of Zosyn completed 12/09/15, started on  Cipro 9/15, Vancomycin started 2 days ago DVT: Lovenox/ SCD    Please have him F/U with Dr. Lindie Spruce at [facility] after discharge regarding perc chole tube. Re-image/removal of liver abscess drain per IR.   LOS: 10 days    Rhodes,Howard 12/18/2015

## 2015-12-18 NOTE — Progress Notes (Signed)
PROGRESS NOTE  Howard Rhodes ZOX:[id number] DOB: 1931/02/20 DOA: 12/07/2015 PCP: Ailene Ravel, MD  HPI/Recap of past 24 hours: Patient is an 83 year old male with past medical history of depression, stroke which has left him paraplegic who was brought in by wife on night of 2/12 after he had been confused and disoriented times several days as well as noted to be diaphoretic. The emergency room, patient can have white count of 28, lactic acid level 6.24, febrile and hypotensive which initially required fluids and then briefly pressors. Chest x-ray noted multi lobar pneumonia.   Patient admitted to the hospital service for septic shock. Prior to leaving the emergency room, able to be weaned off pressor support and maintain stable blood pressures.  Follow-up lactic acid level down to 2.  2/2 bottles of blood cultures growing out gram-negative rods, klebsiella. Urinalysis negative. [place] and CT abdomen was performed due to elevated Bilirubin. Patient was found to have Liver abscess and HIDA scan was positive for cholecystitis. He underwent liver abscess drainage 2-18 and cholecystotomy tube placement  On 2-20 by IR.  Awaiting culture from liver abscess.   Patient alert , more verbal , able to say his name. Denies pain   Assessment/Plan:  Liver abscess, Gram-negative Septic shock (HCC), klebsiella, enteobacter Bacteremia (+blood culture on 2/12) Patient meets criteria for septic shock on admission given hypotension persisting despite fluid support and briefly requiring pressor support, lactic acidosis, marked leukocytosis, tachypnea and tachycardia with pulmonary source noted on chest x-ray. RUQ [place] complex cyst in the liver, adjacent to gall bladder.  CT consistent with 2 liver abscess. CT pelvis negative for diverticulitis. -S/P percutaneous drainage of liver abscess 2-18.  -HIDA scan positive  for cholecystitis. Surgery consulted, recommendation for  cholecystostomy tube.  - S/P Cholecystostomy drain  by Loreta Ave 12/15/2015 -Patient was initially treated with vanc/zosyn from admission, then abx Changed to cipro, both organism from blood culture and abscess culture sensitive to cipro.  -Blood culture drawn 2-17. 1/2 with staph, vanc was restarted, on 2/21,then d/ced on 2/23 due to culture result with staph coag negative , likely contamination, repeat blood culture on 2/22, no growth. -improving , will discuss with ID about duration of abx, likely able to d/c in am   Multilobar, broncho-PNA Continue with cipro. Weaned off oxygen, currently on room air Swallow eval rec:  Dysphagia 3 diet, nectar thick liquids, meds whole in puree  Encephalopathy; ammonia level elevated initially, normalized with  lactulose  Suspect related to acute illness , liver abscess.  Improving , patient more alert and verbal. Now likely baseline  Hyperbilirubinemia: secondary to liver abscess; Elevated ammonia level. US showed complex cyst liver.  CT abdomen showed liver abscess.  Bili trending down. Ammonia trending down. Repeat in am    Stroke (cerebrum) [facility]): At baseline, bed bound and total care for the last two years  Hypokalemia:Replacing. Mg normal   Code Status: DNR  Family Communication: POA [name] updated 2-20  Disposition Plan: home with home health per family request, patient does not want to go to snf.  Consultants:  Interventional radiology  GI  Critical care  General surgery  Procedures: S/P perc drain for liver abscess by [facility] 12/13/2015 S/P Cholecystostomy drain by Loreta Ave 12/15/2015  Antibiotics:  IV Zosyn and vancomycin from admission to 2/15   cipro from 2/15  vanc 2/21-2/23   Objective: BP 117/62 mmHg  Pulse 75  Temp(Src) 99.5 F (37.5 C) (Oral)  Resp 21  Wt 86.637 kg (191 lb)  SpO2  98%  Intake/Output Summary (Last 24 hours) at 12/18/15 1935 Last data filed at 12/18/15 1840  Gross per 24 hour  Intake    840 ml  Output    590 ml  Net    250 ml   Filed  Weights   12/12/15 1305  Weight: 86.637 kg (191 lb)    Exam:   General:   Alert, more verbal. Still confused. possible baseline  Cardiovascular: Regular rate and rhythm, S1-S2   Respiratory: Few rhonchi  Abdomen: Soft, obese, nontender, hypoactive bowel sounds , 2 drain in place with decreased drainage now serous with purulent stranding  Musculoskeletal: Trace pitting edema bilaterally , chronic bilateral foot drop  Data Reviewed: Basic Metabolic Panel:  Recent Labs Lab 12/13/15 0230 12/14/15 0214 12/15/15 0546 12/16/15 0421 12/18/15 0345  NA 138 137 133* 137 139  K 3.5 3.8 3.8 3.4* 3.6  CL 108 105 105 106 104  CO2 21* 21* 21* 21* 26  GLUCOSE 98 107* 112* 133* 101*  BUN 9 7 6  5* <5*  CREATININE 0.51* 0.53* 0.48* 0.50* 0.53*  CALCIUM 7.9* 7.7* 7.6* 7.7* 7.7*  MG  --   --   --   --  2.1   Liver Function Tests:  Recent Labs Lab 12/12/15 0117 12/14/15 0214 12/16/15 0421 12/18/15 0345  AST 58* 45* 38 34  ALT 52 44 40 32  ALKPHOS 90 80 105 98  BILITOT 2.6* 2.7* 1.5* 0.8  PROT 5.1* 4.9* 5.1* 5.2*  ALBUMIN 1.8* 1.6* 1.8* 1.8*   No results for input(s): LIPASE, AMYLASE in the last 168 hours.  Recent Labs Lab 12/12/15 0121 12/14/15 0214 12/17/15 1147 12/18/15 0345  AMMONIA 65* 44* 46* 27   CBC:  Recent Labs Lab 12/13/15 0230 12/14/15 0214 12/15/15 0546 12/16/15 0421 12/18/15 0345  WBC 13.7* 13.7* 11.9* 13.3* 9.6  HGB 12.4* 12.3* 13.2 13.2 12.8*  HCT 37.0* 36.2* 38.7* 39.3 37.5*  MCV 94.1 94.0 95.3 94.9 94.5  PLT 215 233 329 407* 438*   Cardiac Enzymes:   No results for input(s): CKTOTAL, CKMB, CKMBINDEX, TROPONINI in the last 168 hours. BNP (last 3 results)  Recent Labs  04/09/15 2133  BNP 118.4*    ProBNP (last 3 results) No results for input(s): PROBNP in the last 8760 hours.  CBG:  Recent Labs Lab 12/13/15 1303  GLUCAP 88    Recent Results (from the past 240 hour(s))  MRSA PCR Screening     Status: None   Collection Time:  12/09/15 10:14 AM  Result Value Ref Range Status   MRSA by PCR NEGATIVE NEGATIVE Final    Comment:        The GeneXpert MRSA Assay (FDA approved for NASAL specimens only), is one component of a comprehensive MRSA colonization surveillance program. It is not intended to diagnose MRSA infection nor to guide or monitor treatment for MRSA infections.   Culture, blood (routine x 2)     Status: None   Collection Time: 12/12/15 12:48 AM  Result Value Ref Range Status   Specimen Description BLOOD RIGHT ARM  Final   Special Requests AEROBIC BOTTLE ONLY  Final   Culture  Setup Time   Final    GRAM POSITIVE COCCI IN CLUSTERS AEROBIC BOTTLE ONLY CRITICAL RESULT CALLED TO, READ BACK BY AND VERIFIED WITH: M SPRAGUE,RN @0500  12/17/15 MKELLY    Culture   Final    STAPHYLOCOCCUS SPECIES (COAGULASE NEGATIVE) THE SIGNIFICANCE OF ISOLATING THIS ORGANISM FROM A SINGLE SET OF BLOOD  CULTURES WHEN MULTIPLE SETS ARE DRAWN IS UNCERTAIN. PLEASE NOTIFY THE MICROBIOLOGY DEPARTMENT WITHIN ONE WEEK IF SPECIATION AND SENSITIVITIES ARE REQUIRED.    Report Status 12/18/2015 FINAL  Final  Culture, blood (routine x 2)     Status: None   Collection Time: 12/12/15 12:54 AM  Result Value Ref Range Status   Specimen Description BLOOD RIGHT ARM  Final   Special Requests AEROBIC BOTTLE ONLY  Final   Culture NO GROWTH 5 DAYS  Final   Report Status 12/17/2015 FINAL  Final  Culture, routine-abscess     Status: None   Collection Time: 12/13/15 10:35 AM  Result Value Ref Range Status   Specimen Description ABSCESS  Final   Special Requests LIVER  Final   Gram Stain   Final    ABUNDANT WBC PRESENT,BOTH PMN AND MONONUCLEAR FEW SQUAMOUS EPITHELIAL CELLS PRESENT NO ORGANISMS SEEN Performed at [facility]    Culture   Final    MODERATE KLEBSIELLA PNEUMONIAE MODERATE ENTEROBACTER CLOACAE Performed at [facility]    Report Status 12/17/2015 FINAL  Final   Organism ID, Bacteria KLEBSIELLA  PNEUMONIAE  Final   Organism ID, Bacteria ENTEROBACTER CLOACAE  Final      Susceptibility   Enterobacter cloacae - MIC*    CEFAZOLIN >=64 RESISTANT Resistant     CEFEPIME <=1 SENSITIVE Sensitive     CEFTAZIDIME >=64 RESISTANT Resistant     CEFTRIAXONE >=64 RESISTANT Resistant     CIPROFLOXACIN <=0.25 SENSITIVE Sensitive     GENTAMICIN <=1 SENSITIVE Sensitive     IMIPENEM 0.5 SENSITIVE Sensitive     PIP/TAZO >=128 RESISTANT Resistant     TOBRAMYCIN <=1 SENSITIVE Sensitive     TRIMETH/SULFA Value in next row Sensitive      <=20 SENSITIVE(NOTE)    * MODERATE ENTEROBACTER CLOACAE   Klebsiella pneumoniae - MIC*    AMPICILLIN Value in next row       <=20 SENSITIVE(NOTE)    AMPICILLIN/SULBACTAM Value in next row Sensitive      <=20 SENSITIVE(NOTE)    CEFEPIME Value in next row Sensitive      <=20 SENSITIVE(NOTE)    CEFTAZIDIME Value in next row Sensitive      <=20 SENSITIVE(NOTE)    CEFTRIAXONE Value in next row Sensitive      <=20 SENSITIVE(NOTE)    CIPROFLOXACIN Value in next row Sensitive      <=20 SENSITIVE(NOTE)    GENTAMICIN Value in next row Sensitive      <=20 SENSITIVE(NOTE)    IMIPENEM Value in next row Sensitive      <=20 SENSITIVE(NOTE)    PIP/TAZO Value in next row Sensitive      <=20 SENSITIVE(NOTE)    TOBRAMYCIN Value in next row Sensitive      <=20 SENSITIVE(NOTE)    TRIMETH/SULFA Value in next row Sensitive      <=20 SENSITIVE(NOTE)    * MODERATE KLEBSIELLA PNEUMONIAE  Culture, routine-abscess     Status: None   Collection Time: 12/15/15  4:08 PM  Result Value Ref Range Status   Specimen Description GALL BLADDER  Final   Special Requests NONE  Final   Gram Stain   Final    MODERATE WBC PRESENT,BOTH PMN AND MONONUCLEAR NO SQUAMOUS EPITHELIAL CELLS SEEN NO ORGANISMS SEEN Performed at [facility]    Culture   Final    NO GROWTH 2 DAYS Performed at [facility]    Report Status 12/18/2015 FINAL  Final  Culture, blood (  routine x 2)      Status: None (Preliminary result)   Collection Time: 12/17/15 12:00 PM  Result Value Ref Range Status   Specimen Description BLOOD LEFT HAND  Final   Special Requests BOTTLES DRAWN AEROBIC ONLY 5CC  Final   Culture NO GROWTH 1 DAY  Final   Report Status PENDING  Incomplete  Culture, blood (routine x 2)     Status: None (Preliminary result)   Collection Time: 12/17/15 12:05 PM  Result Value Ref Range Status   Specimen Description BLOOD RIGHT HAND  Final   Special Requests BOTTLES DRAWN AEROBIC ONLY 3CC  Final   Culture NO GROWTH 1 DAY  Final   Report Status PENDING  Incomplete     Studies: No results found.  Scheduled Meds: . antiseptic oral rinse  7 mL Mouth Rinse q12n4p  . chlorhexidine  15 mL Mouth Rinse BID  . ciprofloxacin  400 mg Intravenous Q12H  . enoxaparin (LOVENOX) injection  40 mg Subcutaneous Q24H  . feeding supplement (ENSURE ENLIVE)  237 mL Oral BID BM  . lactulose  20 g Oral BID    Continuous Infusions:     Time spent: 35 minutes  Monique Gift MD PhD  [facility] Pager [phone] . If 7PM-7AM, please contact night-coverage at [email or web address], password [facility] 12/18/2015, 7:35 PM  LOS: 10 days

## 2015-12-18 NOTE — Progress Notes (Signed)
Referring Physician(s): Dr Roda Shutters  Chief Complaint:  Liver abscess drain placed 2/18 Perc chole drain placed 2/20  Subjective:  Less pain; eating better Still confusion   Allergies: Review of patient's allergies indicates no known allergies.  Medications:  Current facility-administered medications:  .  0.9 %  sodium chloride infusion, , Intravenous, Continuous, Belkys A Regalado, MD, Last Rate: 50 mL/hr at 12/17/15 0742 .  antiseptic oral rinse (CPC / CETYLPYRIDINIUM CHLORIDE 0.05%) solution 7 mL, 7 mL, Mouth Rinse, q12n4p, Hollice Espy, MD, 7 mL at 12/17/15 1600 .  chlorhexidine (PERIDEX) 0.12 % solution 15 mL, 15 mL, Mouth Rinse, BID, Hollice Espy, MD, 15 mL at 12/18/15 0845 .  ciprofloxacin (CIPRO) IVPB 400 mg, 400 mg, Intravenous, Q12H, Belkys A Regalado, MD, 400 mg at 12/17/15 2030 .  enoxaparin (LOVENOX) injection 40 mg, 40 mg, Subcutaneous, Q24H, Marquita Palms, RPH, 40 mg at 12/18/15 0845 .  feeding supplement (ENSURE ENLIVE) (ENSURE ENLIVE) liquid 237 mL, 237 mL, Oral, BID BM, Belkys A Regalado, MD, 237 mL at 12/18/15 0845 .  lactulose (CHRONULAC) 10 GM/15ML solution 20 g, 20 g, Oral, BID, Belkys A Regalado, MD, 20 g at 12/18/15 0845 .  levalbuterol (XOPENEX) nebulizer solution 1.25 mg, 1.25 mg, Nebulization, Q6H PRN, Lorretta Harp, MD .  vancomycin (VANCOCIN) IVPB 1000 mg/200 mL premix, 1,000 mg, Intravenous, Q12H, Almon Hercules, RPH, 1,000 mg at 12/18/15 0844    Vital Signs: BP 95/59 mmHg  Pulse 77  Temp(Src) 98.6 F (37 C) (Oral)  Resp 18  Wt 191 lb (86.637 kg)  SpO2 95%  Physical Exam  Abdominal: Soft. Bowel sounds are normal. There is no tenderness.  Neurological: He is alert.  Skin: Skin is warm and dry.  Sites of drains are clean and dry  Output liver abscess: serous with purulent stranding   Output chole drain is good Bile color   Nursing note and vitals reviewed.   Imaging: Nm Hepatobiliary Liver Func  12/14/2015  CLINICAL DATA:   History of hepatic abscess. Question of subacute cholecystitis associated with abscess. EXAM: NUCLEAR MEDICINE HEPATOBILIARY IMAGING TECHNIQUE: Sequential images of the abdomen were obtained out to 60 minutes following intravenous administration of radiopharmaceutical. RADIOPHARMACEUTICALS:  5.4 mCi Tc-69m  Choletec IV COMPARISON:  Ultrasound 12/10/2015 FINDINGS: Initial imaging is performed following administration Choletec. After 45 minutes, of gallbladder is not visualized. Morphine was administered. Additional imaging is performed, failing to reveal gallbladder activity. IMPRESSION: Findings are consistent with acute cholecystitis. Electronically Signed   By: Norva Pavlov M.D.   On: 12/14/2015 12:41   Ir Perc Cholecystostomy  12/15/2015  INDICATION: 82 year old male with a history of acute cholecystitis and liver abscess. He has had liver abscess drained with percutaneous 10 French drain 12/13/2015. Nuclear medicine HIDA study confirms acute calculus cholecystitis. He has been referred for percutaneous cholecystostomy tube EXAM: CHOLECYSTOSTOMY MEDICATIONS: The patient is currently admitted to the hospital and receiving intravenous antibiotics. The antibiotics were administered within an appropriate time frame prior to the initiation of the procedure. ANESTHESIA/SEDATION: Fentanyl 25 mcg IV; Versed 0.5 mg IV Moderate Sedation Time:  13 The patient was continuously monitored during the procedure by the interventional radiology nurse under my direct supervision. COMPLICATIONS: None PROCEDURE: Informed written consent was obtained from the patient and the patient's family after a thorough discussion of the procedural risks, benefits and alternatives. All questions were addressed. Maximal Sterile Barrier Technique was utilized including caps, mask, sterile gowns, sterile gloves, sterile drape, hand hygiene and skin antiseptic.  A timeout was performed prior to the initiation of the procedure. Ultrasound survey  of the right upper quadrant was performed for planning purposes. Once the patient is prepped and draped in the usual sterile fashion, the skin and subcutaneous tissues overlying the gallbladder were generously infiltrated 1% lidocaine for local anesthesia. A coaxial needle was advanced under ultrasound guidance through the skin subcutaneous tissues and a small segment of liver into the gallbladder lumen. With removal of the stylet, spontaneous dark bile drainage occurred. Using modified Seldinger technique, a 10 French drain was placed into the gallbladder fossa, with aspiration of the sample for the lab. Contrast injection confirmed position of the tube within the gallbladder lumen. Drainage catheter was attached to gravity drain with a suture retention placed. Patient tolerated the procedure well and remained hemodynamically stable throughout. No complications were encountered and no significant blood loss encountered. IMPRESSION: Status post image guided percutaneous cholecystostomy tube. Sample was sent to the lab for analysis. Signed, Yvone Neu. Loreta Ave, DO [facility] Radiology Electronically Signed   By: Gilmer Mor D.O.   On: 12/15/2015 16:55    Labs:  CBC:  Recent Labs  12/14/15 0214 12/15/15 0546 12/16/15 0421 12/18/15 0345  WBC 13.7* 11.9* 13.3* 9.6  HGB 12.3* 13.2 13.2 12.8*  HCT 36.2* 38.7* 39.3 37.5*  PLT 233 329 407* 438*    COAGS:  Recent Labs  04/09/15 2133 12/08/15 0408 12/15/15 0849  INR 1.35 1.34 1.25  APTT 29 37 37    BMP:  Recent Labs  12/14/15 0214 12/15/15 0546 12/16/15 0421 12/18/15 0345  NA 137 133* 137 139  K 3.8 3.8 3.4* 3.6  CL 105 105 106 104  CO2 21* 21* 21* 26  GLUCOSE 107* 112* 133* 101*  BUN 7 6 5* <5*  CALCIUM 7.7* 7.6* 7.7* 7.7*  CREATININE 0.53* 0.48* 0.50* 0.53*  GFRNONAA >60 >60 >60 >60  GFRAA >60 >60 >60 >60    LIVER FUNCTION TESTS:  Recent Labs  12/12/15 0117 12/14/15 0214  12/16/15 0421 12/18/15 0345  BILITOT 2.6* 2.7* 1.5* 0.8  AST 58* 45* 38 34  ALT 52 44 40 32  ALKPHOS 90 80 105 98  PROT 5.1* 4.9* 5.1* 5.2*  ALBUMIN 1.8* 1.6* 1.8* 1.8*    Assessment and Plan:  S/p Liver abscess drain-intact Output slowing  S/p perc chole drain intact Output good--- bile Will be in place 6-8 weeks  Plans per [facility]  Electronically Signed: Brayton El 12/18/2015, 10:45 AM   I spent a total of 15 Minutes at the the patient's bedside AND on the patient's hospital floor or unit, greater than 50% of which was counseling/coordinating care for perc chole drain and liver abscess drain

## 2015-12-19 ENCOUNTER — Other Ambulatory Visit: Payer: Self-pay | Admitting: Radiology

## 2015-12-19 ENCOUNTER — Inpatient Hospital Stay (HOSPITAL_COMMUNITY): Payer: Medicare Other

## 2015-12-19 DIAGNOSIS — R6521 Severe sepsis with septic shock: Secondary | ICD-10-CM | POA: Diagnosis not present

## 2015-12-19 DIAGNOSIS — L0291 Cutaneous abscess, unspecified: Secondary | ICD-10-CM | POA: Diagnosis not present

## 2015-12-19 DIAGNOSIS — K810 Acute cholecystitis: Secondary | ICD-10-CM | POA: Diagnosis not present

## 2015-12-19 DIAGNOSIS — Z434 Encounter for attention to other artificial openings of digestive tract: Secondary | ICD-10-CM | POA: Diagnosis not present

## 2015-12-19 DIAGNOSIS — E785 Hyperlipidemia, unspecified: Secondary | ICD-10-CM | POA: Diagnosis not present

## 2015-12-19 DIAGNOSIS — E876 Hypokalemia: Secondary | ICD-10-CM | POA: Diagnosis not present

## 2015-12-19 DIAGNOSIS — R1310 Dysphagia, unspecified: Secondary | ICD-10-CM | POA: Diagnosis not present

## 2015-12-19 DIAGNOSIS — R262 Difficulty in walking, not elsewhere classified: Secondary | ICD-10-CM | POA: Diagnosis not present

## 2015-12-19 DIAGNOSIS — R279 Unspecified lack of coordination: Secondary | ICD-10-CM | POA: Diagnosis not present

## 2015-12-19 DIAGNOSIS — R7881 Bacteremia: Secondary | ICD-10-CM | POA: Diagnosis not present

## 2015-12-19 DIAGNOSIS — J189 Pneumonia, unspecified organism: Secondary | ICD-10-CM | POA: Diagnosis not present

## 2015-12-19 DIAGNOSIS — I2510 Atherosclerotic heart disease of native coronary artery without angina pectoris: Secondary | ICD-10-CM | POA: Diagnosis not present

## 2015-12-19 DIAGNOSIS — I714 Abdominal aortic aneurysm, without rupture: Secondary | ICD-10-CM | POA: Diagnosis not present

## 2015-12-19 DIAGNOSIS — I77811 Abdominal aortic ectasia: Secondary | ICD-10-CM | POA: Diagnosis not present

## 2015-12-19 DIAGNOSIS — I69398 Other sequelae of cerebral infarction: Secondary | ICD-10-CM | POA: Diagnosis not present

## 2015-12-19 DIAGNOSIS — I6789 Other cerebrovascular disease: Secondary | ICD-10-CM | POA: Diagnosis not present

## 2015-12-19 DIAGNOSIS — Z4803 Encounter for change or removal of drains: Secondary | ICD-10-CM | POA: Diagnosis not present

## 2015-12-19 DIAGNOSIS — I700 Atherosclerosis of aorta: Secondary | ICD-10-CM | POA: Diagnosis not present

## 2015-12-19 DIAGNOSIS — M4856XA Collapsed vertebra, not elsewhere classified, lumbar region, initial encounter for fracture: Secondary | ICD-10-CM | POA: Diagnosis not present

## 2015-12-19 DIAGNOSIS — A419 Sepsis, unspecified organism: Secondary | ICD-10-CM | POA: Diagnosis not present

## 2015-12-19 DIAGNOSIS — K750 Abscess of liver: Secondary | ICD-10-CM | POA: Diagnosis not present

## 2015-12-19 DIAGNOSIS — R1311 Dysphagia, oral phase: Secondary | ICD-10-CM | POA: Diagnosis not present

## 2015-12-19 DIAGNOSIS — G9340 Encephalopathy, unspecified: Secondary | ICD-10-CM | POA: Diagnosis not present

## 2015-12-19 DIAGNOSIS — I69991 Dysphagia following unspecified cerebrovascular disease: Secondary | ICD-10-CM | POA: Diagnosis not present

## 2015-12-19 DIAGNOSIS — M6281 Muscle weakness (generalized): Secondary | ICD-10-CM | POA: Diagnosis not present

## 2015-12-19 LAB — BASIC METABOLIC PANEL
Anion gap: 11 (ref 5–15)
BUN: 6 mg/dL (ref 6–20)
CO2: 24 mmol/L (ref 22–32)
Calcium: 8.1 mg/dL — ABNORMAL LOW (ref 8.9–10.3)
Chloride: 103 mmol/L (ref 101–111)
Creatinine, Ser: 0.54 mg/dL — ABNORMAL LOW (ref 0.61–1.24)
GFR calc Af Amer: >60 mL/min (ref >60)
GFR calc non Af Amer: >60 mL/min (ref >60)
Glucose, Bld: 113 mg/dL — ABNORMAL HIGH (ref 65–99)
POTASSIUM: 4.2 mmol/L (ref 3.5–5.1)
SODIUM: 138 mmol/L (ref 135–145)

## 2015-12-19 LAB — CBC
HCT: 38.2 % — ABNORMAL LOW (ref 39.0–52.0)
Hemoglobin: 13.3 g/dL (ref 13.0–17.0)
MCH: 32.8 pg (ref 26.0–34.0)
MCHC: 34.8 g/dL (ref 30.0–36.0)
MCV: 94.3 fL (ref 78.0–100.0)
PLATELETS: 513 10*3/uL — AB (ref 150–400)
RBC: 4.05 MIL/uL — AB (ref 4.22–5.81)
RDW: 15.7 % — AB (ref 11.5–15.5)
WBC: 10.4 10*3/uL (ref 4.0–10.5)

## 2015-12-19 MED ORDER — CIPROFLOXACIN HCL 500 MG PO TABS: 500.0000 mg | ORAL_TABLET | Freq: Two times a day (BID) | ORAL | Status: AC

## 2015-12-19 MED ORDER — ENSURE ENLIVE PO LIQD: 237.0000 mL | Freq: Two times a day (BID) | ORAL | Status: AC

## 2015-12-19 MED ORDER — LACTULOSE 10 GM/15ML PO SOLN: 20.0000 g | Freq: Every day | ORAL | Status: AC

## 2015-12-19 NOTE — Clinical Social Work Note (Signed)
Clinical Social Work Assessment  Patient Details  Name: Howard Rhodes MRN: [id number] Date of Birth: 01/13/1931  Date of referral:  12/19/15               Reason for consult:  Facility Placement                Permission sought to share information with:  Facility Medical sales representative, Family Supports Permission granted to share information::  No (Patient disoriented; completed assessment w/ HCPOA Gwen)  Name::     [employer]  Agency::  [facility]  Relationship::  Daughter-in-law  Contact Information:  [phone]  Housing/Transportation Living arrangements for the past 2 months:  Single Family Home Source of Information:  Power of Attorney Patient Interpreter Needed:  None Criminal Activity/Legal Involvement Pertinent to Current Situation/Hospitalization:  No - Comment as needed Significant Relationships:  Adult Children, Spouse, Other Family Members Lives with:  Spouse Do you feel safe going back to the place where you live?  No Need for family participation in patient care:  Yes (Comment)  Care giving concerns:  CSW received referral for possible SNF placement at time of discharge. Patient is disoriented and bed-bound. CSW spoke to patient's daughter-in-law (HCPOA) Gwen. Gwen reported that patient's wife is currently unable to care for patient at their home given patient's current physical needs. Patient's daughter-in-law expressed understanding of SNF process and is agreeable to SNF placement at time of discharge. CSW to continue to follow and assist with discharge planning needs.   Social Worker assessment / plan:  CSW spoke with patient's daughter-in-law concerning possibility of rehab at [facility] before returning home.  Employment status:  Retired Database administrator PT Recommendations:  No Follow Up Information / Referral to community resources:  Skilled Nursing Facility  Patient/Family's Response to care:  Patient's daughter-in-law and son recognize need  for SNF for drain care before returning home and are agreeable to a SNF in [place]. Patient reported preference for [facility].  Patient/Family's Understanding of and Emotional Response to Diagnosis, Current Treatment, and Prognosis:  No questions/concerns. Emotional Assessment Appearance:  Appears stated age Attitude/Demeanor/Rapport:  Unable to Assess (Patient disoriented) Affect (typically observed):  Unable to Assess Orientation:  Oriented to Self Alcohol / Substance use:  Not Applicable Psych involvement (Current and /or in the community):  No (Comment)  Discharge Needs  Concerns to be addressed:  Care Coordination Readmission within the last 30 days:  No Current discharge risk:  None Barriers to Discharge:  No Barriers Identified   Mearl Latin, LCSWA 12/19/2015, 2:25 PM

## 2015-12-19 NOTE — Clinical Social Work Placement (Signed)
   CLINICAL SOCIAL WORK PLACEMENT  NOTE  Date:  12/19/2015  Patient Details  Name: Howard Rhodes MRN: [id number] Date of Birth: [birth year]  Clinical Social Work is seeking post-discharge placement for this patient at the Skilled  Nursing Facility level of care (*CSW will initial, date and re-position this form in  chart as items are completed):  Yes   Patient/family provided with [facility] Social Work Department's list of facilities offering this level of care within the geographic area requested by the patient (or if unable, by the patient's family).  Yes   Patient/family informed of their freedom to choose among providers that offer the needed level of care, that participate in Medicare, Medicaid or managed care program needed by the patient, have an available bed and are willing to accept the patient.  Yes   Patient/family informed of 's ownership interest in [facility] and [facility], as well as of the fact that they are under no obligation to receive care at these facilities.  PASRR submitted to EDS on       PASRR number received on       Existing PASRR number confirmed on 12/19/15     FL2 transmitted to all facilities in geographic area requested by pt/family on 12/19/15     FL2 transmitted to all facilities within larger geographic area on       Patient informed that his/her managed care company has contracts with or will negotiate with certain facilities, including the following:        Yes   Patient/family informed of bed offers received.  Patient chooses bed at [facility]     Physician recommends and patient chooses bed at      Patient to be transferred to [facility] on 12/19/15.  Patient to be transferred to facility by [facility]     Patient family notified on 12/19/15 of transfer.  Name of family member notified:  Gwen     PHYSICIAN       Additional Comment:     _______________________________________________ Mearl Latin, LCSWA 12/19/2015, 3:54 PM

## 2015-12-19 NOTE — Progress Notes (Signed)
Patient will DC to: [facility] Anticipated DC date: 12/19/15 Family notified: Dedra Skeens Transport by: Sharin Mons   CSW signing off.  Cristobal Goldmann, [place] Clinical Social Worker [phone]

## 2015-12-19 NOTE — Progress Notes (Signed)
Speech Language Pathology  Patient Details Name: Howard Rhodes MRN: [id number] DOB: 1930-12-04 Today's Date: 12/19/2015 Time:  -    MBS completed. Full report to be documented. Recommend pt continue Dys 3 texture and nectar thick liquids in hospital and at home when discharged.    Breck Coons [place].Ed [employer] [phone]

## 2015-12-19 NOTE — [place] FL2 (Signed)
[place] MEDICAID FL2 LEVEL OF CARE SCREENING TOOL     IDENTIFICATION  Patient Name: Howard Rhodes Birthdate: January 12, 1931 Sex: male Admission Date (Current Location): 12/07/2015  [facility] and [place] Number:  Producer, television/film/video and Address:  [facility] [place], [address] [place], [place] [address]      Provider Number: [id number]  Attending Physician Name and Address:  Albertine Grates, MD  Relative Name and Phone Number:  Elpidio Anis, [phone]    Current Level of Care: Hospital Recommended Level of Care: Skilled Nursing Facility Prior Approval Number:    Date Approved/Denied:   PASRR Number: [id number]  Discharge Plan: SNF    Current Diagnoses: Patient Active Problem List   Diagnosis Date Noted  . Pressure ulcer 12/16/2015  . 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 Hypokalemia 12/08/2015  . Abnormal LFTs   . Acute encephalopathy 04/12/2015  . Gram-negative bacteremia (HCC) 04/12/2015  . Lactic acidosis   . Disorientation   . Hyperglycemia   . Hypovolemia   . Septic shock (HCC) 04/09/2015  . CAP (community acquired pneumonia) 04/13/2013  . HLD (hyperlipidemia) 03/08/2013  . Intertrochanteric fracture of left hip (HCC) 03/08/2013  . Leukocytosis, unspecified 03/08/2013  . Lung nodule seen on imaging study 03/08/2013    Orientation RESPIRATION BLADDER Height & Weight     Self   (Nasal cannula 2L/min) Incontinent, External catheter (External urinary catheter) Weight: 192 lb 4.8 oz (87.227 kg) Height:     BEHAVIORAL SYMPTOMS/MOOD NEUROLOGICAL BOWEL NUTRITION STATUS   (N/A)   Incontinent (Biliary Tube)  (Please see DC summary)  AMBULATORY STATUS COMMUNICATION OF NEEDS Skin   Extensive Assist Verbally PU Stage and Appropriate Care, Surgical wounds (Stage I PU on sacrum; Closed incision on abdomen and hip;)                       Personal Care Assistance Level of Assistance  Bathing, Feeding, Dressing Bathing Assistance: Maximum  assistance Feeding assistance: Maximum assistance Dressing Assistance: Maximum assistance     Functional Limitations Info             SPECIAL CARE FACTORS FREQUENCY                       Contractures      Additional Factors Info  Code Status, Allergies Code Status Info: DNR Allergies Info: NKA           Current Medications (12/19/2015):  This is the current hospital active medication list Current Facility-Administered Medications  Medication Dose Route Frequency Provider Last Rate Last Dose  . antiseptic oral rinse (CPC / CETYLPYRIDINIUM CHLORIDE 0.05%) solution 7 mL  7 mL Mouth Rinse q12n4p Hollice Espy, MD   7 mL at 12/18/15 1731  . chlorhexidine (PERIDEX) 0.12 % solution 15 mL  15 mL Mouth Rinse BID Hollice Espy, MD   15 mL at 12/19/15 0954  . ciprofloxacin (CIPRO) IVPB 400 mg  400 mg Intravenous Q12H Belkys A Regalado, MD   400 mg at 12/19/15 0831  . enoxaparin (LOVENOX) injection 40 mg  40 mg Subcutaneous Q24H Marquita Palms, RPH   40 mg at 12/19/15 0831  . feeding supplement (ENSURE ENLIVE) (ENSURE ENLIVE) liquid 237 mL  237 mL Oral BID BM Belkys A Regalado, MD   237 mL at 12/19/15 0954  . lactulose (CHRONULAC) 10 GM/15ML solution 20 g  20 g Oral BID Belkys A Regalado, MD   20 g  at 12/19/15 0954  . levalbuterol (XOPENEX) nebulizer solution 1.25 mg  1.25 mg Nebulization Q6H PRN Lorretta Harp, MD         Discharge Medications: Please see discharge summary for a list of discharge medications.  Relevant Imaging Results:  Relevant Lab Results:   Additional Information SSN: [id number] [place], [place]

## 2015-12-19 NOTE — Progress Notes (Signed)
MBSS complete. Full report located under chart review in imaging section. Rec: continue DYs 3, nectar      Breck Coons [place].Ed [employer] [phone]

## 2015-12-19 NOTE — Progress Notes (Signed)
Patient ID: Howard Rhodes, male   DOB: 1931/09/04, 82 y.o.   MRN: [id number]    Referring Physician(s): Dr. Ginette Pitman, [place]  Chief Complaint: Liver abscess and cholecystitis  Subjective: Pt with no new complaints  Allergies: Review of patient's allergies indicates no known allergies.  Medications: Prior to Admission medications   Medication Sig Start Date End Date Taking? Authorizing Provider  atorvastatin (LIPITOR) 40 MG tablet Take 40 mg by mouth daily at 6 PM.   Yes Historical Provider, MD  furosemide (LASIX) 20 MG tablet Take 1 tablet (20 mg total) by mouth daily. 04/18/15  Yes Dorothea Ogle, MD  cefUROXime (CEFTIN) 500 MG tablet Take 1 tablet (500 mg total) by mouth 2 (two) times daily with a meal. Patient not taking: Reported on 12/07/2015 04/18/15   Dorothea Ogle, MD  HYDROcodone-acetaminophen (NORCO/VICODIN) 5-325 MG per tablet Take 1 tablet by mouth every 6 (six) hours as needed for pain. Patient not taking: Reported on 12/07/2015 04/16/13   Vassie Loll, MD  levalbuterol [facility]) 0.63 MG/3ML nebulizer solution Take 3 mLs (0.63 mg total) by nebulization every 6 (six) hours as needed for wheezing or shortness of breath. Patient not taking: Reported on 12/07/2015 04/18/15   Dorothea Ogle, MD    Vital Signs: BP 111/50 mmHg  Pulse 86  Temp(Src) 98.3 F (36.8 C) (Oral)  Resp 20  Wt 192 lb 4.8 oz (87.227 kg)  SpO2 97%  Physical Exam: Abd: soft, NT, perc chole drain with bilious drainage.  Liver abscess drain with minimal serous drainage present today (20cc/24hrs)  Both sites are c/d/i  Imaging: Ir Perc Cholecystostomy  12/15/2015  INDICATION: 82 year old male with a history of acute cholecystitis and liver abscess. He has had liver abscess drained with percutaneous 10 French drain 12/13/2015. Nuclear medicine HIDA study confirms acute calculus cholecystitis. He has been referred for percutaneous cholecystostomy tube EXAM: CHOLECYSTOSTOMY MEDICATIONS: The patient is currently  admitted to the hospital and receiving intravenous antibiotics. The antibiotics were administered within an appropriate time frame prior to the initiation of the procedure. ANESTHESIA/SEDATION: Fentanyl 25 mcg IV; Versed 0.5 mg IV Moderate Sedation Time:  13 The patient was continuously monitored during the procedure by the interventional radiology nurse under my direct supervision. COMPLICATIONS: None PROCEDURE: Informed written consent was obtained from the patient and the patient's family after a thorough discussion of the procedural risks, benefits and alternatives. All questions were addressed. Maximal Sterile Barrier Technique was utilized including caps, mask, sterile gowns, sterile gloves, sterile drape, hand hygiene and skin antiseptic. A timeout was performed prior to the initiation of the procedure. Ultrasound survey of the right upper quadrant was performed for planning purposes. Once the patient is prepped and draped in the usual sterile fashion, the skin and subcutaneous tissues overlying the gallbladder were generously infiltrated 1% lidocaine for local anesthesia. A coaxial needle was advanced under ultrasound guidance through the skin subcutaneous tissues and a small segment of liver into the gallbladder lumen. With removal of the stylet, spontaneous dark bile drainage occurred. Using modified Seldinger technique, a 10 French drain was placed into the gallbladder fossa, with aspiration of the sample for the lab. Contrast injection confirmed position of the tube within the gallbladder lumen. Drainage catheter was attached to gravity drain with a suture retention placed. Patient tolerated the procedure well and remained hemodynamically stable throughout. No complications were encountered and no significant blood loss encountered. IMPRESSION: Status post image guided percutaneous cholecystostomy tube. Sample was sent to the lab for  analysis. Signed, Yvone Neu. Loreta Ave, DO [facility] Radiology Electronically Signed   By: Gilmer Mor D.O.   On: 12/15/2015 16:55    Labs:  CBC:  Recent Labs  12/15/15 0546 12/16/15 0421 12/18/15 0345 12/19/15 0240  WBC 11.9* 13.3* 9.6 10.4  HGB 13.2 13.2 12.8* 13.3  HCT 38.7* 39.3 37.5* 38.2*  PLT 329 407* 438* 513*    COAGS:  Recent Labs  04/09/15 2133 12/08/15 0408 12/15/15 0849  INR 1.35 1.34 1.25  APTT 29 37 37    BMP:  Recent Labs  12/15/15 0546 12/16/15 0421 12/18/15 0345 12/19/15 0240  NA 133* 137 139 138  K 3.8 3.4* 3.6 4.2  CL 105 106 104 103  CO2 21* 21* 26 24  GLUCOSE 112* 133* 101* 113*  BUN 6 5* <5* 6  CALCIUM 7.6* 7.7* 7.7* 8.1*  CREATININE 0.48* 0.50* 0.53* 0.54*  GFRNONAA >60 >60 >60 >60  GFRAA >60 >60 >60 >60    LIVER FUNCTION TESTS:  Recent Labs  12/12/15 0117 12/14/15 0214 12/16/15 0421 12/18/15 0345  BILITOT 2.6* 2.7* 1.5* 0.8  AST 58* 45* 38 34  ALT 52 44 40 32  ALKPHOS 90 80 105 98  PROT 5.1* 4.9* 5.1* 5.2*  ALBUMIN 1.8* 1.6* 1.8* 1.8*    Assessment and Plan: S/p Liver abscess drain-intact Output slowing -plan for DC home today.  Will arrange in drain clinic for repeat CT in 1 week  S/p perc chole drain intact Output good--- bile Will be in place 6-8 weeks  Plans per [facility]   Electronically Signed: Aliyana Dlugosz E 12/19/2015, 9:33 AM   I spent a total of 15 Minutes at the the patient's bedside AND on the patient's hospital floor or unit, greater than 50% of which was counseling/coordinating care for liver abscess and cholecystitis

## 2015-12-19 NOTE — Care Management Important Message (Signed)
Important Message  Patient Details  Name: Howard Rhodes MRN: [id number] Date of Birth: 01/19/1931   Medicare Important Message Given:  Yes    Kyla Balzarine 12/19/2015, 11:55 AM

## 2015-12-19 NOTE — Progress Notes (Signed)
Patient transported by EMS to [facility]. PIV taken out, on room air. Report called to facility. Patient belongings including dentures sent with EMS.

## 2015-12-19 NOTE — Discharge Summary (Addendum)
Discharge Summary  [name]:[id number] DOB: 03/31/1931  PCP: Ailene Ravel, MD  Admit date: 12/07/2015 Discharge date: 12/19/2015  Time spent: >77mins  Recommendations for Outpatient Follow-up:  (preferred caregiver contact [name], patient's wife has dementia) 1. F/u with PMD within a week, per HPOA Gwen Taylor [phone]), patient is bedbound and total care, pmd does home visit before hospitalization. 2. F/u with [facility] Dr wyatt regarding perc chole tube 3. F/u with interventional radiology Dr Bonnielee Haff for liver abscess drain and repeat CT ab   Discharge Diagnoses:  Active Hospital Problems   Diagnosis Date Noted  . Septic shock (HCC) 04/09/2015  . Pressure ulcer 12/16/2015  . Hypokalemia 12/08/2015  . Acute encephalopathy 04/12/2015  . Gram-negative bacteremia (HCC) 04/12/2015  . CAP (community acquired pneumonia) 04/13/2013  . HLD (hyperlipidemia) 03/08/2013    Resolved Hospital Problems   Diagnosis Date Noted Date Resolved  No resolved problems to display.    Discharge Condition: stable  Diet recommendation: diet modification per speech recommendations; Dysphagia 3 diet, nectar thick liquids, meds whole in puree  Filed Weights   12/12/15 1305 12/19/15 0524  Weight: 86.637 kg (191 lb) 87.227 kg (192 lb 4.8 oz)    History of present illness:  Howard Rhodes is a 82 y.o. male with PMH of hyperlipidemia, depression, stroke (not moving both legs at baseline), lung nodules, who presents with altered mental status and shortness of breath.  Per patient's wife, patient has been confused and disoriented in the past 2 days. He has some mild shortness of breath, but not coughing. No fever or chills. He sweats a lot. Patient did not complain of abdominal pain, diarrhea symptoms of UTI. He does not move his legs which is normal to him.   In ED, patient was found to have WBC 27.9, lactate 6.24 negative urinalysis, temperature 101.7, tachycardia, tachypnea, hypotension with  blood pressure 86/55, which improved to 107/61 with IV fluid, potassium 3.4, renal function okay. Chest x-ray showed multiple lobar pneumonia.,   Hospital Course:  Principal Problem:   Septic shock (HCC) Active Problems:   HLD (hyperlipidemia)   CAP (community acquired pneumonia)   Acute encephalopathy   Gram-negative bacteremia (HCC)   Hypokalemia   Pressure ulcer  Liver abscess, Gram-negative Septic shock (HCC), klebsiella, enteobacter Bacteremia (+blood culture on 2/12) Patient meets criteria for septic shock on admission given hypotension persisting despite fluid support and briefly requiring pressor support, lactic acidosis, marked leukocytosis, tachypnea and tachycardia with pulmonary source noted on chest x-ray. RUQ [place] complex cyst in the liver, adjacent to gall bladder. CT consistent with 2 liver abscess. CT pelvis negative for diverticulitis. -S/P percutaneous drainage of liver abscess 2-18.  -HIDA scan positive for cholecystitis. Surgery consulted, recommendation for cholecystostomy tube.  - S/P Cholecystostomy drain by Loreta Ave 12/15/2015 -Patient was initially treated with vanc/zosyn from admission, then abx Changed to cipro, both organism from blood culture and abscess culture sensitive to cipro.  -Blood culture drawn 2-17. 1/2 with staph, vanc was restarted, on 2/21,then d/ced on 2/23 due to culture result with staph coag negative , likely contamination, repeat blood culture on 2/22, no growth.  -improving , case discussed with infectious disease who recommended patient to be discharged on oral cipro and to finish at least total of 3weeks of abx treatment depends on repeat ct ab and timing if drain removal    Multilobar, broncho-PNA Continue with cipro. Weaned off oxygen, currently on room air Swallow eval rec:  Dysphagia 3 diet, nectar thick liquids, meds  whole in puree  Encephalopathy; ammonia level elevated initially, normalized with lactulose  Suspect related  to acute illness , liver abscess.  Improving , patient more alert and verbal. Now likely baseline  Hyperbilirubinemia: secondary to liver abscess; Elevated ammonia level. US showed complex cyst liver. CT abdomen showed liver abscess.  Bili trending down. Ammonia trending down. Repeat in am    Stroke (cerebrum) [facility]): At baseline, bed bound and total care for the last two years  Hypokalemia:Replacing. Mg normal   Code Status: DNR  Family Communication: POA [name] updated 2-20 and 2/24  Disposition Plan: home with home health per family request initially, later family agreed to snf due to patient's wife who is the main care giver has dementia  Consultants:  Interventional radiology  GI  Critical care  General surgery  Infectious disease phone conversation with Dr Cliffton Asters who advised patient to be discharged on oral cipro and to finish at least total of 3weeks of abx treatment depends on repeat ct ab and timing if drain removal  Procedures: S/P perc drain for liver abscess by [facility] 12/13/2015, need to follow up with Dr Bonnielee Haff for liver drain S/P Cholecystostomy drain by Loreta Ave 12/15/2015, need to follow up with [facility] Dr Lindie Spruce regarding perc chole tube  Antibiotics:  IV Zosyn and vancomycin from admission to 2/15  vanc 2/21-2/23  cipro from 2/15   Discharge Exam: BP 105/58 mmHg  Pulse 96  Temp(Src) 98.5 F (36.9 C) (Oral)  Resp 18  Wt 87.227 kg (192 lb 4.8 oz)  SpO2 97%  General: alert and verbal, seems at baseline Cardiovascular: RRR Respiratory: CTABL Abdomen:Soft, obese, nontender, hypoactive bowel sounds , 2 drain in place with decreased drainage now serous with purulent stranding Musculoskeletal: Trace pitting edema bilaterally , chronic bilateral foot drop  Discharge Instructions You were cared for by a hospitalist during your hospital stay. If you have any questions about your discharge medications or the care you received while you were in the  hospital after you are discharged, you can call the unit and asked to speak with the hospitalist on call if the hospitalist that took care of you is not available. Once you are discharged, your primary care physician will handle any further medical issues. Please note that NO REFILLS for any discharge medications will be authorized once you are discharged, as it is imperative that you return to your primary care physician (or establish a relationship with a primary care physician if you do not have one) for your aftercare needs so that they can reassess your need for medications and monitor your lab values.      Discharge Instructions    Face-to-face encounter (required for Medicare/Medicaid patients)    Complete by:  As directed   I Kayna Suppa certify that this patient is under my care and that I, or a nurse practitioner or physician's assistant working with me, had a face-to-face encounter that meets the physician face-to-face encounter requirements with this patient on 12/18/2015. The encounter with the patient was in whole, or in part for the following medical condition(s) which is the primary reason for home health care (List medical condition): FTt , total care  The encounter with the patient was in whole, or in part, for the following medical condition, which is the primary reason for home health care:  FTt, total care  I certify that, based on my findings, the following services are medically necessary home health services:  Nursing  Reason for Medically Necessary Home Health  Services:  Skilled Nursing- Change/Decline in Patient Status  My clinical findings support the need for the above services:  Bedbound  Further, I certify that my clinical findings support that this patient is homebound due to:  Mental confusion     Home Health    Complete by:  As directed   To provide the following care/treatments:   RN Home Health Aide Social work              Medication List    STOP taking these  medications        cefUROXime 500 MG tablet  Commonly known as:  CEFTIN     HYDROcodone-acetaminophen 5-325 MG tablet  Commonly known as:  NORCO/VICODIN     levalbuterol 0.63 MG/3ML nebulizer solution  Commonly known as:  XOPENEX      TAKE these medications        atorvastatin 40 MG tablet  Commonly known as:  LIPITOR  Take 40 mg by mouth daily at 6 PM.     ciprofloxacin 500 MG tablet  Commonly known as:  CIPRO  Take 1 tablet (500 mg total) by mouth 2 (two) times daily.     feeding supplement (ENSURE ENLIVE) Liqd  Take 237 mLs by mouth 2 (two) times daily between meals.     furosemide 20 MG tablet  Commonly known as:  LASIX  Take 1 tablet (20 mg total) by mouth daily.     lactulose 10 GM/15ML solution  Commonly known as:  CHRONULAC  Take 30 mLs (20 g total) by mouth daily.       No Known Allergies Follow-up Information    Follow up with Jimmye Norman, MD. Schedule an appointment as soon as possible for a visit in 5 weeks.   Specialty:  General Surgery   Why:  follow-up cholecystostomy drain   Contact information:   [address] [place] [address] [phone]       Follow up with HOSS, ART A, MD.   Specialty:  Interventional Radiology   Why:  our office will call you with an appointment time   Contact information:   [address] [place] [address] [phone]       Follow up with [facility] L, MD In 1 week.   Specialty:  Family Medicine   Why:  hosptial discharge follow up   Contact information:   [address] [place] [address] [phone]        The results of significant diagnostics from this hospitalization (including imaging, microbiology, ancillary and laboratory) are listed below for reference.    Significant Diagnostic Studies: Ct Pelvis Wo Contrast  12/12/2015  CLINICAL DATA:  82 year old male with hepatic abscess seen on earlier study. Delayed images through the pelvis performed to evaluate  for possible diverticulitis. EXAM: CT PELVIS WITHOUT CONTRAST TECHNIQUE: Multidetector CT imaging of the pelvis was performed following the standard protocol without intravenous contrast. COMPARISON:  Earlier CT dated 12/12/2015 FINDINGS: No free air or free fluid identified within the pelvis. There is mild thickening and trabecular appearance of the bladder the, likely related to a degree of chronic bladder outlet obstruction. The prostate gland is grossly unremarkable. There are scattered sigmoid diverticula without active inflammatory changes. Normal appendix. No dilated bowel loops identified within the pelvis. There is aortoiliac atherosclerotic disease. There is a 3 cm aneurysmal dilatation of the distal aorta. No adenopathy identified within the pelvis. Small fat containing umbilical hernia. There is osteopenia with  extensive degenerative changes of the spine. Left hip intra medullary orthopedic hardware and ectopic ossification. No acute fracture. IMPRESSION: Sigmoid diverticulosis. A 3 cm distal abdominal aortic aneurysm. Recommend followup by ultrasound in 3 years. This recommendation follows ACR consensus guidelines: White Paper of the ACR Incidental Findings Committee II on Vascular Findings. J Am Coll Radiol 2013; 16:109-604 Electronically Signed   By: Elgie Collard M.D.   On: 12/12/2015 21:05   Ct Angio Abdomen W/cm &/or Wo Contrast  12/12/2015  CLINICAL DATA:  Evaluate liver cyst on ultrasound EXAM: CT ANGIOGRAPHY ABDOMEN TECHNIQUE: Multidetector CT imaging of the abdomen was performed using the standard protocol during bolus administration of intravenous contrast. Multiplanar reconstructed images including MIPs were obtained and reviewed to evaluate the vascular anatomy. CONTRAST:  OMNIPAQUE IOHEXOL 300 MG/ML  SOLN COMPARISON:  None. FINDINGS: Fusiform ectasia of the infrarenal abdominal aorta, measuring 2.9 x 2.5 cm (series 3/image 89). No evidence of abdominal aortic aneurysm.  Atherosclerotic calcifications of the abdominal aorta. Lower chest: Trace bilateral pleural effusions with associated mild dependent atelectasis in the bilateral lower lobes. Hepatobiliary: 5.2 x 6.1 x 4.7 cm complex cystic lesion in the anterior right hepatic dome (series 7/ image 36). Additional 4.7 x 6.2 x 6.2 cm complex cystic lesion inferiorly in the right hepatic lobe (series 7/image 50). This appearance is worrisome for multifocal hepatic abscesses. Additional scattered smaller cystic lesions measuring 11 mm in the medial segment left hepatic lobe (series 7/ image 33) and 13 mm posteriorly in the medial segment left hepatic lobe (series 7/ image 29) are indeterminate. Gallbladder is mildly thick-walled. Subtle discontinuity along the posterior gallbladder wall is not excluded (series 3/ image 45), possibly reflecting the etiology of the hepatic abscesses, although the relative lack gallbladder inflammation is unusual given the size of the liver involvement. No intrahepatic or extrahepatic ductal dilatation. Pancreas: Within normal limits. Spleen: Within normal limits. Adrenals/Urinary Tract: Adrenal glands are within normal limits. Kidneys are within normal limits.  No hydronephrosis. Stomach/Bowel: Stomach is within normal limits. Visualized bowel is unremarkable, noting colonic diverticulosis. Vascular/Lymphatic: Vascular findings as above. No suspicious abdominal lymphadenopathy. 14 mm short axis portacaval node (series 3/image 56), within the upper limits of normal. Other: No abdominal ascites. Musculoskeletal: Degenerative changes of the visualized thoracolumbar spine. Mild anterior wedging at T8. Review of the MIP images confirms the above findings. IMPRESSION: Two suspected hepatic abscesses measuring up to 6.2 cm in the right hepatic lobe, as above. Possible discontinuity along the posterior gallbladder wall, equivocal. While this may be the source of the hepatic abscesses, the relative lack of  gallbladder inflammation is unusual. Consider CT pelvis to evaluate for additional etiologies including diverticulitis. These results were called by telephone at the time of interpretation on 12/12/2015 at 5:20 pm to Dr. Hartley Barefoot , who verbally acknowledged these results. Electronically Signed   By: Charline Bills M.D.   On: 12/12/2015 17:23   Nm Hepatobiliary Liver Func  12/14/2015  CLINICAL DATA:  History of hepatic abscess. Question of subacute cholecystitis associated with abscess. EXAM: NUCLEAR MEDICINE HEPATOBILIARY IMAGING TECHNIQUE: Sequential images of the abdomen were obtained out to 60 minutes following intravenous administration of radiopharmaceutical. RADIOPHARMACEUTICALS:  5.4 mCi Tc-61m  Choletec IV COMPARISON:  Ultrasound 12/10/2015 FINDINGS: Initial imaging is performed following administration Choletec. After 45 minutes, of gallbladder is not visualized. Morphine was administered. Additional imaging is performed, failing to reveal gallbladder activity. IMPRESSION: Findings are consistent with acute cholecystitis. Electronically Signed   By: Norva Pavlov M.D.  On: 12/14/2015 12:41   Ir Guided Horace Porteous W Catheter Placement  12/13/2015  INDICATION: Liver abscess EXAM: ABSCESS DRAINAGE; IR ULTRASOUND GUIDANCE MEDICATIONS: The patient is currently admitted to the hospital and receiving intravenous antibiotics. The antibiotics were administered within an appropriate time frame prior to the initiation of the procedure. ANESTHESIA/SEDATION: Fentanyl 1 mcg IV; Versed 50 mg IV Moderate Sedation Time:  13 The patient was continuously monitored during the procedure by the interventional radiology nurse under my direct supervision. COMPLICATIONS: None immediate. PROCEDURE: Informed written consent was obtained from the patient after a thorough discussion of the procedural risks, benefits and alternatives. All questions were addressed. Maximal Sterile Barrier Technique was utilized including  caps, mask, sterile gowns, sterile gloves, sterile drape, hand hygiene and skin antiseptic. A timeout was performed prior to the initiation of the procedure. Under sonographic guidance, a 21 gauge needle was inserted into the liver abscess via inter costal approach. It was removed over a 018 wire which was up sized to a 3 J. a 10 [place] dilator followed by a 10 [place] drain were inserted into the abscess. It was looped and string fixed. It was sewn to the skin. Contrast was injected. 20 cc of pus was aspirated. FINDINGS: Imaging documents placement of a 10 French drain in a liver abscess. IMPRESSION: Successful 10 French hepatic abscess strain yielding pus. Electronically Signed   By: Jolaine Click M.D.   On: 12/13/2015 13:17   Ir Perc Cholecystostomy  12/15/2015  INDICATION: 82 year old male with a history of acute cholecystitis and liver abscess. He has had liver abscess drained with percutaneous 10 French drain 12/13/2015. Nuclear medicine HIDA study confirms acute calculus cholecystitis. He has been referred for percutaneous cholecystostomy tube EXAM: CHOLECYSTOSTOMY MEDICATIONS: The patient is currently admitted to the hospital and receiving intravenous antibiotics. The antibiotics were administered within an appropriate time frame prior to the initiation of the procedure. ANESTHESIA/SEDATION: Fentanyl 25 mcg IV; Versed 0.5 mg IV Moderate Sedation Time:  13 The patient was continuously monitored during the procedure by the interventional radiology nurse under my direct supervision. COMPLICATIONS: None PROCEDURE: Informed written consent was obtained from the patient and the patient's family after a thorough discussion of the procedural risks, benefits and alternatives. All questions were addressed. Maximal Sterile Barrier Technique was utilized including caps, mask, sterile gowns, sterile gloves, sterile drape, hand hygiene and skin antiseptic. A timeout was performed prior to the initiation of the procedure.  Ultrasound survey of the right upper quadrant was performed for planning purposes. Once the patient is prepped and draped in the usual sterile fashion, the skin and subcutaneous tissues overlying the gallbladder were generously infiltrated 1% lidocaine for local anesthesia. A coaxial needle was advanced under ultrasound guidance through the skin subcutaneous tissues and a small segment of liver into the gallbladder lumen. With removal of the stylet, spontaneous dark bile drainage occurred. Using modified Seldinger technique, a 10 French drain was placed into the gallbladder fossa, with aspiration of the sample for the lab. Contrast injection confirmed position of the tube within the gallbladder lumen. Drainage catheter was attached to gravity drain with a suture retention placed. Patient tolerated the procedure well and remained hemodynamically stable throughout. No complications were encountered and no significant blood loss encountered. IMPRESSION: Status post image guided percutaneous cholecystostomy tube. Sample was sent to the lab for analysis. Signed, Yvone Neu. Loreta Ave, DO [facility] Electronically Signed   By: Gilmer Mor D.O.   On: 12/15/2015 16:55  Ir US Guide Bx Asp/drain  12/13/2015  INDICATION: Liver abscess EXAM: ABSCESS DRAINAGE; IR ULTRASOUND GUIDANCE MEDICATIONS: The patient is currently admitted to the hospital and receiving intravenous antibiotics. The antibiotics were administered within an appropriate time frame prior to the initiation of the procedure. ANESTHESIA/SEDATION: Fentanyl 1 mcg IV; Versed 50 mg IV Moderate Sedation Time:  13 The patient was continuously monitored during the procedure by the interventional radiology nurse under my direct supervision. COMPLICATIONS: None immediate. PROCEDURE: Informed written consent was obtained from the patient after a thorough discussion of the procedural risks, benefits and alternatives.  All questions were addressed. Maximal Sterile Barrier Technique was utilized including caps, mask, sterile gowns, sterile gloves, sterile drape, hand hygiene and skin antiseptic. A timeout was performed prior to the initiation of the procedure. Under sonographic guidance, a 21 gauge needle was inserted into the liver abscess via inter costal approach. It was removed over a 018 wire which was up sized to a 3 J. a 10 [place] dilator followed by a 10 [place] drain were inserted into the abscess. It was looped and string fixed. It was sewn to the skin. Contrast was injected. 20 cc of pus was aspirated. FINDINGS: Imaging documents placement of a 10 French drain in a liver abscess. IMPRESSION: Successful 10 French hepatic abscess strain yielding pus. Electronically Signed   By: Jolaine Click M.D.   On: 12/13/2015 13:17   Dg Chest Port 1 View  12/07/2015  CLINICAL DATA:  82 year old presenting with 2 day history of fever and acute mental status changes. Patient was hypoxic upon EMS arrival. EXAM: PORTABLE CHEST 1 VIEW COMPARISON:  CT chest 04/15/2015 and earlier. Chest x-rays 04/14/2015 and earlier. FINDINGS: Markedly suboptimal inspiration. Streaky and patchy airspace opacity in the lung bases and in the right upper lobe, more than expected with just atelectasis due to low volumes. Cardiac silhouette mildly to moderately enlarged for technique and degree of inspiration, unchanged. Mild pulmonary venous hypertension without overt edema. IMPRESSION: Markedly suboptimal inspiration. Multilobar bronchopneumonia suspected involving both lung bases and the right upper lobe. Electronically Signed   By: Hulan Saas M.D.   On: 12/07/2015 21:33   Dg Swallowing Func-speech Pathology  12/19/2015  Objective Swallowing Evaluation: Type of Study: MBS-Modified Barium Swallow Study Patient Details Name: Alija Riano MRN: [id number] Date of Birth: May 02, 1931 Today's Date: 12/19/2015 Time: SLP Start Time (ACUTE ONLY): 1137-SLP Stop Time  (ACUTE ONLY): 1156 SLP Time Calculation (min) (ACUTE ONLY): 19 min Past Medical History: Past Medical History Diagnosis Date . HLD (hyperlipidemia)  . Arthritis  . Anemia  . Depression  . Hip fracture (HCC)  . Stroke [facility])  Past Surgical History: Past Surgical History Procedure Laterality Date . Hernia repair   . Femur im nail Left 03/09/2013   Procedure: INTRAMEDULLARY (IM) NAIL FEMORAL;  Surgeon: Verlee Rossetti, MD;  Location: [facility] ORS;  Service: Orthopedics;  Laterality: Left; HPI: 82 year old male with past medical history of depression, stroke which has left him paraplegic who was brought in by wife on night of 2/12 after he had been confused and disoriented for several days as well as noted to be diaphoretic.  Dx sepsis secondary to CAP, acute encephalopathy. Followed by SLP services June 2016 admission - concerns for choking incidents with dry solids; he was D/Cd on a dysphagia 3 diet with thin liquids. Repeat MBS prior to discharge for possible liquid upgrade.   Subjective: alert, looks at examiner but responds intermittently Assessment / Plan / Recommendation CHL IP CLINICAL IMPRESSIONS 12/19/2015  Therapy Diagnosis Moderate pharyngeal phase dysphagia;Mild pharyngeal phase dysphagia Clinical Impression Pt exhibited mild-mod pharyngeal phase dysphagia characterized sensorimotor deficits. Delayed swallow initiation to the vallecula observed. Noted silent penetration, however bolus ejected out only with nectar consistency. No significant pharyngeal residue. Mastication WFL. Dementia increases aspiration risk and he will continue to require full assist/supervision with meals. Pt educated re: diet recommendation of Dysphagia 3 (due to dementia), nectar thick liquids (straws allowed), meds whole in puree and full assist/supervision during PO intake. SLP will f/u for toleration of diet recommended and attempt to educate pt's POA re: thickener. Impact on safety and function (No Data)   CHL IP TREATMENT RECOMMENDATION  12/19/2015 Treatment Recommendations Therapy as outlined in treatment plan below   Prognosis 12/19/2015 Prognosis for Safe Diet Advancement Good Barriers to Reach Goals Cognitive deficits;Severity of deficits Barriers/Prognosis Comment -- CHL IP DIET RECOMMENDATION 12/19/2015 SLP Diet Recommendations Dysphagia 3 (Mech soft) solids;Nectar thick liquid Liquid Administration via Cup;No straw Medication Administration Whole meds with puree Compensations Minimize environmental distractions;Slow rate;Small sips/bites Postural Changes Seated upright at 90 degrees   CHL IP OTHER RECOMMENDATIONS 12/19/2015 Recommended Consults -- Oral Care Recommendations Oral care BID Other Recommendations Order thickener from pharmacy   CHL IP FOLLOW UP RECOMMENDATIONS 12/19/2015 Follow up Recommendations 24 hour supervision/assistance   CHL IP FREQUENCY AND DURATION 12/19/2015 Speech Therapy Frequency (ACUTE ONLY) min 2x/week Treatment Duration 2 weeks      CHL IP ORAL PHASE 12/19/2015 Oral Phase WFL Oral - Pudding Teaspoon -- Oral - Pudding Cup -- Oral - Honey Teaspoon -- Oral - Honey Cup -- Oral - Nectar Teaspoon -- Oral - Nectar Cup -- Oral - Nectar Straw -- Oral - Thin Teaspoon -- Oral - Thin Cup -- Oral - Thin Straw -- Oral - Puree -- Oral - Mech Soft -- Oral - Regular -- Oral - Multi-Consistency -- Oral - Pill -- Oral Phase - Comment --  CHL IP PHARYNGEAL PHASE 12/19/2015 Pharyngeal Phase Impaired Pharyngeal- Pudding Teaspoon -- Pharyngeal -- Pharyngeal- Pudding Cup -- Pharyngeal -- Pharyngeal- Honey Teaspoon -- Pharyngeal -- Pharyngeal- Honey Cup -- Pharyngeal -- Pharyngeal- Nectar Teaspoon -- Pharyngeal -- Pharyngeal- Nectar Cup Penetration/Aspiration before swallow;Delayed swallow initiation-vallecula;Reduced laryngeal elevation;Reduced airway/laryngeal closure Pharyngeal Material enters airway, remains ABOVE vocal cords then ejected out Pharyngeal- Nectar Straw Delayed swallow initiation-vallecula;Reduced airway/laryngeal  closure;Reduced laryngeal elevation;Penetration/Aspiration before swallow Pharyngeal Material enters airway, remains ABOVE vocal cords then ejected out Pharyngeal- Thin Teaspoon -- Pharyngeal -- Pharyngeal- Thin Cup Delayed swallow initiation-vallecula;Penetration/Aspiration during swallow;Reduced airway/laryngeal closure;Reduced laryngeal elevation Pharyngeal Material enters airway, remains ABOVE vocal cords and not ejected out Pharyngeal- Thin Straw Delayed swallow initiation-vallecula;Penetration/Aspiration during swallow;Reduced airway/laryngeal closure;Reduced laryngeal elevation Pharyngeal Material enters airway, remains ABOVE vocal cords and not ejected out Pharyngeal- Puree NT Pharyngeal -- Pharyngeal- Mechanical Soft -- Pharyngeal -- Pharyngeal- Regular -- Pharyngeal -- Pharyngeal- Multi-consistency -- Pharyngeal -- Pharyngeal- Pill -- Pharyngeal -- Pharyngeal Comment --  CHL IP CERVICAL ESOPHAGEAL PHASE 12/19/2015 Cervical Esophageal Phase WFL Pudding Teaspoon -- Pudding Cup -- Honey Teaspoon -- Honey Cup -- Nectar Teaspoon -- Nectar Cup -- Nectar Straw -- Thin Teaspoon -- Thin Cup -- Thin Straw -- Puree -- Mechanical Soft -- Regular -- Multi-consistency -- Pill -- Cervical Esophageal Comment -- No flowsheet data found. Royce Macadamia 12/19/2015, 1:49 PM  Breck Coons Lonell Face.Ed CCC-SLP Pager [phone]             Dg Swallowing Func-speech Pathology  12/10/2015  Objective Swallowing Evaluation: Type of Study: MBS-Modified Barium Swallow Study  Patient Details Name: Jacobo Moncrief MRN: [id number] Date of Birth: 1930/11/26 Today's Date: 12/10/2015 Time: SLP Start Time (ACUTE ONLY): 0859-SLP Stop Time (ACUTE ONLY): 0919 SLP Time Calculation (min) (ACUTE ONLY): 20 min Past Medical History: Past Medical History Diagnosis Date . HLD (hyperlipidemia)  . Arthritis  . Anemia  . Depression  . Hip fracture (HCC)  . Stroke [facility])  Past Surgical History: Past Surgical History Procedure Laterality Date . Hernia repair    . Femur im nail Left 03/09/2013   Procedure: INTRAMEDULLARY (IM) NAIL FEMORAL;  Surgeon: Verlee Rossetti, MD;  Location: [facility]S;  Service: Orthopedics;  Laterality: Left; HPI: 82 year old male with past medical history of depression, stroke which has left him paraplegic who was brought in by wife on night of 2/12 after he had been confused and disoriented for several days as well as noted to be diaphoretic.  Dx sepsis secondary to CAP, acute encephalopathy. Followed by SLP services June 2016 admission - concerns for choking incidents with dry solids; he was D/Cd on a dysphagia 3 diet with thin liquids.  Subjective: alert, looks at examiner but responds intermittently Assessment / Plan / Recommendation CHL IP CLINICAL IMPRESSIONS 12/10/2015 Therapy Diagnosis Mild pharyngeal phase dysphagia;Moderate pharyngeal phase dysphagia Clinical Impression Pt exhibited mild-mod pharyngeal phase dysphagia, characterized by delayed swallow initiation to the pyriform sinuses. This resulted in aspiration before the swallow during intake of thin liquids via cup with 5 second delayed cough . During intake of all other consistencies, oropharyngeal swallow appeared within functional limits. No mastication impairment or difficulty with straws observed, however due to pt's cognitive deficits regular texture and straws are not recommended as pt unlikely to sense aspiration. Recommend Dysphagia 3 (mechanical soft) diet, nectar thick liquids (no straws), meds whole in puree and full assist/supervision during meals. Pt educated re: diet recommendation. SLP will f/u to determine diet toleration. Impact on safety and function Moderate aspiration risk;Severe aspiration risk   CHL IP TREATMENT RECOMMENDATION 12/10/2015 Treatment Recommendations Therapy as outlined in treatment plan below   Prognosis 12/10/2015 Prognosis for Safe Diet Advancement Fair Barriers to Reach Goals Cognitive deficits;Severity of deficits Barriers/Prognosis Comment -- CHL IP  DIET RECOMMENDATION 12/10/2015 SLP Diet Recommendations Dysphagia 3 (Mech soft) solids;Nectar thick liquid Liquid Administration via Cup;No straw Medication Administration Whole meds with puree Compensations Minimize environmental distractions;Slow rate;Small sips/bites Postural Changes Seated upright at 90 degrees   CHL IP OTHER RECOMMENDATIONS 12/10/2015 Recommended Consults -- Oral Care Recommendations Oral care BID Other Recommendations Order thickener from pharmacy   CHL IP FOLLOW UP RECOMMENDATIONS 12/10/2015 Follow up Recommendations (No Data)   CHL IP FREQUENCY AND DURATION 12/10/2015 Speech Therapy Frequency (ACUTE ONLY) min 2x/week Treatment Duration 2 weeks      CHL IP ORAL PHASE 12/10/2015 Oral Phase WFL Oral - Pudding Teaspoon -- Oral - Pudding Cup -- Oral - Honey Teaspoon -- Oral - Honey Cup -- Oral - Nectar Teaspoon -- Oral - Nectar Cup -- Oral - Nectar Straw -- Oral - Thin Teaspoon -- Oral - Thin Cup -- Oral - Thin Straw -- Oral - Puree -- Oral - Mech Soft -- Oral - Regular -- Oral - Multi-Consistency -- Oral - Pill -- Oral Phase - Comment --  CHL IP PHARYNGEAL PHASE 12/10/2015 Pharyngeal Phase Impaired Pharyngeal- Pudding Teaspoon -- Pharyngeal -- Pharyngeal- Pudding Cup -- Pharyngeal -- Pharyngeal- Honey Teaspoon -- Pharyngeal -- Pharyngeal- Honey Cup -- Pharyngeal -- Pharyngeal- Nectar Teaspoon -- Pharyngeal -- Pharyngeal- Nectar Cup Delayed swallow initiation-vallecula Pharyngeal -- Pharyngeal- Nectar Straw  WFL Pharyngeal -- Pharyngeal- Thin Teaspoon -- Pharyngeal -- Pharyngeal- Thin Cup Delayed swallow initiation-pyriform sinuses;Penetration/Aspiration before swallow;Moderate aspiration Pharyngeal Material enters airway, passes BELOW cords and not ejected out despite cough attempt by patient Pharyngeal- Thin Straw -- Pharyngeal -- Pharyngeal- Puree WFL Pharyngeal -- Pharyngeal- Mechanical Soft -- Pharyngeal -- Pharyngeal- Regular -- Pharyngeal -- Pharyngeal- Multi-consistency -- Pharyngeal --  Pharyngeal- Pill -- Pharyngeal -- Pharyngeal Comment --  CHL IP CERVICAL ESOPHAGEAL PHASE 12/10/2015 Cervical Esophageal Phase WFL Pudding Teaspoon -- Pudding Cup -- Honey Teaspoon -- Honey Cup -- Nectar Teaspoon -- Nectar Cup -- Nectar Straw -- Thin Teaspoon -- Thin Cup -- Thin Straw -- Puree -- Mechanical Soft -- Regular -- Multi-consistency -- Pill -- Cervical Esophageal Comment -- No flowsheet data found. Royce Macadamia 12/10/2015, 10:31 AM  Breck Coons Lonell Face.Ed CCC-SLP Pager [phone]             US Abdomen Limited Ruq  12/10/2015  CLINICAL DATA:  Abnormal LFTs. EXAM: US ABDOMEN LIMITED - RIGHT UPPER QUADRANT COMPARISON:  04/13/2015 FINDINGS: Gallbladder: Layering sludge noted in the lumen of the gallbladder. No gallbladder wall thickening or pericholecystic fluid. Sonographer reports no sonographic Murphy sign. Common bile duct: Diameter: 6 mm Liver: 4.5 by 4.3 x 4.8 cm complex cystic lesion identified in the inferior right liver, adjacent to the gallbladder. This was not visualized on the prior study. IMPRESSION: Complex cystic and solid lesion in the inferior right liver adjacent to the gallbladder. This lesion will require further imaging characterization as neoplasm could have this appearance. Consider CT imaging without and with contrast. Electronically Signed   By: Kennith Center M.D.   On: 12/10/2015 19:50    Microbiology: Recent Results (from the past 240 hour(s))  Culture, blood (routine x 2)     Status: None   Collection Time: 12/12/15 12:48 AM  Result Value Ref Range Status   Specimen Description BLOOD RIGHT ARM  Final   Special Requests AEROBIC BOTTLE ONLY  Final   Culture  Setup Time   Final    GRAM POSITIVE COCCI IN CLUSTERS AEROBIC BOTTLE ONLY CRITICAL RESULT CALLED TO, READ BACK BY AND VERIFIED WITH: M SPRAGUE,RN @0500  12/17/15 MKELLY    Culture   Final    STAPHYLOCOCCUS SPECIES (COAGULASE NEGATIVE) THE SIGNIFICANCE OF ISOLATING THIS ORGANISM FROM A SINGLE SET OF  BLOOD CULTURES WHEN MULTIPLE SETS ARE DRAWN IS UNCERTAIN. PLEASE NOTIFY THE MICROBIOLOGY DEPARTMENT WITHIN ONE WEEK IF SPECIATION AND SENSITIVITIES ARE REQUIRED.    Report Status 12/18/2015 FINAL  Final  Culture, blood (routine x 2)     Status: None   Collection Time: 12/12/15 12:54 AM  Result Value Ref Range Status   Specimen Description BLOOD RIGHT ARM  Final   Special Requests AEROBIC BOTTLE ONLY  Final   Culture NO GROWTH 5 DAYS  Final   Report Status 12/17/2015 FINAL  Final  Culture, routine-abscess     Status: None   Collection Time: 12/13/15 10:35 AM  Result Value Ref Range Status   Specimen Description ABSCESS  Final   Special Requests LIVER  Final   Gram Stain   Final    ABUNDANT WBC PRESENT,BOTH PMN AND MONONUCLEAR FEW SQUAMOUS EPITHELIAL CELLS PRESENT NO ORGANISMS SEEN Performed at [facility]    Culture   Final    MODERATE KLEBSIELLA PNEUMONIAE MODERATE ENTEROBACTER CLOACAE Performed at [facility]    Report Status 12/17/2015 FINAL  Final   Organism ID, Bacteria KLEBSIELLA PNEUMONIAE  Final  Organism ID, Bacteria ENTEROBACTER CLOACAE  Final      Susceptibility   Enterobacter cloacae - MIC*    CEFAZOLIN >=64 RESISTANT Resistant     CEFEPIME <=1 SENSITIVE Sensitive     CEFTAZIDIME >=64 RESISTANT Resistant     CEFTRIAXONE >=64 RESISTANT Resistant     CIPROFLOXACIN <=0.25 SENSITIVE Sensitive     GENTAMICIN <=1 SENSITIVE Sensitive     IMIPENEM 0.5 SENSITIVE Sensitive     PIP/TAZO >=128 RESISTANT Resistant     TOBRAMYCIN <=1 SENSITIVE Sensitive     TRIMETH/SULFA Value in next row Sensitive      <=20 SENSITIVE(NOTE)    * MODERATE ENTEROBACTER CLOACAE   Klebsiella pneumoniae - MIC*    AMPICILLIN Value in next row       <=20 SENSITIVE(NOTE)    AMPICILLIN/SULBACTAM Value in next row Sensitive      <=20 SENSITIVE(NOTE)    CEFEPIME Value in next row Sensitive      <=20 SENSITIVE(NOTE)    CEFTAZIDIME Value in next row Sensitive      <=20  SENSITIVE(NOTE)    CEFTRIAXONE Value in next row Sensitive      <=20 SENSITIVE(NOTE)    CIPROFLOXACIN Value in next row Sensitive      <=20 SENSITIVE(NOTE)    GENTAMICIN Value in next row Sensitive      <=20 SENSITIVE(NOTE)    IMIPENEM Value in next row Sensitive      <=20 SENSITIVE(NOTE)    PIP/TAZO Value in next row Sensitive      <=20 SENSITIVE(NOTE)    TOBRAMYCIN Value in next row Sensitive      <=20 SENSITIVE(NOTE)    TRIMETH/SULFA Value in next row Sensitive      <=20 SENSITIVE(NOTE)    * MODERATE KLEBSIELLA PNEUMONIAE  Culture, routine-abscess     Status: None   Collection Time: 12/15/15  4:08 PM  Result Value Ref Range Status   Specimen Description GALL BLADDER  Final   Special Requests NONE  Final   Gram Stain   Final    MODERATE WBC PRESENT,BOTH PMN AND MONONUCLEAR NO SQUAMOUS EPITHELIAL CELLS SEEN NO ORGANISMS SEEN Performed at [facility]    Culture   Final    NO GROWTH 2 DAYS Performed at [facility]    Report Status 12/18/2015 FINAL  Final  Culture, blood (routine x 2)     Status: None (Preliminary result)   Collection Time: 12/17/15 12:00 PM  Result Value Ref Range Status   Specimen Description BLOOD LEFT HAND  Final   Special Requests BOTTLES DRAWN AEROBIC ONLY 5CC  Final   Culture NO GROWTH 1 DAY  Final   Report Status PENDING  Incomplete  Culture, blood (routine x 2)     Status: None (Preliminary result)   Collection Time: 12/17/15 12:05 PM  Result Value Ref Range Status   Specimen Description BLOOD RIGHT HAND  Final   Special Requests BOTTLES DRAWN AEROBIC ONLY 3CC  Final   Culture NO GROWTH 1 DAY  Final   Report Status PENDING  Incomplete     Labs: Basic Metabolic Panel:  Recent Labs Lab 12/14/15 0214 12/15/15 0546 12/16/15 0421 12/18/15 0345 12/19/15 0240  NA 137 133* 137 139 138  K 3.8 3.8 3.4* 3.6 4.2  CL 105 105 106 104 103  CO2 21* 21* 21* 26 24  GLUCOSE 107* 112* 133* 101* 113*  BUN 7 6 5* <5* 6  CREATININE  0.53* 0.48* 0.50* 0.53* 0.54*  CALCIUM 7.7*  7.6* 7.7* 7.7* 8.1*  MG  --   --   --  2.1  --    Liver Function Tests:  Recent Labs Lab 12/14/15 0214 12/16/15 0421 12/18/15 0345  AST 45* 38 34  ALT 44 40 32  ALKPHOS 80 105 98  BILITOT 2.7* 1.5* 0.8  PROT 4.9* 5.1* 5.2*  ALBUMIN 1.6* 1.8* 1.8*   No results for input(s): LIPASE, AMYLASE in the last 168 hours.  Recent Labs Lab 12/14/15 0214 12/17/15 1147 12/18/15 0345  AMMONIA 44* 46* 27   CBC:  Recent Labs Lab 12/14/15 0214 12/15/15 0546 12/16/15 0421 12/18/15 0345 12/19/15 0240  WBC 13.7* 11.9* 13.3* 9.6 10.4  HGB 12.3* 13.2 13.2 12.8* 13.3  HCT 36.2* 38.7* 39.3 37.5* 38.2*  MCV 94.0 95.3 94.9 94.5 94.3  PLT 233 329 407* 438* 513*   Cardiac Enzymes: No results for input(s): CKTOTAL, CKMB, CKMBINDEX, TROPONINI in the last 168 hours. BNP: BNP (last 3 results)  Recent Labs  04/09/15 2133  BNP 118.4*    ProBNP (last 3 results) No results for input(s): PROBNP in the last 8760 hours.  CBG:  Recent Labs Lab 12/13/15 1303  GLUCAP 88       Signed:  Serra Younan MD, PhD  [facility] 12/19/2015, 3:10 PM

## 2015-12-19 NOTE — Care Management Note (Signed)
Case Management Note Donn Pierini RN, BSN Unit 2W-Case Manager [phone]  Patient Details  Name: Howard Rhodes MRN: [id number] Date of Birth: Aug 10, 1931  Subjective/Objective:    Pt admitted with sepsis- liver abscess and cholecystitis with drain placements-                 Action/Plan: PTA pt lived at home with wife- pt is bedbound at baseline- per conversation with wife- pt already has hospital bed at home and all other needed equipment- pt will need non emergent EMS transport home- wife states that she plans to take pt back home- has no plans for SNF reports that she can care for pt at home- pt will go home with chole drain- may need HHRN to assist with tube management at home- provided pt's wife with list of [facility] agencies for [facility].  NCM to follow for d/c needs/planning.   Expected Discharge Date:                  Expected Discharge Plan:  Skilled Nursing Facility  In-House Referral:  Clinical Social Work  Discharge planning Services  CM Consult  Post Acute Care Choice:  Home Health Choice offered to:  Spouse  DME Arranged:    DME Agency:     HH Arranged:    HH Agency:     Status of Service:  Completed, signed off  Medicare Important Message Given:  Yes Date Medicare IM Given:    Medicare IM give by:    Date Additional Medicare IM Given:    Additional Medicare Important Message give by:     If discussed at Long Length of Stay Meetings, dates discussed:  12/18/15  Discharge Disposition: skilled facility   Additional Comments:  12/19/15- 1030- [name] RN, BSN -  Spoke with MD this morning- had spoken with daughter-n-law [name]- who has concerns regarding pt going home with [facility]- call made to [facility]- who confirms that wife wants pt home with [name] however- son and daughter-n-law are POAs- and wife has dementia- son and his wife have concerns that wife will not be able to remember that pt has drains and may remove them by "accident" not meaning too- feel like  it might be better for pt to go to a STSNF while tubes remain in place then transition pt back home- pt also would have to be transported via non emergent EMS for any f/u appointments related to tubes. FIrst choice for SNF would be [facility] in [place] have consulted CSW to assist in STSNF placement.  1500- spoke with pt's wife and son at bedside- all are in agreement with pt going to STSNF- wife states she "wants one close to home" will have CSW f/u with [name] regarding placement bed options.   Darrold Span, RN 12/19/2015, 2:48 PM

## 2015-12-22 ENCOUNTER — Other Ambulatory Visit: Payer: Self-pay | Admitting: General Surgery

## 2015-12-22 DIAGNOSIS — K750 Abscess of liver: Secondary | ICD-10-CM

## 2015-12-22 LAB — CULTURE, BLOOD (ROUTINE X 2)
Culture: NO GROWTH
Culture: NO GROWTH

## 2015-12-23 DIAGNOSIS — R1310 Dysphagia, unspecified: Secondary | ICD-10-CM | POA: Diagnosis not present

## 2015-12-23 DIAGNOSIS — K810 Acute cholecystitis: Secondary | ICD-10-CM | POA: Diagnosis not present

## 2015-12-23 DIAGNOSIS — K750 Abscess of liver: Secondary | ICD-10-CM | POA: Diagnosis not present

## 2015-12-23 DIAGNOSIS — J189 Pneumonia, unspecified organism: Secondary | ICD-10-CM | POA: Diagnosis not present

## 2015-12-25 ENCOUNTER — Other Ambulatory Visit: Payer: Medicare Other

## 2015-12-25 ENCOUNTER — Inpatient Hospital Stay: Admission: RE | Admit: 2015-12-25 | Payer: Medicare Other | Source: Ambulatory Visit

## 2015-12-26 ENCOUNTER — Ambulatory Visit (HOSPITAL_COMMUNITY)
Admission: RE | Admit: 2015-12-26 | Discharge: 2015-12-26 | Disposition: A | Discharge status: Home / Self Care | Payer: Medicare Other | Source: Ambulatory Visit | Attending: General Surgery | Admitting: General Surgery

## 2015-12-26 DIAGNOSIS — K750 Abscess of liver: Secondary | ICD-10-CM | POA: Diagnosis not present

## 2015-12-26 DIAGNOSIS — I714 Abdominal aortic aneurysm, without rupture: Secondary | ICD-10-CM | POA: Diagnosis not present

## 2015-12-26 DIAGNOSIS — M4856XA Collapsed vertebra, not elsewhere classified, lumbar region, initial encounter for fracture: Secondary | ICD-10-CM | POA: Insufficient documentation

## 2015-12-26 MED ORDER — IOHEXOL 300 MG/ML  SOLN: 100.0000 mL | Freq: Once | INTRAMUSCULAR | Status: DC | PRN

## 2015-12-26 NOTE — Progress Notes (Signed)
Pt brought to hospital by PTAR for CT scan. Awake and alert. In no distress. Drains X2 to right flank area intact.

## 2016-01-02 ENCOUNTER — Other Ambulatory Visit: Payer: Self-pay | Admitting: General Surgery

## 2016-01-05 ENCOUNTER — Ambulatory Visit (HOSPITAL_COMMUNITY)
Admission: RE | Admit: 2016-01-05 | Discharge: 2016-01-05 | Disposition: A | Discharge status: Home / Self Care | Payer: Medicare Other | Source: Ambulatory Visit | Attending: General Surgery | Admitting: General Surgery

## 2016-01-05 ENCOUNTER — Other Ambulatory Visit: Payer: Self-pay | Admitting: Radiology

## 2016-01-05 ENCOUNTER — Encounter (HOSPITAL_COMMUNITY): Payer: Self-pay

## 2016-01-05 ENCOUNTER — Ambulatory Visit (HOSPITAL_COMMUNITY): Admission: RE | Admit: 2016-01-05 | Payer: Medicare Other | Source: Ambulatory Visit

## 2016-01-05 DIAGNOSIS — K750 Abscess of liver: Secondary | ICD-10-CM | POA: Diagnosis not present

## 2016-01-05 DIAGNOSIS — I77811 Abdominal aortic ectasia: Secondary | ICD-10-CM | POA: Insufficient documentation

## 2016-01-05 DIAGNOSIS — I700 Atherosclerosis of aorta: Secondary | ICD-10-CM | POA: Insufficient documentation

## 2016-01-05 DIAGNOSIS — K810 Acute cholecystitis: Secondary | ICD-10-CM

## 2016-01-05 DIAGNOSIS — I2510 Atherosclerotic heart disease of native coronary artery without angina pectoris: Secondary | ICD-10-CM | POA: Insufficient documentation

## 2016-01-05 MED ORDER — IOHEXOL 300 MG/ML  SOLN
80.0000 mL | Freq: Once | INTRAMUSCULAR | Status: AC | PRN
  Administered 2016-01-05: 80 mL via INTRAVENOUS

## 2016-01-05 NOTE — Progress Notes (Signed)
Report to RN at rehab facility, denies questions

## 2016-01-06 ENCOUNTER — Other Ambulatory Visit: Payer: Self-pay | Admitting: General Surgery

## 2016-01-06 DIAGNOSIS — K810 Acute cholecystitis: Secondary | ICD-10-CM

## 2016-01-06 DIAGNOSIS — K750 Abscess of liver: Secondary | ICD-10-CM

## 2016-01-19 DIAGNOSIS — R748 Abnormal levels of other serum enzymes: Secondary | ICD-10-CM | POA: Insufficient documentation

## 2016-01-22 ENCOUNTER — Other Ambulatory Visit: Payer: Medicare Other

## 2016-01-27 DIAGNOSIS — K810 Acute cholecystitis: Secondary | ICD-10-CM | POA: Diagnosis not present

## 2016-01-27 DIAGNOSIS — K750 Abscess of liver: Secondary | ICD-10-CM | POA: Diagnosis not present

## 2016-01-28 ENCOUNTER — Other Ambulatory Visit (HOSPITAL_COMMUNITY): Payer: Self-pay | Admitting: General Surgery

## 2016-01-28 DIAGNOSIS — K750 Abscess of liver: Secondary | ICD-10-CM

## 2016-01-28 DIAGNOSIS — K810 Acute cholecystitis: Secondary | ICD-10-CM

## 2016-01-29 ENCOUNTER — Other Ambulatory Visit (HOSPITAL_COMMUNITY): Payer: Self-pay | Admitting: General Surgery

## 2016-01-29 DIAGNOSIS — K810 Acute cholecystitis: Secondary | ICD-10-CM

## 2016-01-29 DIAGNOSIS — K750 Abscess of liver: Secondary | ICD-10-CM

## 2016-01-30 ENCOUNTER — Ambulatory Visit (HOSPITAL_COMMUNITY)
Admission: RE | Admit: 2016-01-30 | Discharge: 2016-01-30 | Disposition: A | Discharge status: Home / Self Care | Payer: Medicare Other | Source: Ambulatory Visit | Attending: General Surgery | Admitting: General Surgery

## 2016-01-30 ENCOUNTER — Other Ambulatory Visit (HOSPITAL_COMMUNITY): Payer: Self-pay | Admitting: General Surgery

## 2016-01-30 DIAGNOSIS — K750 Abscess of liver: Secondary | ICD-10-CM

## 2016-01-30 DIAGNOSIS — K810 Acute cholecystitis: Secondary | ICD-10-CM | POA: Diagnosis not present

## 2016-01-30 DIAGNOSIS — Z4803 Encounter for change or removal of drains: Secondary | ICD-10-CM | POA: Insufficient documentation

## 2016-01-30 DIAGNOSIS — Z434 Encounter for attention to other artificial openings of digestive tract: Secondary | ICD-10-CM | POA: Diagnosis not present

## 2016-01-30 MED ORDER — IOPAMIDOL (ISOVUE-300) INJECTION 61%
INTRAVENOUS | Status: AC
  Filled 2016-01-30: qty 75

## 2016-01-30 MED ORDER — IOPAMIDOL (ISOVUE-300) INJECTION 61%
INTRAVENOUS | Status: AC
  Filled 2016-01-30: qty 50

## 2016-01-30 MED ORDER — LIDOCAINE HCL 1 % IJ SOLN
INTRAMUSCULAR | Status: AC
  Filled 2016-01-30: qty 20

## 2016-01-30 NOTE — Procedures (Signed)
Cholangiogram thru existing drain.  Cystic duct and CBD are patent.  Sheath cholangiogram demonstrated a formed tract.  The cholecystostomy tube was completely removed.  The abscess drain was also removed.

## 2016-02-03 DIAGNOSIS — L89524 Pressure ulcer of left ankle, stage 4: Secondary | ICD-10-CM | POA: Diagnosis not present

## 2016-02-03 DIAGNOSIS — L89513 Pressure ulcer of right ankle, stage 3: Secondary | ICD-10-CM | POA: Diagnosis not present

## 2016-02-03 DIAGNOSIS — L259 Unspecified contact dermatitis, unspecified cause: Secondary | ICD-10-CM | POA: Diagnosis not present

## 2016-02-10 DIAGNOSIS — L259 Unspecified contact dermatitis, unspecified cause: Secondary | ICD-10-CM | POA: Diagnosis not present

## 2016-02-10 DIAGNOSIS — L89513 Pressure ulcer of right ankle, stage 3: Secondary | ICD-10-CM | POA: Diagnosis not present

## 2016-02-10 DIAGNOSIS — L89624 Pressure ulcer of left heel, stage 4: Secondary | ICD-10-CM | POA: Diagnosis not present

## 2016-02-11 DIAGNOSIS — L89524 Pressure ulcer of left ankle, stage 4: Secondary | ICD-10-CM | POA: Diagnosis not present

## 2016-02-11 DIAGNOSIS — L89613 Pressure ulcer of right heel, stage 3: Secondary | ICD-10-CM | POA: Diagnosis not present

## 2016-02-17 DIAGNOSIS — L89624 Pressure ulcer of left heel, stage 4: Secondary | ICD-10-CM | POA: Diagnosis not present

## 2016-02-17 DIAGNOSIS — L89513 Pressure ulcer of right ankle, stage 3: Secondary | ICD-10-CM | POA: Diagnosis not present

## 2016-02-17 DIAGNOSIS — L259 Unspecified contact dermatitis, unspecified cause: Secondary | ICD-10-CM | POA: Diagnosis not present

## 2016-02-24 DIAGNOSIS — L89513 Pressure ulcer of right ankle, stage 3: Secondary | ICD-10-CM | POA: Diagnosis not present

## 2016-02-24 DIAGNOSIS — L259 Unspecified contact dermatitis, unspecified cause: Secondary | ICD-10-CM | POA: Diagnosis not present

## 2016-02-24 DIAGNOSIS — L89624 Pressure ulcer of left heel, stage 4: Secondary | ICD-10-CM | POA: Diagnosis not present

## 2016-03-02 DIAGNOSIS — R531 Weakness: Secondary | ICD-10-CM | POA: Diagnosis not present

## 2016-03-02 DIAGNOSIS — L259 Unspecified contact dermatitis, unspecified cause: Secondary | ICD-10-CM | POA: Diagnosis not present

## 2016-03-02 DIAGNOSIS — L89624 Pressure ulcer of left heel, stage 4: Secondary | ICD-10-CM | POA: Diagnosis not present

## 2016-03-02 DIAGNOSIS — L89513 Pressure ulcer of right ankle, stage 3: Secondary | ICD-10-CM | POA: Diagnosis not present

## 2016-03-02 DIAGNOSIS — Z743 Need for continuous supervision: Secondary | ICD-10-CM | POA: Diagnosis not present

## 2016-03-17 DIAGNOSIS — L89890 Pressure ulcer of other site, unstageable: Secondary | ICD-10-CM | POA: Diagnosis not present

## 2016-03-17 DIAGNOSIS — L89621 Pressure ulcer of left heel, stage 1: Secondary | ICD-10-CM | POA: Diagnosis not present

## 2016-03-17 DIAGNOSIS — L89513 Pressure ulcer of right ankle, stage 3: Secondary | ICD-10-CM | POA: Diagnosis not present

## 2016-03-17 DIAGNOSIS — L89151 Pressure ulcer of sacral region, stage 1: Secondary | ICD-10-CM | POA: Diagnosis not present

## 2016-03-17 DIAGNOSIS — K5909 Other constipation: Secondary | ICD-10-CM | POA: Diagnosis not present

## 2016-04-01 DIAGNOSIS — M19049 Primary osteoarthritis, unspecified hand: Secondary | ICD-10-CM | POA: Diagnosis not present

## 2016-04-01 DIAGNOSIS — L89513 Pressure ulcer of right ankle, stage 3: Secondary | ICD-10-CM | POA: Diagnosis not present

## 2016-04-01 DIAGNOSIS — G250 Essential tremor: Secondary | ICD-10-CM | POA: Diagnosis not present

## 2016-04-01 DIAGNOSIS — M21372 Foot drop, left foot: Secondary | ICD-10-CM | POA: Diagnosis not present

## 2016-04-01 DIAGNOSIS — L89890 Pressure ulcer of other site, unstageable: Secondary | ICD-10-CM | POA: Diagnosis not present

## 2016-04-01 DIAGNOSIS — Z7401 Bed confinement status: Secondary | ICD-10-CM | POA: Diagnosis not present

## 2016-04-01 DIAGNOSIS — Z9181 History of falling: Secondary | ICD-10-CM | POA: Diagnosis not present

## 2016-04-01 DIAGNOSIS — R7303 Prediabetes: Secondary | ICD-10-CM | POA: Diagnosis not present

## 2016-04-05 DIAGNOSIS — M21372 Foot drop, left foot: Secondary | ICD-10-CM | POA: Diagnosis not present

## 2016-04-05 DIAGNOSIS — Z9181 History of falling: Secondary | ICD-10-CM | POA: Diagnosis not present

## 2016-04-05 DIAGNOSIS — Z7401 Bed confinement status: Secondary | ICD-10-CM | POA: Diagnosis not present

## 2016-04-05 DIAGNOSIS — L89513 Pressure ulcer of right ankle, stage 3: Secondary | ICD-10-CM | POA: Diagnosis not present

## 2016-04-05 DIAGNOSIS — L89890 Pressure ulcer of other site, unstageable: Secondary | ICD-10-CM | POA: Diagnosis not present

## 2016-04-05 DIAGNOSIS — R7303 Prediabetes: Secondary | ICD-10-CM | POA: Diagnosis not present

## 2016-04-05 DIAGNOSIS — G250 Essential tremor: Secondary | ICD-10-CM | POA: Diagnosis not present

## 2016-04-05 DIAGNOSIS — M19049 Primary osteoarthritis, unspecified hand: Secondary | ICD-10-CM | POA: Diagnosis not present

## 2016-04-09 DIAGNOSIS — L89513 Pressure ulcer of right ankle, stage 3: Secondary | ICD-10-CM | POA: Diagnosis not present

## 2016-04-09 DIAGNOSIS — G250 Essential tremor: Secondary | ICD-10-CM | POA: Diagnosis not present

## 2016-04-09 DIAGNOSIS — R7303 Prediabetes: Secondary | ICD-10-CM | POA: Diagnosis not present

## 2016-04-09 DIAGNOSIS — Z9181 History of falling: Secondary | ICD-10-CM | POA: Diagnosis not present

## 2016-04-09 DIAGNOSIS — M21372 Foot drop, left foot: Secondary | ICD-10-CM | POA: Diagnosis not present

## 2016-04-09 DIAGNOSIS — Z7401 Bed confinement status: Secondary | ICD-10-CM | POA: Diagnosis not present

## 2016-04-09 DIAGNOSIS — L89890 Pressure ulcer of other site, unstageable: Secondary | ICD-10-CM | POA: Diagnosis not present

## 2016-04-09 DIAGNOSIS — M19049 Primary osteoarthritis, unspecified hand: Secondary | ICD-10-CM | POA: Diagnosis not present

## 2016-04-12 DIAGNOSIS — R7303 Prediabetes: Secondary | ICD-10-CM | POA: Diagnosis not present

## 2016-04-12 DIAGNOSIS — M19049 Primary osteoarthritis, unspecified hand: Secondary | ICD-10-CM | POA: Diagnosis not present

## 2016-04-12 DIAGNOSIS — L89513 Pressure ulcer of right ankle, stage 3: Secondary | ICD-10-CM | POA: Diagnosis not present

## 2016-04-12 DIAGNOSIS — L89890 Pressure ulcer of other site, unstageable: Secondary | ICD-10-CM | POA: Diagnosis not present

## 2016-04-12 DIAGNOSIS — Z7401 Bed confinement status: Secondary | ICD-10-CM | POA: Diagnosis not present

## 2016-04-12 DIAGNOSIS — Z9181 History of falling: Secondary | ICD-10-CM | POA: Diagnosis not present

## 2016-04-12 DIAGNOSIS — M21372 Foot drop, left foot: Secondary | ICD-10-CM | POA: Diagnosis not present

## 2016-04-12 DIAGNOSIS — G250 Essential tremor: Secondary | ICD-10-CM | POA: Diagnosis not present

## 2016-04-21 DIAGNOSIS — Z9181 History of falling: Secondary | ICD-10-CM | POA: Diagnosis not present

## 2016-04-21 DIAGNOSIS — M21372 Foot drop, left foot: Secondary | ICD-10-CM | POA: Diagnosis not present

## 2016-04-21 DIAGNOSIS — L89513 Pressure ulcer of right ankle, stage 3: Secondary | ICD-10-CM | POA: Diagnosis not present

## 2016-04-21 DIAGNOSIS — G250 Essential tremor: Secondary | ICD-10-CM | POA: Diagnosis not present

## 2016-04-21 DIAGNOSIS — L89890 Pressure ulcer of other site, unstageable: Secondary | ICD-10-CM | POA: Diagnosis not present

## 2016-04-21 DIAGNOSIS — M19049 Primary osteoarthritis, unspecified hand: Secondary | ICD-10-CM | POA: Diagnosis not present

## 2016-04-21 DIAGNOSIS — Z7401 Bed confinement status: Secondary | ICD-10-CM | POA: Diagnosis not present

## 2016-04-21 DIAGNOSIS — R7303 Prediabetes: Secondary | ICD-10-CM | POA: Diagnosis not present

## 2016-04-28 DIAGNOSIS — Z7401 Bed confinement status: Secondary | ICD-10-CM | POA: Diagnosis not present

## 2016-04-28 DIAGNOSIS — R7303 Prediabetes: Secondary | ICD-10-CM | POA: Diagnosis not present

## 2016-04-28 DIAGNOSIS — L89513 Pressure ulcer of right ankle, stage 3: Secondary | ICD-10-CM | POA: Diagnosis not present

## 2016-04-28 DIAGNOSIS — L89890 Pressure ulcer of other site, unstageable: Secondary | ICD-10-CM | POA: Diagnosis not present

## 2016-04-28 DIAGNOSIS — M19049 Primary osteoarthritis, unspecified hand: Secondary | ICD-10-CM | POA: Diagnosis not present

## 2016-04-28 DIAGNOSIS — G250 Essential tremor: Secondary | ICD-10-CM | POA: Diagnosis not present

## 2016-04-28 DIAGNOSIS — M21372 Foot drop, left foot: Secondary | ICD-10-CM | POA: Diagnosis not present

## 2016-04-28 DIAGNOSIS — Z9181 History of falling: Secondary | ICD-10-CM | POA: Diagnosis not present

## 2016-05-03 DIAGNOSIS — G250 Essential tremor: Secondary | ICD-10-CM | POA: Diagnosis not present

## 2016-05-03 DIAGNOSIS — Z7401 Bed confinement status: Secondary | ICD-10-CM | POA: Diagnosis not present

## 2016-05-03 DIAGNOSIS — L89890 Pressure ulcer of other site, unstageable: Secondary | ICD-10-CM | POA: Diagnosis not present

## 2016-05-03 DIAGNOSIS — M21372 Foot drop, left foot: Secondary | ICD-10-CM | POA: Diagnosis not present

## 2016-05-03 DIAGNOSIS — M19049 Primary osteoarthritis, unspecified hand: Secondary | ICD-10-CM | POA: Diagnosis not present

## 2016-05-03 DIAGNOSIS — R7303 Prediabetes: Secondary | ICD-10-CM | POA: Diagnosis not present

## 2016-05-03 DIAGNOSIS — L89513 Pressure ulcer of right ankle, stage 3: Secondary | ICD-10-CM | POA: Diagnosis not present

## 2016-05-03 DIAGNOSIS — Z9181 History of falling: Secondary | ICD-10-CM | POA: Diagnosis not present

## 2017-02-24 IMAGING — CT CT CHEST W/O CM
2 of 3 series · 15 of 36 positions shown, 18 images · non-contrast
Comparison: Chest radiograph dated 04/21/2015. CT chest dated
04/13/2013.

CLINICAL DATA: Fever, shortness of breath, possible pneumonia

EXAM:
CT CHEST WITHOUT CONTRAST
TECHNIQUE: Multidetector CT imaging of the chest was performed following the
standard protocol without IV contrast.

[Series 201: chest without, idose (2) · axial · non-contrast · 0.68mm/px · z∈[+97,+287]mm · 12 of 46 slices shown, 15 images]
[im 4/46  mediastinal]
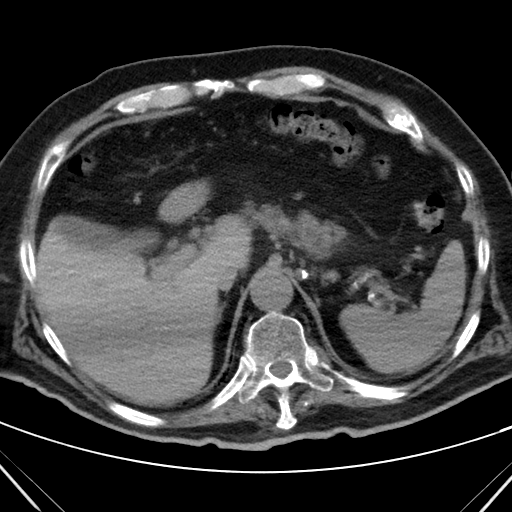
[im 4/46  lung]
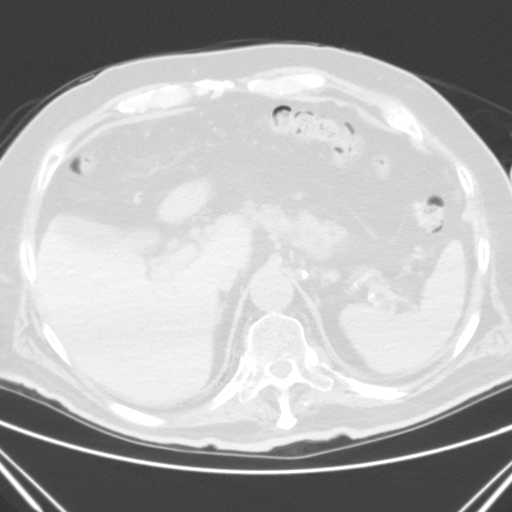
[im 7/46  lung]
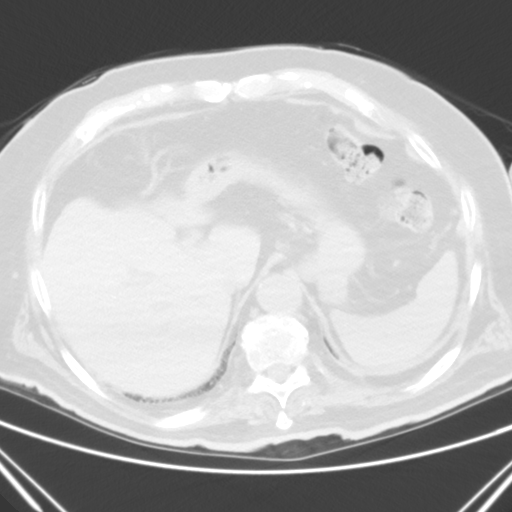
[im 11/46  lung]
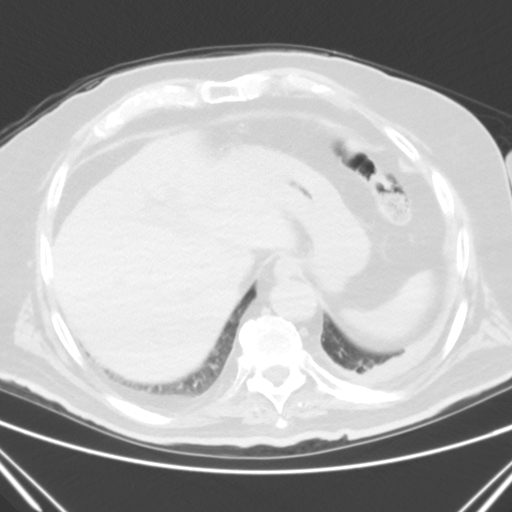
[im 14/46  lung]
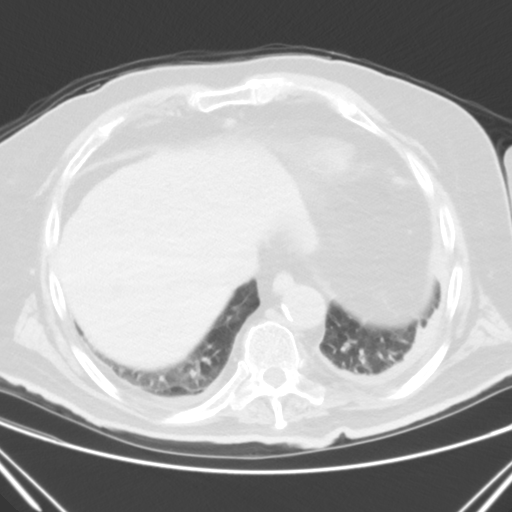
[im 17/46  mediastinal]
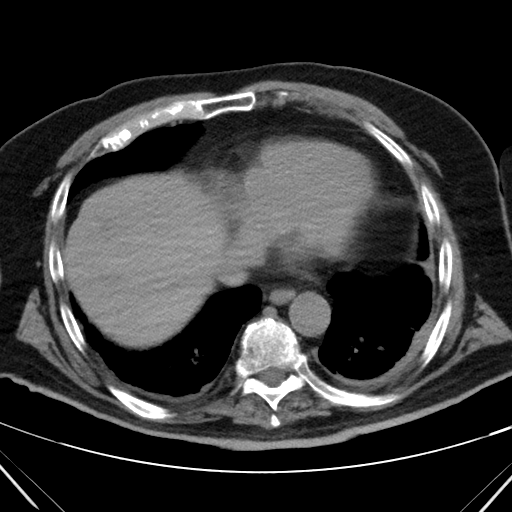
[im 17/46  lung]
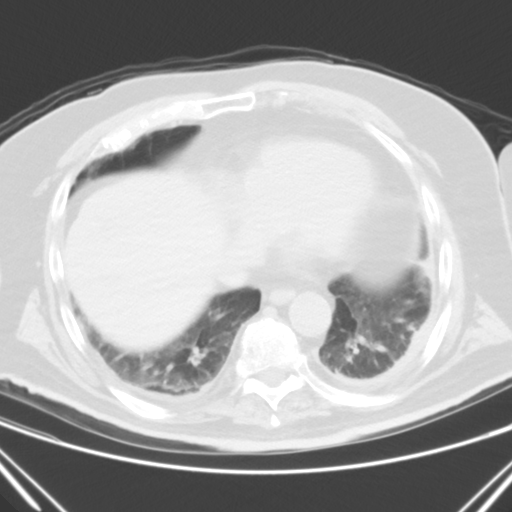
[im 21/46  lung]
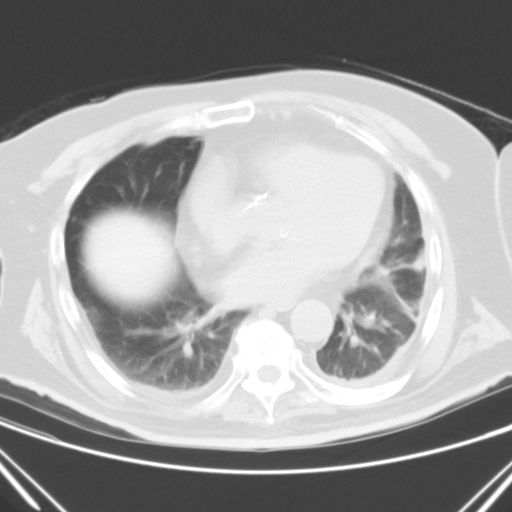
[im 26/46  lung]
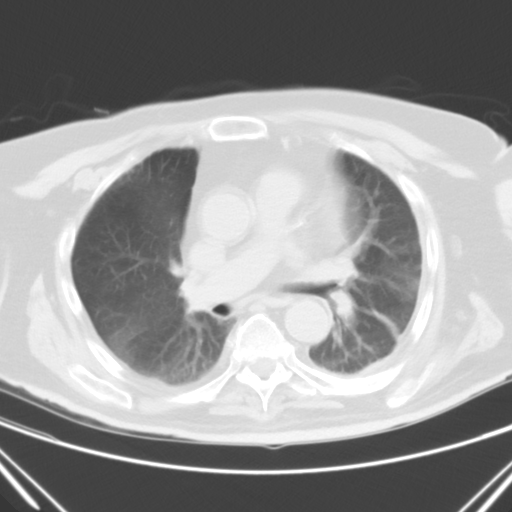
[im 29/46  lung]
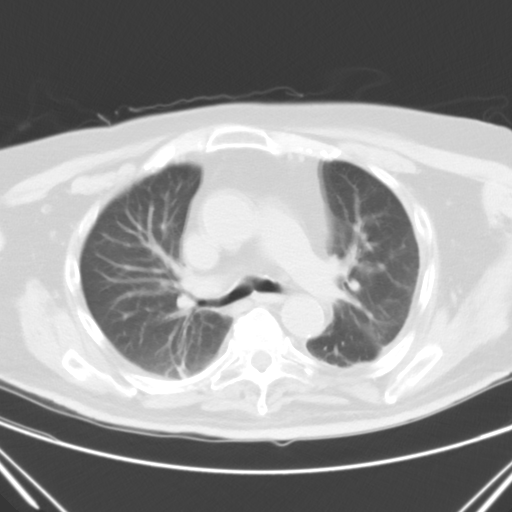
[im 32/46  mediastinal]
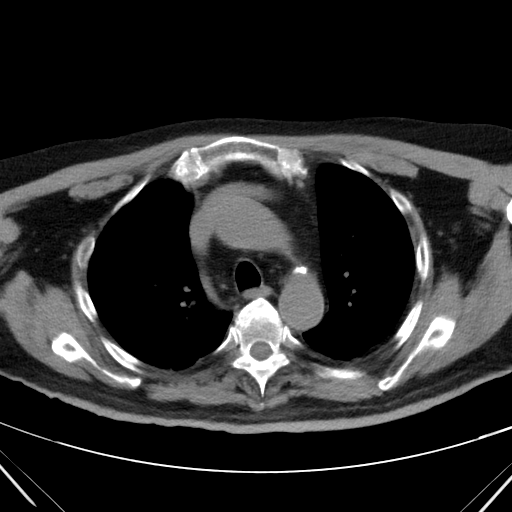
[im 32/46  lung]
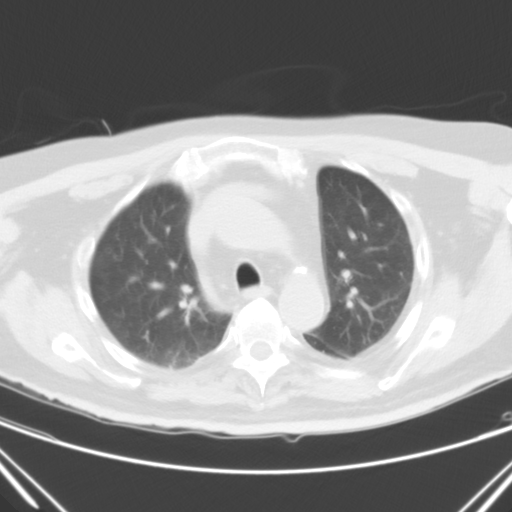
[im 36/46  lung]
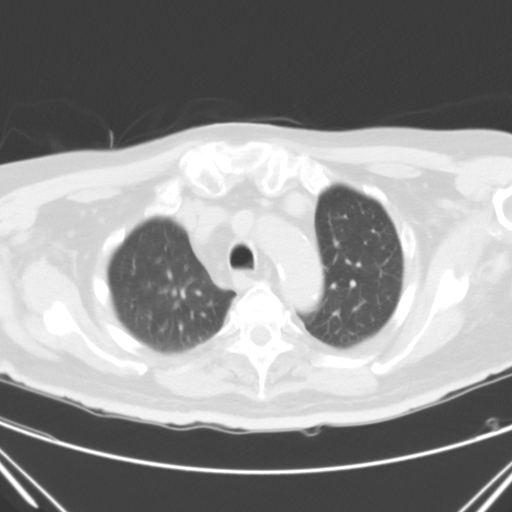
[im 39/46  lung]
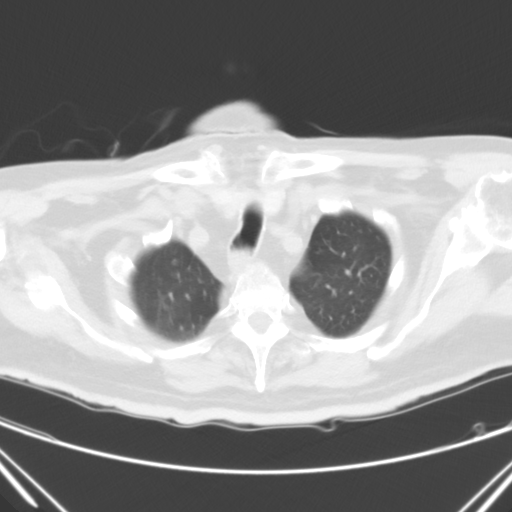
[im 42/46  lung]
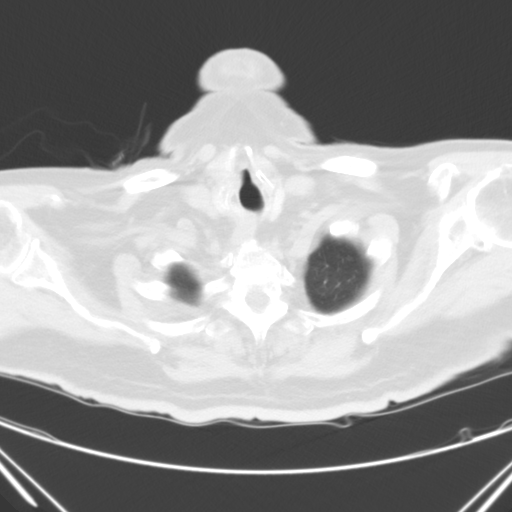

[Series 203: coronal, idose (2) · coronal · 0.45mm/px · 3 of 116 slices shown]
[im 24/116  lung]
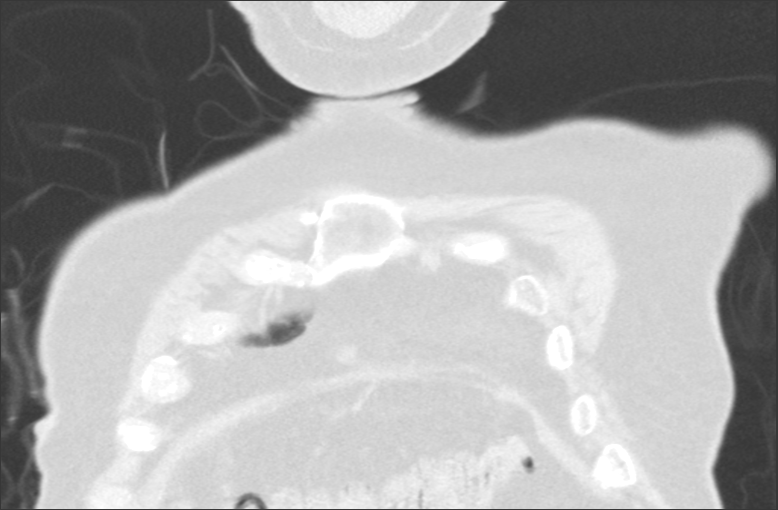
[im 47/116  lung]
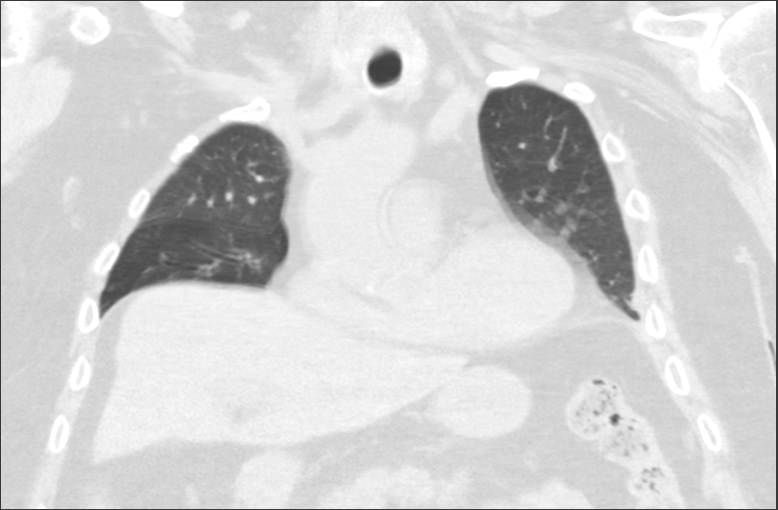
[im 70/116  lung]
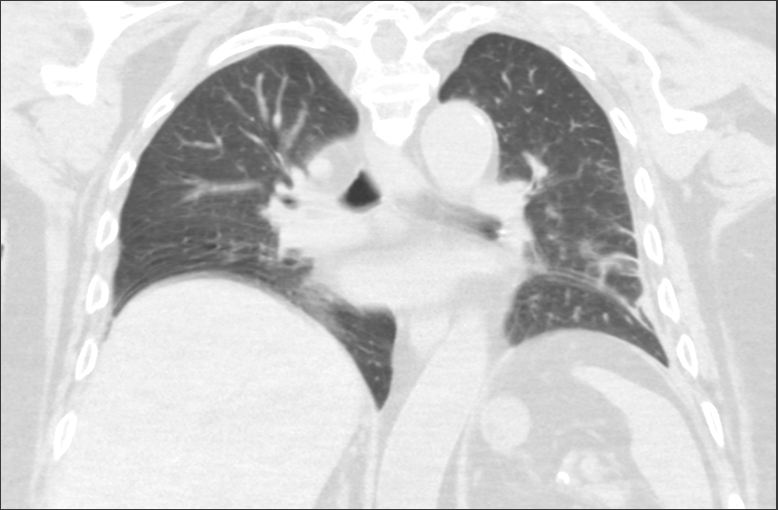

[15 of 36 positions shown; findings below may reference images not displayed]

FINDINGS: Mediastinum/Nodes: The heart is normal in size. No pericardial
effusion.

Coronary atherosclerosis.

Atherosclerotic calcifications of the aortic arch.

No suspicious mediastinal, hilar, or axillary lymphadenopathy.

Visualized thyroid is unremarkable.

Lungs/Pleura: Evaluation of lung parenchyma is constrained by
respiratory motion.

No suspicious pulmonary nodules.

Mild linear scarring/atelectasis in the lingula and left lower lobe.
No focal consolidation.

Small left pleural effusion, possibly mildly complex/hemorrhagic.
Trace right pleural effusion. No pneumothorax.

Upper abdomen: Visualized upper abdomen is notable for vascular
calcifications.

Musculoskeletal: Degenerative changes of the visualized
thoracolumbar spine.

Mild superior endplate compression fracture deformity at T4 and T9.
IMPRESSION: Mild linear scarring/atelectasis in the lingula and left lower lobe.
No focal consolidation.

Small left pleural effusion, possibly mildly complex/hemorrhagic.
Trace right pleural effusion.

## 2017-12-23 DEATH — deceased
